# Patient Record
Sex: Male | Born: 1980
Health system: Southern US, Community
[De-identification: ages and names within clinical notes are randomized; demographics above are authoritative.]

## PROBLEM LIST (undated history)

## (undated) DIAGNOSIS — K219 Gastro-esophageal reflux disease without esophagitis: Secondary | ICD-10-CM

## (undated) DIAGNOSIS — F909 Attention-deficit hyperactivity disorder, unspecified type: Secondary | ICD-10-CM

## (undated) DIAGNOSIS — F419 Anxiety disorder, unspecified: Secondary | ICD-10-CM

## (undated) DIAGNOSIS — Z973 Presence of spectacles and contact lenses: Secondary | ICD-10-CM

## (undated) HISTORY — DX: Attention-deficit hyperactivity disorder, unspecified type: F90.9

## (undated) HISTORY — DX: Gastro-esophageal reflux disease without esophagitis: K21.9

## (undated) HISTORY — DX: Anxiety disorder, unspecified: F41.9

## (undated) HISTORY — PX: WISDOM TOOTH EXTRACTION: SHX21

---

## 2011-06-13 HISTORY — PX: SEPTOPLASTY: SUR1290

## 2017-04-12 ENCOUNTER — Encounter: Payer: Self-pay | Admitting: Family Medicine

## 2017-04-12 ENCOUNTER — Ambulatory Visit (INDEPENDENT_AMBULATORY_CARE_PROVIDER_SITE_OTHER): Payer: 59 | Admitting: Family Medicine

## 2017-04-12 VITALS — BP 115/64 | HR 88 | Temp 98.2°F | Resp 16 | Ht 66.0 in | Wt 193.8 lb

## 2017-04-12 DIAGNOSIS — J3089 Other allergic rhinitis: Secondary | ICD-10-CM

## 2017-04-12 DIAGNOSIS — K219 Gastro-esophageal reflux disease without esophagitis: Secondary | ICD-10-CM | POA: Diagnosis not present

## 2017-04-12 DIAGNOSIS — Z7689 Persons encountering health services in other specified circumstances: Secondary | ICD-10-CM

## 2017-04-12 DIAGNOSIS — J302 Other seasonal allergic rhinitis: Secondary | ICD-10-CM

## 2017-04-12 DIAGNOSIS — J309 Allergic rhinitis, unspecified: Secondary | ICD-10-CM | POA: Insufficient documentation

## 2017-04-12 DIAGNOSIS — Z9889 Other specified postprocedural states: Secondary | ICD-10-CM | POA: Insufficient documentation

## 2017-04-12 MED ORDER — FLUTICASONE PROPIONATE 50 MCG/ACT NA SUSP: 2.0000 | Freq: Every day | NASAL | 3 refills | Status: DC

## 2017-04-12 MED ORDER — OMEPRAZOLE 20 MG PO CPDR: 20.0000 mg | DELAYED_RELEASE_CAPSULE | Freq: Every day | ORAL | 5 refills | Status: DC

## 2017-04-12 NOTE — Assessment & Plan Note (Signed)
Stable with some gradual change s/p post op septoplasty for deviated septum [facility]

## 2017-04-12 NOTE — Assessment & Plan Note (Signed)
Suspected chronic contributing problem to his rhinitis, post nasal drainage and possibly related to his LPR  Plan: 1. Loratadine seems ineffective - switch to other 2nd gen anti-histamine, Zyrtec or Allegra (or generics OTC) daily 2. Start nasal steroid Flonase 2 sprays in each nostril daily for 4-6 weeks, may repeat course seasonally or as needed - need to use saline mist or spray or flush if using steroid to avoid bleeding/dryness - May need to switch nasal steroid to nasal anti-histamine in future if intolerance 

## 2017-04-12 NOTE — Assessment & Plan Note (Signed)
Suspected combination of silent reflux with laryngopharyngeal reflux and some mild GERD symptoms. Concern with chronic voice change and chronic post nasal drainage and phlegm throat clearing with heartburn after any type of PO at times - Primary risk includes former smoker, now E-cig user - No GI red flag symptoms (prior history of dysphagia resolved, prior eval by ENT direct laryngoscopy) - Inadequate trial of OTC H2 blocker zantac unsure dose x 1 daily for 1 month some improvement mixed results  Plan: 1. Start rx Omeprazole 20mg  daily 30 min prior to 1st meal for 4 weeks then notify office with update, most likely will need longer trial up to 3 months based on chronic LPR symptoms 2. Diet modifications reduce GERD 3. Follow-up 4 weeks as needed or if improved can return in 3 months, may consider prolonged course vs double dose for 40mg  vs BID - review taper off in future to avoid rebound - future if still problems can consider referral to GI

## 2017-04-12 NOTE — Assessment & Plan Note (Signed)
Suspected chronic contributing problem to his rhinitis, post nasal drainage and possibly related to his LPR  Plan: 1. Loratadine seems ineffective - switch to other 2nd gen anti-histamine, Zyrtec or Allegra (or generics OTC) daily 2. Start nasal steroid Flonase 2 sprays in each nostril daily for 4-6 weeks, may repeat course seasonally or as needed - need to use saline mist or spray or flush if using steroid to avoid bleeding/dryness - May need to switch nasal steroid to nasal anti-histamine in future if intolerance

## 2017-04-12 NOTE — Assessment & Plan Note (Signed)
Suspected combination of silent reflux with laryngopharyngeal reflux and some mild GERD symptoms. Concern with chronic voice change and chronic post nasal drainage and phlegm throat clearing with heartburn after any type of PO at times - Primary risk includes former smoker, now E-cig user - No GI red flag symptoms (prior history of dysphagia resolved, prior eval by ENT direct laryngoscopy) - Inadequate trial of OTC H2 blocker zantac unsure dose x 1 daily for 1 month some improvement mixed results  Plan: 1. Start rx Omeprazole 20mg daily 30 min prior to 1st meal for 4 weeks then notify office with update, most likely will need longer trial up to 3 months based on chronic LPR symptoms 2. Diet modifications reduce GERD 3. Follow-up 4 weeks as needed or if improved can return in 3 months, may consider prolonged course vs double dose for 40mg vs BID - review taper off in future to avoid rebound - future if still problems can consider referral to GI 

## 2017-04-12 NOTE — Patient Instructions (Addendum)
Thank you for coming to the clinic today.  1. Your symptoms sound most consistent with Silent Reflux or (Laryngopharyngeal Reflux), this is similar to Acid Reflux (or GERD) but usually involves the Throat and has a variety of symptoms including a "globus sensation", or air bubble or pressure in throat. Commonly occurs in patients who have had some symptoms of traditional heartburn before, but this can occur even when not eating spicy foods or any other foods can trigger it like you mentioned  - Start Omeprazole 20mg  - take one capsule 30 min before first meal of day, same time every day for at least 4 weeks, given the duration of your symptoms, I think this is reasonable to continue for likely up to 3 months until you return to office  - Avoid spicy, greasy, fried foods, also things like caffeine, dark chocolate, peppermint can worsen - Avoid large meals and late night snacks, also do not go more than 4-5 hours without a snack or meal (not eating will worsen reflux symptoms due to stomach acid)  If the problem improves but keeps coming back, we can discuss higher dose or longer course at next visit.  If symptoms are worsening, persistent symptoms despite treatment or develop esophageal or abdominal pain, unable to swallow solids or liquids, nausea, vomiting, fever/chills, or unintentional weight loss / no appetite, please follow-up sooner or seek more immediate medical attention.  For sinuses  Start nasal steroid Flonase 2 sprays in each nostril daily for 4-6 weeks, may repeat course seasonally or as needed  STOP Loratadine (Claritin) switch to OTC Zyrtec or Allegra once daily  ------------------- Please send us a copy of your [facility]Chart [facility] lab results  We will request records from   [facility] [address] [place], [place] [address] Main Phone: [phone] Fax: [phone]  Please schedule a Follow-up Appointment to: Return in about 3 months (around 07/13/2017) for GERD /  Rhinitis sinuses.  If you have any other questions or concerns, please feel free to call the clinic or send a message through MyChart. You may also schedule an earlier appointment if necessary.  Additionally, you may be receiving a survey about your experience at our clinic within a few days to 1 week by e-mail or mail. We value your feedback.  Saralyn PilarAlexander Earnie Bechard, DO [facility]

## 2017-04-12 NOTE — Progress Notes (Signed)
Subjective:    Patient ID: Robert [name], male    DOB: 08-17-1980, 36 y.o.   MRN: [id number]  Robert [name] is a 36 y.o. male presenting on 04/12/2017 for Establish Care (GERD, Allergies ear pressure and dizziness after work out.)  [facility] in [place], last visit >1 year ago, completed all blood work and evaluation. He is establishing now with new PCP here after changing jobs and with new insurance plan and relocated to [place].  HPI   GERD vs LPR - Reports history onset 3-4 years ago onset of symptoms with dietary/GERD (previously could eat most foods, chocolate, coffee, dairy without problem), describes of difficulty swallowing and choking on food, also with associated heartburn, he initially was referred to [facility], had a direct laryngoscopy, confirmed some irritation of throat was dx with LPR silent reflux, initially treated with Zantac 150mg  once daily not taking twice daily, seemed to feel better for a while, he stopped taking it regularly >2-3 years ago, and now he only takes it PRN, seems to benefit when he does take it PRN and helps "clear his throat", he has never taken PPI medicines. - Describes most foods and liquids seem to provoke his symptoms. He tries to avoid most trigger foods spicy etc, but seems to be caused by even water. Worse when laying down at night, will have esophageal or upper heartburn burning symptoms, less when upright. - Currently taking 1000mg  TUMs with some relief temporarily  Deviated septum, S/p Septoplasty - Reports had chronic history of nasal difficulties, he required surgery with deviated septum and septoplasty by [facility] - He does admit some "shifting" and slight change but has been in communication with ENT and no changes needed now  Seasonal Allergies / Allergic Rhinitis Reports chronic problem, worst seasons summer/fall - He took claritin 10mg  OTC for 3-4 days then would get some sinus pressure, and nasal congestion - Most annoying thing with  difficulty breathing through nose, and will get phlegm in back of throat, post nasal drainage, thicker sticky phlegm - Uses nasal wash regularly  Lifestyle: - Fluctuating weight over past 5 months due to lifestyle changes - Diet: Follows shake program isogenic, dairy free, higher protein, smaller carb/veggie portion, drinks only water - Exercise: Previously working night shift, he would work out at [employer], now more difficult to keep up with gym now while working on day shift. He does work out 1-3 x weekly, regular weight training 10 min intense cardio machine, weight lifting  History of Tobacco Abuse - Reports prior smoker 1ppd for 15 years, Quit smoking cigs 10/2016. Social vaping now  Health Maintenance: - Due for Flu Shot, would return soon, declines today despite counseling on benefits - Will check CareEverywhere/outside PCP records pending for TDap and routine HIV screen  Depression screen PHQ 2/9 04/12/2017  Decreased Interest 0  Down, Depressed, Hopeless 0  PHQ - 2 Score 0    Past Medical History:  Diagnosis Date  . ADHD   . GERD (gastroesophageal reflux disease)    Past Surgical History:  Procedure Laterality Date  . SEPTOPLASTY  06/13/2011   Social History   Social History  . Marital status: Married    Spouse name: N/A  . Number of children: 1  . Years of education: Vocational   Occupational History  . IT     Commuting to [place]   Social History Main Topics  . Smoking status: Former Smoker    Packs/day: 1.00    Years: 15.00  Types: Cigarettes    Quit date: 10/10/2016  . Smokeless tobacco: Former Neurosurgeon     Comment: Quit using vaping  . Alcohol use No  . Drug use: No  . Sexual activity: Not on file   Other Topics Concern  . Not on file   Social History Narrative  . No narrative on file   Family History  Problem Relation Age of Onset  . Cancer Maternal Grandfather 84       stomach  . Esophageal cancer Maternal Grandfather 34  . Colon  cancer Neg Hx   . Prostate cancer Neg Hx   . Diabetes Neg Hx   . Hypertension Neg Hx   . Hypercholesterolemia Neg Hx    No current outpatient prescriptions on file prior to visit.   No current facility-administered medications on file prior to visit.     Review of Systems  Constitutional: Negative for activity change, appetite change, chills, diaphoresis, fatigue, fever and unexpected weight change.  HENT: Positive for congestion, postnasal drip and voice change. Negative for hearing loss and sinus pressure.   Eyes: Negative for discharge, itching and visual disturbance.  Respiratory: Negative for apnea, cough, choking, chest tightness, shortness of breath and wheezing.   Cardiovascular: Negative for chest pain, palpitations and leg swelling.  Gastrointestinal: Negative for abdominal pain, anal bleeding, blood in stool, constipation, diarrhea, nausea and vomiting.       Heartburn  Endocrine: Negative for cold intolerance and polyuria.  Genitourinary: Negative for decreased urine volume, difficulty urinating, dysuria, frequency, hematuria and testicular pain.  Musculoskeletal: Negative for arthralgias and neck pain.  Skin: Negative for rash.  Allergic/Immunologic: Positive for environmental allergies.  Neurological: Negative for dizziness, weakness, light-headedness, numbness and headaches.  Hematological: Negative for adenopathy.  Psychiatric/Behavioral: Negative for behavioral problems, dysphoric mood and sleep disturbance. The patient is not nervous/anxious.    Per HPI unless specifically indicated above     Objective:    BP 115/64   Pulse 88   Temp 98.2 F (36.8 C) (Oral)   Resp 16   Ht 5\' 6"  (1.676 m)   Wt 193 lb 12.8 oz (87.9 kg)   BMI 31.28 kg/m   Wt Readings from Last 3 Encounters:  04/12/17 193 lb 12.8 oz (87.9 kg)    Physical Exam  Constitutional: He is oriented to person, place, and time. He appears well-developed and well-nourished. No distress.    Well-appearing, comfortable, cooperative  HENT:  Head: Normocephalic and atraumatic.  Mouth/Throat: Oropharynx is clear and moist.  Frontal / maxillary sinuses non-tender. Nares mostly patent with some narrowing deeper turbinates without congestion. Oropharynx clear without erythema, exudates, edema or asymmetry.  Eyes: Conjunctivae are normal. Right eye exhibits no discharge. Left eye exhibits no discharge.  Neck: Normal range of motion. Neck supple. No thyromegaly present.  Cardiovascular: Normal rate and intact distal pulses.   Pulmonary/Chest: Effort normal.  Musculoskeletal: He exhibits no edema.  Lymphadenopathy:    He has no cervical adenopathy.  Neurological: He is alert and oriented to person, place, and time.  Skin: Skin is warm and dry. No rash noted. He is not diaphoretic. No erythema.  Psychiatric: He has a normal mood and affect. His behavior is normal.  Well groomed, good eye contact, normal speech and thoughts  Nursing note and vitals reviewed.  No results found for this or any previous visit.    Assessment & Plan:   Problem List Items Addressed This Visit    Allergic rhinitis    Suspected chronic  contributing problem to his rhinitis, post nasal drainage and possibly related to his LPR  Plan: 1. Loratadine seems ineffective - switch to other 2nd gen anti-histamine, Zyrtec or Allegra (or generics OTC) daily 2. Start nasal steroid Flonase 2 sprays in each nostril daily for 4-6 weeks, may repeat course seasonally or as needed - need to use saline mist or spray or flush if using steroid to avoid bleeding/dryness - May need to switch nasal steroid to nasal anti-histamine in future if intolerance      Relevant Medications   fluticasone (FLONASE) 50 MCG/ACT nasal spray   GERD (gastroesophageal reflux disease) - Primary    Suspected combination of silent reflux with laryngopharyngeal reflux and some mild GERD symptoms. Concern with chronic voice change and chronic post  nasal drainage and phlegm throat clearing with heartburn after any type of PO at times - Primary risk includes former smoker, now E-cig user - No GI red flag symptoms (prior history of dysphagia resolved, prior eval by ENT direct laryngoscopy) - Inadequate trial of OTC H2 blocker zantac unsure dose x 1 daily for 1 month some improvement mixed results  Plan: 1. Start rx Omeprazole 20mg  daily 30 min prior to 1st meal for 4 weeks then notify office with update, most likely will need longer trial up to 3 months based on chronic LPR symptoms 2. Diet modifications reduce GERD 3. Follow-up 4 weeks as needed or if improved can return in 3 months, may consider prolonged course vs double dose for 40mg  vs BID - review taper off in future to avoid rebound - future if still problems can consider referral to GI      Relevant Medications   omeprazole (PRILOSEC) 20 MG capsule   Laryngopharyngeal reflux (LPR)    Suspected combination of silent reflux with laryngopharyngeal reflux and some mild GERD symptoms. Concern with chronic voice change and chronic post nasal drainage and phlegm throat clearing with heartburn after any type of PO at times - Primary risk includes former smoker, now E-cig user - No GI red flag symptoms (prior history of dysphagia resolved, prior eval by ENT direct laryngoscopy) - Inadequate trial of OTC H2 blocker zantac unsure dose x 1 daily for 1 month some improvement mixed results  Plan: 1. Start rx Omeprazole 20mg  daily 30 min prior to 1st meal for 4 weeks then notify office with update, most likely will need longer trial up to 3 months based on chronic LPR symptoms 2. Diet modifications reduce GERD 3. Follow-up 4 weeks as needed or if improved can return in 3 months, may consider prolonged course vs double dose for 40mg  vs BID - review taper off in future to avoid rebound - future if still problems can consider referral to GI      Relevant Medications   omeprazole (PRILOSEC) 20 MG  capsule   S/P nasal septoplasty    Stable with some gradual change s/p post op septoplasty for deviated septum [facility]      Seasonal allergies    Suspected chronic contributing problem to his rhinitis, post nasal drainage and possibly related to his LPR  Plan: 1. Loratadine seems ineffective - switch to other 2nd gen anti-histamine, Zyrtec or Allegra (or generics OTC) daily 2. Start nasal steroid Flonase 2 sprays in each nostril daily for 4-6 weeks, may repeat course seasonally or as needed - need to use saline mist or spray or flush if using steroid to avoid bleeding/dryness - May need to switch nasal  steroid to nasal anti-histamine in future if intolerance       Other Visit Diagnoses    Encounter to establish care with new doctor          Meds ordered this encounter  Medications  . omeprazole (PRILOSEC) 20 MG capsule    Sig: Take 1 capsule (20 mg total) by mouth daily before breakfast.    Dispense:  30 capsule    Refill:  5  . fluticasone (FLONASE) 50 MCG/ACT nasal spray    Sig: Place 2 sprays into both nostrils daily. Use for 4-6 weeks then stop and use seasonally or as needed.    Dispense:  16 g    Refill:  3    Follow up plan: Return in about 3 months (around 07/13/2017) for GERD / Rhinitis sinuses.  Saralyn Pilar, DO [facility] 04/12/2017, 4:22 PM

## 2017-04-27 DIAGNOSIS — M531 Cervicobrachial syndrome: Secondary | ICD-10-CM | POA: Diagnosis not present

## 2017-04-27 DIAGNOSIS — M9903 Segmental and somatic dysfunction of lumbar region: Secondary | ICD-10-CM | POA: Diagnosis not present

## 2017-04-27 DIAGNOSIS — M9901 Segmental and somatic dysfunction of cervical region: Secondary | ICD-10-CM | POA: Diagnosis not present

## 2017-05-16 ENCOUNTER — Telehealth: Payer: Self-pay | Admitting: Family Medicine

## 2017-05-16 NOTE — Telephone Encounter (Signed)
Last seen as new patient 1 month ago, called patient to check on status and progress of symptoms on PPI. Unable to reach patient, LVM instead for him to call back if questions. Additionally emphasized again the importance of modifying factors such as raising the head of the bed. We can review further or answer questions if he needs anything, otherwise if he is improved and symptoms controlled and no questions then we can follow-up as scheduled in February 2019.  Saralyn Pilar, DO [facility] 05/16/2017, 9:15 AM

## 2017-05-17 DIAGNOSIS — M9903 Segmental and somatic dysfunction of lumbar region: Secondary | ICD-10-CM | POA: Diagnosis not present

## 2017-05-17 DIAGNOSIS — M9901 Segmental and somatic dysfunction of cervical region: Secondary | ICD-10-CM | POA: Diagnosis not present

## 2017-05-17 DIAGNOSIS — M531 Cervicobrachial syndrome: Secondary | ICD-10-CM | POA: Diagnosis not present

## 2017-05-18 ENCOUNTER — Telehealth: Payer: Self-pay | Admitting: Family Medicine

## 2017-05-18 NOTE — Telephone Encounter (Signed)
Pt. Called  States that treatment did not go as  Plan . Pt call back  # [phone]

## 2017-05-18 NOTE — Telephone Encounter (Signed)
Called patient back. See last telephone note dated 12/5 and last office visit.  1. GERD LPR - He is taking Omeprazole rx 20mg  daily, significant relief in his reflux symptoms overall, but now states he has more bloating after eating, still has some reflux occasionally. He has not tried elevating head of bed will consider this, feels better when stands up often in evening if has symptoms. I advised him to may try tapering off PPI now if symptoms under control, maybe he does not need it long-term, can do every other day for 1-2 weeks to taper off, see if bloating improves, otherwise may continue and try symptom relief with Gas-x, or probiotic, or peppermint oil OTC  2. Post nasal drainage / Rhinitis - Flonase helped after 2-3 weeks of use, but then he got URI sinus infection had a nose bleed and stopped, he will reconsider use, I advised at least 4-6 weeks again then may stop. Continue nasal saline.  Keep follow-up apt 07/13/17  Saralyn Pilar, DO [facility] 05/18/2017, 5:24 PM

## 2017-05-20 ENCOUNTER — Encounter: Payer: Self-pay | Admitting: Family Medicine

## 2017-05-22 ENCOUNTER — Encounter: Payer: Self-pay | Admitting: Gastroenterology

## 2017-05-22 ENCOUNTER — Ambulatory Visit: Payer: Self-pay | Admitting: Gastroenterology

## 2017-05-22 ENCOUNTER — Other Ambulatory Visit: Payer: Self-pay

## 2017-05-22 ENCOUNTER — Encounter: Payer: Self-pay | Admitting: Family Medicine

## 2017-05-22 ENCOUNTER — Ambulatory Visit (INDEPENDENT_AMBULATORY_CARE_PROVIDER_SITE_OTHER): Payer: 59 | Admitting: Gastroenterology

## 2017-05-22 VITALS — BP 115/76 | HR 91 | Temp 97.5°F | Ht 66.0 in | Wt 197.2 lb

## 2017-05-22 DIAGNOSIS — K219 Gastro-esophageal reflux disease without esophagitis: Secondary | ICD-10-CM

## 2017-05-22 DIAGNOSIS — R1310 Dysphagia, unspecified: Secondary | ICD-10-CM | POA: Diagnosis not present

## 2017-05-22 DIAGNOSIS — K2100 Gastro-esophageal reflux disease with esophagitis, without bleeding: Secondary | ICD-10-CM

## 2017-05-23 ENCOUNTER — Ambulatory Visit (INDEPENDENT_AMBULATORY_CARE_PROVIDER_SITE_OTHER): Payer: 59 | Admitting: Family Medicine

## 2017-05-23 ENCOUNTER — Encounter: Payer: Self-pay | Admitting: Family Medicine

## 2017-05-23 VITALS — BP 105/67 | HR 95 | Temp 98.3°F | Resp 16 | Ht 66.0 in | Wt 198.0 lb

## 2017-05-23 DIAGNOSIS — Z87898 Personal history of other specified conditions: Secondary | ICD-10-CM | POA: Diagnosis not present

## 2017-05-23 DIAGNOSIS — F4322 Adjustment disorder with anxiety: Secondary | ICD-10-CM

## 2017-05-23 DIAGNOSIS — J019 Acute sinusitis, unspecified: Secondary | ICD-10-CM

## 2017-05-23 DIAGNOSIS — J3089 Other allergic rhinitis: Secondary | ICD-10-CM | POA: Diagnosis not present

## 2017-05-23 DIAGNOSIS — K219 Gastro-esophageal reflux disease without esophagitis: Secondary | ICD-10-CM | POA: Diagnosis not present

## 2017-05-23 MED ORDER — IPRATROPIUM BROMIDE 0.06 % NA SOLN: 2.0000 | Freq: Four times a day (QID) | NASAL | 0 refills | Status: DC

## 2017-05-23 MED ORDER — AMOXICILLIN-POT CLAVULANATE 875-125 MG PO TABS: 1.0000 | ORAL_TABLET | Freq: Two times a day (BID) | ORAL | 0 refills | Status: DC

## 2017-05-23 NOTE — Patient Instructions (Addendum)
Thank you for coming to the clinic today.  1. Continue Dexilant per Dr Servando Snare GI, and continue with plan  2. Will cover sinuses with Augmentin 1 pill twice daily with food for 10 days - only start within 48 hours if not improved.  Start Atrovent nasal spray decongestant 2 sprays in each nostril up to 4 times daily for 7 days  3. May establish with Psychology or therapist at work   Psych Counseling ONLY  Self Referral:  1. [facility]   Address: [address] [place], [place], [place] [address] Hours: Open today  9AM-7PM Phone: [phone]  2. Anell Barr [employer] Address: [address] [place], [place] [address] Phone: [phone]   BOTH [facility] + COUNSELING  Self Referral [facility] [place] [address], [place], [place] [address] Phone: [phone]  [employer], available walk-in 9am-4pm M-F [address] [place], [place] [address] Hours: 9am - 4pm (M-F, walk in available) [phone] - Offers substance abuse treatment including suboxone)  ----------------------------------------------  [facility]All [facility]) [address], [place], [id number] Phone: [phone] (Option 1) [email or web address]  -------------------------------------------------------------------------------------------------------------------  1. You have symptoms of Vertigo (Benign Paroxysmal Positional Vertigo) - This is commonly caused by inner ear fluid imbalance, sometimes can be worsened by allergies and sinus symptoms, otherwise it can occur randomly sometimes and we may never discover the exact cause. - To treat this, try the Epley Manuever (see diagrams/instructions below) at home up to 3 times a day for 1-2 weeks or until symptoms resolve - You may take Meclizine as needed up to 3 times a day for dizziness, this will not cure symptoms but may help.  Caution may make you drowsy.  If you develop significant worsening episode with vertigo that does not improve and you get severe headache, loss of vision, arm or leg weakness, slurred speech, or other concerning symptoms please seek immediate medical attention at Emergency Department.  See the next page for images describing the Epley Manuever.     ----------------------------------------------------------------------------------------------------------------------       Please schedule a Follow-up Appointment to: Return in about 2 weeks (around 06/06/2017), or if symptoms worsen or fail to improve, for sinus, acid reflux, anxiety, dizziness.  If you have any other questions or concerns, please feel free to call the clinic or send a message through MyChart. You may also schedule an earlier appointment if necessary.  Additionally, you may be receiving a survey about your experience at our clinic within a few days to 1 week by e-mail or mail. We value your feedback.  Saralyn Pilar, DO [facility], [place]

## 2017-05-23 NOTE — Progress Notes (Signed)
Gastroenterology Consultation  Referring Provider:     Saralyn PilarKaramalegos, Alexander * Primary Care Physician:  Smitty CordsKaramalegos, Alexander J, DO Primary Gastroenterologist:  Dr. Servando SnareWohl     Reason for Consultation:     GERD        HPI:   Robert [name] is a 36 y.o. y/o male referred for consultation & management of GERD by Dr. Althea CharonKaramalegos, Netta NeatAlexander J, DO.  This patient comes today with a history of GERD and abdominal pain under his left rib on the lower costal margin. The patient reports that he was started on omeprazole and he didn't notice much help but he states he was only 20 mg. The patient then reports that he ran out of it and hadn't refilled that shortly after having a refilled she had an episode of severe heartburn with feeling of his throat closing up. The patient takes Tums and this helps his symptoms. He states he gets these attacks where he feels like he can't swallow anything in his throat is closing up. He reports that it lasts for a couple of minutes but is very concerning. There is no report of any unexplained weight loss black stools or bloody stools. He also denies any nausea or vomiting. There is no report of any food having to come up after he swallows it or the food getting stuck on the way down.    Past Medical History:  Diagnosis Date  . ADHD   . GERD (gastroesophageal reflux disease)     Past Surgical History:  Procedure Laterality Date  . SEPTOPLASTY  06/13/2011    Prior to Admission medications   Medication Sig Start Date End Date Taking? Authorizing Provider  omeprazole (PRILOSEC) 20 MG capsule Take 1 capsule (20 mg total) by mouth daily before breakfast. 04/12/17  Yes Karamalegos, Alexander J, DO  fluticasone (FLONASE) 50 MCG/ACT nasal spray Place 2 sprays into both nostrils daily. Use for 4-6 weeks then stop and use seasonally or as needed. Patient not taking: Reported on 05/22/2017 04/12/17   Smitty CordsKaramalegos, Alexander J, DO    Family History  Problem Relation Age of  Onset  . Cancer Maternal Grandfather 84       stomach  . Esophageal cancer Maternal Grandfather [id number]  . Colon cancer Neg Hx   . Prostate cancer Neg Hx   . Diabetes Neg Hx   . Hypertension Neg Hx   . Hypercholesterolemia Neg Hx      Social History   Tobacco Use  . Smoking status: Former Smoker    Packs/day: 1.00    Years: 15.00    Pack years: 15.00    Types: Cigarettes    Last attempt to quit: 10/10/2016    Years since quitting: 0.6  . Smokeless tobacco: Never Used  . Tobacco comment: Quit using vaping  Substance Use Topics  . Alcohol use: No  . Drug use: No    Allergies as of 05/22/2017  . (No Known Allergies)    Review of Systems:    All systems reviewed and negative except where noted in HPI.   Physical Exam:  BP 115/76   Pulse 91   Temp (!) 97.5 F (36.4 C) (Oral)   Ht 5\' 6"  (1.676 m)   Wt 197 lb 3.2 oz (89.4 kg)   BMI 31.83 kg/m  No LMP for male patient. Psych:  Alert and cooperative. Normal mood and affect. General:   Alert,  Well-developed, well-nourished, pleasant and cooperative in NAD Head:  Normocephalic and atraumatic. Eyes:  Sclera clear, no icterus.   Conjunctiva pink. Ears:  Normal auditory acuity. Nose:  No deformity, discharge, or lesions. Mouth:  No deformity or lesions,oropharynx pink & moist. Neck:  Supple; no masses or thyromegaly. Lungs:  Respirations even and unlabored.  Clear throughout to auscultation.   No wheezes, crackles, or rhonchi. No acute distress. Heart:  Regular rate and rhythm; no murmurs, clicks, rubs, or gallops. Abdomen:  Normal bowel sounds.  No bruits.  Soft, mild tenderness in the left upper quadrant over the stomach area. Non-distended without masses, hepatosplenomegaly or hernias noted.  No guarding or rebound tenderness.  Negative Carnett sign.   Rectal:  Deferred.  Msk:  Symmetrical without gross deformities.  Good, equal movement & strength bilaterally. Pulses:  Normal pulses noted. Extremities:  No clubbing or  edema.  No cyanosis. Neurologic:  Alert and oriented x3;  grossly normal neurologically. Skin:  Intact without significant lesions or rashes.  No jaundice. Lymph Nodes:  No significant cervical adenopathy. Psych:  Alert and cooperative. Normal mood and affect.  Imaging Studies: No results found.  Assessment and Plan:   Robert [name] is a 36 y.o. y/o male who comes in with chronic reflux symptoms and he reports that his throat feels like is closing up when he has attacks of the reflux. He states that the symptoms are worse in the afternoon and evening but not at nighttime. He does report that he has some reflux at night. The patient has been told to stop the Prilosec for which he states he did stop it recently because she got sick after the first pill of the new refill. The patient will be given samples of Dexilant. He will let us know if this helps him and we'll then begin a prescription. The patient will also be set up for an upper endoscopy to look at the esophagus and stomach to see if there is any cause of his abdominal pain.  Midge Minium, MD. [facility]   Note: This dictation was prepared with Dragon dictation along with smaller phrase technology. Any transcriptional errors that result from this process are unintentional.

## 2017-05-23 NOTE — Progress Notes (Signed)
Subjective:    Patient ID: Robert [name], male    DOB: 03/05/1981, 36 y.o.   MRN: [id number]  Robert [name] is a 36 y.o. male presenting on 05/23/2017 for Sinus Problem (onset week,stuffy nose, ear pain)  Patient presents for a same day appointment.  HPI   FOLLOW-UP GERD - Last visit with me 04/12/17 for new patient visit for same problem consistent with LPR, treated with new start PPI 20mg  daily, see prior notes for background information. - Interval update with initial improvement for 1 month then worsening symptoms upon refill (states the brand changed), see telephone note 12/5 and 12/7, and mychart email 12/9, 12/11 for correspondence and recommendations. He seemed to provide some conflicting information seemed improved but then worse, I offered to increase dose to new rx 40mg  daily for trial, and future we would consider referral to GI vs ENT, as he has a complex history of prior [facility] ENT and sinus surgery - Recent update he self scheduled with [facility] Dr Servando Snare and was seen in consult on 05/22/17, switched PPI to Dexilant samples higher dose, and scheduled EGD 12/21 - Today patient reports dramatic improvement on higher dose Dexilant in already 24 hours, continues on this for now, and plans to get rx to continue treatment plan. - He will try to improve diet as well - Plans for future EGD - He thinks anxiety may be contributing to reflux symptoms, and also thinks sinus and allergies also playing role - Denies abdominal pain, nausea vomiting, unintentional weight loss, early satiety, fever chills  Sinusitis / Seasonal Allergies / Rhinitis - Last visit with me 11/1, for initial visit for same problem with allergies rhinitis history of ENT sinus surgery, treated with Flonase as thought sinus drainage and congestion contributing to reflux throat symptoms, see prior notes for background information. - Interval update with limited improvement on Flonase, actually caused epistaxis and he got sinus  infection recently so stopped - Today patient reports concern with worsening sinus symptoms over past 1 week, now off Flonase 2-3 weeks. Has worsening thicker sinus congestion, sinus pressure and ear pain pressure - No other OTC meds tried - Admits some dizziness, seems to be chronic problem, has been dx with Vertigo in past, relieved with Epley maneuver - Denies fever, lightheadedness, fall  Anxiety vs Adjustment Disorder - patient reports that this was brought to his attention by his wife after he had an episodes in past with concern for anxiety spells, and today had severe episode,did not feel good at work, told to go home, had difficulty with rapid breathing, and tingling in both arms, sudden onset, hands felt numb, left eye twitched for < 20 min then resolved completely, usually when has anxiety he walks around and gets away and does breathing techniques - He has not been formally dx with anxiety or depression - Has not seen Psychology or Psychiatry or counseling - Never on SSRI or medications for this - He is not interested in psychotropic medicines - He thinks winter season makes his symptoms worse, states "seasonal" problem - Denies chest pain or pressure, sweating episode, exertional symptoms, suicidal or homicidal ideation  Depression screen [facility] 2/9 04/12/2017  Decreased Interest 0  Down, Depressed, Hopeless 0  PHQ - 2 Score 0   GAD 7 : Generalized Anxiety Score 05/23/2017  Nervous, Anxious, on Edge 1  Control/stop worrying 0  Worry too much - different things 2  Trouble relaxing 3  Restless 3  Easily annoyed or irritable 0  Afraid -  awful might happen 1  Total GAD 7 Score 10  Anxiety Difficulty Somewhat difficult    Social History   Tobacco Use  . Smoking status: Former Smoker    Packs/day: 1.00    Years: 15.00    Pack years: 15.00    Types: Cigarettes    Last attempt to quit: 10/10/2016    Years since quitting: 0.6  . Smokeless tobacco: Never Used  . Tobacco  comment: Quit using vaping  Substance Use Topics  . Alcohol use: No  . Drug use: No    Review of Systems Per HPI unless specifically indicated above     Objective:    BP 105/67   Pulse 95   Temp 98.3 F (36.8 C) (Oral)   Resp 16   Ht 5\' 6"  (1.676 m)   Wt 198 lb (89.8 kg)   SpO2 98%   BMI 31.96 kg/m   Wt Readings from Last 3 Encounters:  05/23/17 198 lb (89.8 kg)  05/22/17 197 lb 3.2 oz (89.4 kg)  04/12/17 193 lb 12.8 oz (87.9 kg)    Physical Exam  Constitutional: He is oriented to person, place, and time. He appears well-developed and well-nourished. No distress.  Well-appearing, comfortable, cooperative  HENT:  Head: Normocephalic and atraumatic.  Mouth/Throat: Oropharynx is clear and moist.  Frontal / maxillary sinuses non-tender. Nares mostly patent with some narrowing deeper turbinates with some mild congestion today without purulence.   Bilateral TM mild clear effusion R>L without purulence, erythema or complication.  Oropharynx mild generalized posterior erythema without drainage and exudates, edema or asymmetry.  Eyes: Conjunctivae are normal. Right eye exhibits no discharge. Left eye exhibits no discharge.  Neck: Normal range of motion. Neck supple.  Cardiovascular: Normal rate, regular rhythm, normal heart sounds and intact distal pulses.  Pulmonary/Chest: Effort normal and breath sounds normal. No respiratory distress. He has no wheezes. He has no rales.  Musculoskeletal: He exhibits no edema.  Lymphadenopathy:    He has no cervical adenopathy.  Neurological: He is alert and oriented to person, place, and time.  Distal sensation to light touch intact upper extremity fingers  Skin: Skin is warm and dry. No rash noted. He is not diaphoretic. No erythema.  Psychiatric: He has a normal mood and affect. His behavior is normal.  Well groomed, good eye contact, normal speech and thoughts  Nursing note and vitals reviewed.  No results found for this or any  previous visit.    Assessment & Plan:   Problem List Items Addressed This Visit    Allergic rhinitis    Likely contributing to sinusitis symptoms Trial on Atrovent Hold flonase for now due to epistaxis      GERD (gastroesophageal reflux disease) - Primary    Improved on PPI, initially then relapse, now again improved on higher potency Dexilant per GI - See prior note, consistent with LPR. Smoking history risk - Prior history without significant GI red flags, however seems to endorse some difficulty with swallowing at times as well, has constellation of symptoms - Now followed by [facility] Dr Servando Snare - has EGD scheduled 12/21 - Likely worsened with anxiety, sinusitis/rhinitis  Plan: 1. Continue higher dose PPI Dexilant through GI for now - may taper down in future if need once controlled 2. Proceed with EGD given constellation of symptoms 3. Treat sinus, reviewed anxiety goal to control in future 4. Follow-up as planned within 2 months      History of vertigo    Some intermittent vertigo  BPPV symptoms, prior dx Suspect related to [facility] issues [facility] Advised Epley maneuver at home, future consider vestibular rehab if need due to chronicity of problem, may need ENT again in future      Laryngopharyngeal reflux (LPR)    Improved on PPI, initially then relapse, now again improved on higher potency Dexilant per GI - See prior note, consistent with LPR. Smoking history risk - Prior history without significant GI red flags, however seems to endorse some difficulty with swallowing at times as well, has constellation of symptoms - Now followed by [facility] Dr Servando SnareWohl - has EGD scheduled 12/21 - Likely worsened with anxiety, sinusitis/rhinitis  Plan: 1. Continue higher dose PPI Dexilant through GI for now - may taper down in future if need once controlled 2. Proceed with EGD given constellation of symptoms 3. Treat sinus, reviewed anxiety goal to control in future 4. Follow-up as planned  within 2 months       Other Visit Diagnoses    Acute rhinosinusitis     Concern possible progression to bacterial sinusitis given duration >1 week, seems worsening - Trial on antibiotic Augmentin Start Atrovent nasal spray decongestant 2 sprays in each nostril up to 4 times daily for 7 days    Relevant Medications   ipratropium (ATROVENT) 0.06 % nasal spray   amoxicillin-clavulanate (AUGMENTIN) 875-125 MG tablet   Adjustment disorder with anxiety     Suspected new dx anxiety vs adjustment seems related to health issues and life stressors, may not have actual GAD dx, affecting him now with panic attack symptoms now by report, insomnia PHQ negative but GAD 10 and variable difficulty - he has difficulty answering questions - No prior medications not interested - No prior dx / Psych / counseling  Plan: 1. Discussion on new diagnosis adjustment disorder vs anxiety, management, complications,  - Declines starting medication - I agree for now, advised we should start with controlling GERD/LPR to ease anxiety and stress, and treat sinuses, reviewed work on breathing exercises and de-stress technique - Agree with his own plan to seek help counseling and therapy through work, handout given self referral information - Follow-up as planned 1-2 months for follow-up PHQ/GAD, consider meds         Meds ordered this encounter  Medications  . ipratropium (ATROVENT) 0.06 % nasal spray    Sig: Place 2 sprays into both nostrils 4 (four) times daily. For up to 5-7 days then stop.    Dispense:  15 mL    Refill:  0  . amoxicillin-clavulanate (AUGMENTIN) 875-125 MG tablet    Sig: Take 1 tablet by mouth 2 (two) times daily.    Dispense:  20 tablet    Refill:  0    Follow up plan: Return in about 2 weeks (around 06/06/2017), or if symptoms worsen or fail to improve, for sinus, acid reflux, anxiety, dizziness.   Already scheduled for 07/2017  Saralyn PilarAlexander Raha Tennison, DO [facility] 05/24/2017, 12:17 AM

## 2017-05-24 DIAGNOSIS — Z87898 Personal history of other specified conditions: Secondary | ICD-10-CM | POA: Insufficient documentation

## 2017-05-24 NOTE — Assessment & Plan Note (Signed)
Improved on PPI, initially then relapse, now again improved on higher potency Dexilant per GI - See prior note, consistent with LPR. Smoking history risk - Prior history without significant GI red flags, however seems to endorse some difficulty with swallowing at times as well, has constellation of symptoms - Now followed by [facility] Dr Servando Snare - has EGD scheduled 12/21 - Likely worsened with anxiety, sinusitis/rhinitis  Plan: 1. Continue higher dose PPI Dexilant through GI for now - may taper down in future if need once controlled 2. Proceed with EGD given constellation of symptoms 3. Treat sinus, reviewed anxiety goal to control in future 4. Follow-up as planned within 2 months

## 2017-05-24 NOTE — Assessment & Plan Note (Signed)
Some intermittent vertigo BPPV symptoms, prior dx Suspect related to sinusitis issues [facility] Advised Epley maneuver at home, future consider vestibular rehab if need due to chronicity of problem, may need ENT again in future

## 2017-05-24 NOTE — Assessment & Plan Note (Signed)
Likely contributing to sinusitis symptoms Trial on Atrovent Hold flonase for now due to epistaxis

## 2017-05-24 NOTE — Assessment & Plan Note (Signed)
Improved on PPI, initially then relapse, now again improved on higher potency Dexilant per GI - See prior note, consistent with LPR. Smoking history risk - Prior history without significant GI red flags, however seems to endorse some difficulty with swallowing at times as well, has constellation of symptoms - Now followed by [facility] Dr Wohl - has EGD scheduled 12/21 - Likely worsened with anxiety, sinusitis/rhinitis  Plan: 1. Continue higher dose PPI Dexilant through GI for now - may taper down in future if need once controlled 2. Proceed with EGD given constellation of symptoms 3. Treat sinus, reviewed anxiety goal to control in future 4. Follow-up as planned within 2 months 

## 2017-05-28 ENCOUNTER — Encounter: Payer: Self-pay | Admitting: Family Medicine

## 2017-05-28 ENCOUNTER — Encounter: Payer: Self-pay | Admitting: *Deleted

## 2017-05-28 ENCOUNTER — Other Ambulatory Visit: Payer: Self-pay

## 2017-05-31 ENCOUNTER — Ambulatory Visit (INDEPENDENT_AMBULATORY_CARE_PROVIDER_SITE_OTHER): Payer: 59 | Admitting: Psychology

## 2017-05-31 ENCOUNTER — Telehealth: Payer: Self-pay

## 2017-05-31 DIAGNOSIS — M531 Cervicobrachial syndrome: Secondary | ICD-10-CM | POA: Diagnosis not present

## 2017-05-31 DIAGNOSIS — F4322 Adjustment disorder with anxiety: Secondary | ICD-10-CM | POA: Diagnosis not present

## 2017-05-31 DIAGNOSIS — M9903 Segmental and somatic dysfunction of lumbar region: Secondary | ICD-10-CM | POA: Diagnosis not present

## 2017-05-31 DIAGNOSIS — M9901 Segmental and somatic dysfunction of cervical region: Secondary | ICD-10-CM | POA: Diagnosis not present

## 2017-05-31 NOTE — Telephone Encounter (Signed)
Patients procedure has been rescheduled to January 10th due to insurance changing.  Thanks [employer]

## 2017-06-02 ENCOUNTER — Encounter: Payer: Self-pay | Admitting: Gastroenterology

## 2017-06-04 ENCOUNTER — Telehealth: Payer: Self-pay | Admitting: Gastroenterology

## 2017-06-04 NOTE — Telephone Encounter (Signed)
Patient left a voice message that he needs a rx for Dexilant. He was given samples

## 2017-06-06 ENCOUNTER — Other Ambulatory Visit: Payer: Self-pay

## 2017-06-06 ENCOUNTER — Encounter: Payer: Self-pay | Admitting: *Deleted

## 2017-06-06 ENCOUNTER — Telehealth: Payer: Self-pay | Admitting: Gastroenterology

## 2017-06-06 ENCOUNTER — Other Ambulatory Visit: Payer: Self-pay | Admitting: Gastroenterology

## 2017-06-06 MED ORDER — DEXLANSOPRAZOLE 60 MG PO CPDR: 60.0000 mg | DELAYED_RELEASE_CAPSULE | Freq: Every day | ORAL | 6 refills | Status: DC

## 2017-06-06 NOTE — Telephone Encounter (Signed)
*  STAT* If patient is at the pharmacy, call can be transferred to refill team.   1. Which medications need to be refilled? (please list name of each medication and dose if known) Dexilant 60 mg   2. Which pharmacy/location (including street and city if local pharmacy) is medication to be sent to?[facility] [place]  3. Do they need a 30 day or 90 day supply? 30 day

## 2017-06-06 NOTE — Telephone Encounter (Signed)
Dexilant 60mg  has been sent to pt's pharmacy per his request.

## 2017-06-06 NOTE — Telephone Encounter (Signed)
Pt rescheduled egd to 06/14/17.

## 2017-06-06 NOTE — Telephone Encounter (Signed)
Patient would like to r/s his colonoscopy to a sooner appt.

## 2017-06-08 ENCOUNTER — Ambulatory Visit (INDEPENDENT_AMBULATORY_CARE_PROVIDER_SITE_OTHER): Payer: 59 | Admitting: Psychology

## 2017-06-08 DIAGNOSIS — F4322 Adjustment disorder with anxiety: Secondary | ICD-10-CM

## 2017-06-13 ENCOUNTER — Other Ambulatory Visit: Payer: Self-pay

## 2017-06-13 DIAGNOSIS — K2100 Gastro-esophageal reflux disease with esophagitis, without bleeding: Secondary | ICD-10-CM

## 2017-06-13 NOTE — Discharge Instructions (Signed)
General Anesthesia, Adult, Care After °These instructions provide you with information about caring for yourself after your procedure. Your health care provider may also give you more specific instructions. Your treatment has been planned according to current medical practices, but problems sometimes occur. Call your health care provider if you have any problems or questions after your procedure. °What can I expect after the procedure? °After the procedure, it is common to have: °· Vomiting. °· A sore throat. °· Mental slowness. ° °It is common to feel: °· Nauseous. °· Cold or shivery. °· Sleepy. °· Tired. °· Sore or achy, even in parts of your body where you did not have surgery. ° °Follow these instructions at home: °For at least 24 hours after the procedure: °· Do not: °? Participate in activities where you could fall or become injured. °? Drive. °? Use heavy machinery. °? Drink alcohol. °? Take sleeping pills or medicines that cause drowsiness. °? Make important decisions or sign legal documents. °? Take care of children on your own. °· Rest. °Eating and drinking °· If you vomit, drink water, juice, or soup when you can drink without vomiting. °· Drink enough fluid to keep your urine clear or pale yellow. °· Make sure you have little or no nausea before eating solid foods. °· Follow the diet recommended by your health care provider. °General instructions °· Have a responsible adult stay with you until you are awake and alert. °· Return to your normal activities as told by your health care provider. Ask your health care provider what activities are safe for you. °· Take over-the-counter and prescription medicines only as told by your health care provider. °· If you smoke, do not smoke without supervision. °· Keep all follow-up visits as told by your health care provider. This is important. °Contact a health care provider if: °· You continue to have nausea or vomiting at home, and medicines are not helpful. °· You  cannot drink fluids or start eating again. °· You cannot urinate after 8-12 hours. °· You develop a skin rash. °· You have fever. °· You have increasing redness at the site of your procedure. °Get help right away if: °· You have difficulty breathing. °· You have chest pain. °· You have unexpected bleeding. °· You feel that you are having a life-threatening or urgent problem. °This information is not intended to replace advice given to you by your health care provider. Make sure you discuss any questions you have with your health care provider. °Document Released: 09/04/2000 Document Revised: 11/01/2015 Document Reviewed: 05/13/2015 °Elsevier Interactive Patient Education © 2018 [employer] ° °

## 2017-06-14 ENCOUNTER — Ambulatory Visit
Admission: RE | Admit: 2017-06-14 | Discharge: 2017-06-14 | Disposition: A | Discharge status: Home / Self Care | Payer: 59 | Source: Ambulatory Visit | Attending: Gastroenterology | Admitting: Gastroenterology

## 2017-06-14 ENCOUNTER — Encounter
Admission: RE | Disposition: A | Discharge status: Home / Self Care | Payer: Self-pay | Source: Ambulatory Visit | Attending: Gastroenterology

## 2017-06-14 ENCOUNTER — Ambulatory Visit: Payer: 59 | Admitting: Anesthesiology

## 2017-06-14 DIAGNOSIS — K219 Gastro-esophageal reflux disease without esophagitis: Secondary | ICD-10-CM | POA: Insufficient documentation

## 2017-06-14 DIAGNOSIS — Z79899 Other long term (current) drug therapy: Secondary | ICD-10-CM | POA: Insufficient documentation

## 2017-06-14 DIAGNOSIS — K228 Other specified diseases of esophagus: Secondary | ICD-10-CM

## 2017-06-14 DIAGNOSIS — K2289 Other specified disease of esophagus: Secondary | ICD-10-CM

## 2017-06-14 DIAGNOSIS — Z87891 Personal history of nicotine dependence: Secondary | ICD-10-CM | POA: Diagnosis not present

## 2017-06-14 DIAGNOSIS — R12 Heartburn: Secondary | ICD-10-CM

## 2017-06-14 HISTORY — DX: Presence of spectacles and contact lenses: Z97.3

## 2017-06-14 HISTORY — PX: ESOPHAGOGASTRODUODENOSCOPY (EGD) WITH PROPOFOL: SHX5813

## 2017-06-14 SURGERY — ESOPHAGOGASTRODUODENOSCOPY (EGD) WITH PROPOFOL
Anesthesia: General | Site: Throat | Wound class: Clean Contaminated

## 2017-06-14 MED ORDER — LIDOCAINE HCL (CARDIAC) 20 MG/ML IV SOLN
INTRAVENOUS | Status: DC | PRN
  Administered 2017-06-14: 30 mg via INTRAVENOUS

## 2017-06-14 MED ORDER — OXYCODONE HCL 5 MG PO TABS: 5.0000 mg | ORAL_TABLET | Freq: Once | ORAL | Status: DC | PRN

## 2017-06-14 MED ORDER — OXYCODONE HCL 5 MG/5ML PO SOLN: 5.0000 mg | Freq: Once | ORAL | Status: DC | PRN

## 2017-06-14 MED ORDER — LACTATED RINGERS IV SOLN
INTRAVENOUS | Status: DC
  Administered 2017-06-14: 10:00:00 via INTRAVENOUS

## 2017-06-14 MED ORDER — GLYCOPYRROLATE 0.2 MG/ML IJ SOLN
INTRAMUSCULAR | Status: DC | PRN
  Administered 2017-06-14: 0.1 mg via INTRAVENOUS

## 2017-06-14 MED ORDER — PROPOFOL 10 MG/ML IV BOLUS
INTRAVENOUS | Status: DC | PRN
  Administered 2017-06-14: 30 mg via INTRAVENOUS
  Administered 2017-06-14: 150 mg via INTRAVENOUS
  Administered 2017-06-14: 50 mg via INTRAVENOUS

## 2017-06-14 MED ORDER — STERILE WATER FOR IRRIGATION IR SOLN
Status: DC | PRN
  Administered 2017-06-14: 11:00:00

## 2017-06-14 SURGICAL SUPPLY — 32 items
BALLN DILATOR 10-12 8 (BALLOONS)
BALLN DILATOR 12-15 8 (BALLOONS)
BALLN DILATOR 15-18 8 (BALLOONS)
BALLN DILATOR CRE 0-12 8 (BALLOONS)
BALLN DILATOR ESOPH 8 10 CRE (MISCELLANEOUS) IMPLANT
BALLOON DILATOR 12-15 8 (BALLOONS) IMPLANT
BALLOON DILATOR 15-18 8 (BALLOONS) IMPLANT
BALLOON DILATOR CRE 0-12 8 (BALLOONS) IMPLANT
BLOCK BITE 60FR ADLT L/F GRN (MISCELLANEOUS) ×3 IMPLANT
CANISTER SUCT 1200ML W/VALVE (MISCELLANEOUS) ×3 IMPLANT
CLIP HMST 235XBRD CATH ROT (MISCELLANEOUS) IMPLANT
CLIP RESOLUTION 360 11X235 (MISCELLANEOUS)
FCP ESCP3.2XJMB 240X2.8X (MISCELLANEOUS)
FORCEPS BIOP RAD 4 LRG CAP 4 (CUTTING FORCEPS) IMPLANT
FORCEPS BIOP RJ4 240 W/NDL (MISCELLANEOUS)
FORCEPS ESCP3.2XJMB 240X2.8X (MISCELLANEOUS) IMPLANT
GOWN CVR UNV OPN BCK APRN NK (MISCELLANEOUS) ×2 IMPLANT
GOWN ISOL THUMB LOOP REG UNIV (MISCELLANEOUS) ×4
INJECTOR VARIJECT VIN23 (MISCELLANEOUS) IMPLANT
KIT DEFENDO VALVE AND CONN (KITS) IMPLANT
KIT ENDO PROCEDURE OLY (KITS) ×3 IMPLANT
MARKER SPOT ENDO TATTOO 5ML (MISCELLANEOUS) IMPLANT
PAD GROUND ADULT SPLIT (MISCELLANEOUS) IMPLANT
RETRIEVER NET PLAT FOOD (MISCELLANEOUS) IMPLANT
SNARE SHORT THROW 13M SML OVAL (MISCELLANEOUS) IMPLANT
SNARE SHORT THROW 30M LRG OVAL (MISCELLANEOUS) IMPLANT
SPOT EX ENDOSCOPIC TATTOO (MISCELLANEOUS)
SYR INFLATION 60ML (SYRINGE) IMPLANT
TRAP ETRAP POLY (MISCELLANEOUS) IMPLANT
VARIJECT INJECTOR VIN23 (MISCELLANEOUS)
WATER STERILE IRR 250ML POUR (IV SOLUTION) ×3 IMPLANT
WIRE CRE 18-20MM 8CM F G (MISCELLANEOUS) IMPLANT

## 2017-06-14 NOTE — Transfer of Care (Signed)
Immediate Anesthesia Transfer of Care Note  Patient: Robert [name]  Procedure(s) Performed: ESOPHAGOGASTRODUODENOSCOPY (EGD) WITH PROPOFOL (N/A Throat)  Patient Location: PACU  Anesthesia Type: General  Level of Consciousness: awake, alert  and patient cooperative  Airway and Oxygen Therapy: Patient Spontanous Breathing and Patient connected to supplemental oxygen  Post-op Assessment: Post-op Vital signs reviewed, Patient's Cardiovascular Status Stable, Respiratory Function Stable, Patent Airway and No signs of Nausea or vomiting  Post-op Vital Signs: Reviewed and stable  Complications: No apparent anesthesia complications

## 2017-06-14 NOTE — H&P (Signed)
Midge Minium, MD [facility] [address] [place], [place] [address] Phone:[phone] Fax : [phone]  Primary Care Physician:  Smitty Cords, DO Primary Gastroenterologist:  Dr. Servando Snare  Pre-Procedure History & Physical: HPI:  Robert [name] is a 37 y.o. male is here for an endoscopy.   Past Medical History:  Diagnosis Date  . ADHD   . GERD (gastroesophageal reflux disease)   . Wears contact lenses     Past Surgical History:  Procedure Laterality Date  . SEPTOPLASTY  06/13/2011  . WISDOM TOOTH EXTRACTION      Prior to Admission medications   Medication Sig Start Date End Date Taking? Authorizing Provider  dexlansoprazole (DEXILANT) 60 MG capsule Take 1 capsule (60 mg total) by mouth daily. 06/06/17  Yes Midge Minium, MD  ipratropium (ATROVENT) 0.06 % nasal spray Place 2 sprays into both nostrils 4 (four) times daily. For up to 5-7 days then stop. 05/23/17  Yes Karamalegos, Netta Neat, DO  amoxicillin-clavulanate (AUGMENTIN) 875-125 MG tablet Take 1 tablet by mouth 2 (two) times daily. Patient not taking: Reported on 06/14/2017 05/23/17   Smitty Cords, DO  fluticasone [facility]) 50 MCG/ACT nasal spray Place 2 sprays into both nostrils daily. Use for 4-6 weeks then stop and use seasonally or as needed. Patient not taking: Reported on 05/28/2017 04/12/17   Smitty Cords, DO  omeprazole (PRILOSEC) 20 MG capsule Take 1 capsule (20 mg total) by mouth daily before breakfast. Patient not taking: Reported on 05/28/2017 04/12/17   Smitty Cords, DO    Allergies as of 05/22/2017  . (No Known Allergies)    Family History  Problem Relation Age of Onset  . Cancer Maternal Grandfather 84       stomach  . Esophageal cancer Maternal Grandfather 16  . Colon cancer Neg Hx   . Prostate cancer Neg Hx   . Diabetes Neg Hx   . Hypertension Neg Hx   . Hypercholesterolemia Neg Hx     Social History   Socioeconomic History  . Marital  status: Married    Spouse name: Not on file  . Number of children: 1  . Years of education: Vocational  . Highest education level: Not on file  Social Needs  . Financial resource strain: Not on file  . Food insecurity - worry: Not on file  . Food insecurity - inability: Not on file  . Transportation needs - medical: Not on file  . Transportation needs - non-medical: Not on file  Occupational History  . Occupation: IT    Comment: Commuting to [employer]  . Smoking status: Former Smoker    Packs/day: 1.00    Years: 15.00    Pack years: 15.00    Types: Cigarettes    Last attempt to quit: 10/10/2016    Years since quitting: 0.6  . Smokeless tobacco: Never Used  . Tobacco comment: Quit using vaping  Substance and Sexual Activity  . Alcohol use: No  . Drug use: No  . Sexual activity: Not on file  Other Topics Concern  . Not on file  Social History Narrative  . Not on file    Review of Systems: See HPI, otherwise negative ROS  Physical Exam: BP 117/75   Pulse 98   Temp 98.1 F (36.7 C) (Tympanic)   Resp 17   Ht 5\' 6"  (1.676 m)   Wt 193 lb (87.5 kg)   SpO2 100%   BMI 31.15 kg/m  General:  Alert,  pleasant and cooperative in NAD Head:  Normocephalic and atraumatic. Neck:  Supple; no masses or thyromegaly. Lungs:  Clear throughout to auscultation.    Heart:  Regular rate and rhythm. Abdomen:  Soft, nontender and nondistended. Normal bowel sounds, without guarding, and without rebound.   Neurologic:  Alert and  oriented x4;  grossly normal neurologically.  Impression/Plan: Robert [name] is here for an endoscopy to be performed for GERD  Risks, benefits, limitations, and alternatives regarding  endoscopy have been reviewed with the patient.  Questions have been answered.  All parties agreeable.   Midge Minium, MD  06/14/2017, 10:15 AM

## 2017-06-14 NOTE — Anesthesia Preprocedure Evaluation (Signed)
Anesthesia Evaluation  Patient identified by MRN, date of birth, ID band  Reviewed: NPO status   History of Anesthesia Complications Negative for: history of anesthetic complications  Airway Mallampati: II  TM Distance: >3 FB Neck ROM: full    Dental no notable dental hx.    Pulmonary neg pulmonary ROS, former smoker,    Pulmonary exam normal        Cardiovascular Exercise Tolerance: Good negative cardio ROS Normal cardiovascular exam     Neuro/Psych adhd negative psych ROS   GI/Hepatic Neg liver ROS, GERD  Controlled,  Endo/Other  negative endocrine ROS  Renal/GU negative Renal ROS  negative genitourinary   Musculoskeletal   Abdominal   Peds  Hematology negative hematology ROS (+)   Anesthesia Other Findings tiva  Reproductive/Obstetrics                             Anesthesia Physical Anesthesia Plan  ASA: II  Anesthesia Plan: General   Post-op Pain Management:    Induction:   PONV Risk Score and Plan:   Airway Management Planned:   Additional Equipment:   Intra-op Plan:   Post-operative Plan:   Informed Consent: I have reviewed the patients History and Physical, chart, labs and discussed the procedure including the risks, benefits and alternatives for the proposed anesthesia with the patient or authorized representative who has indicated his/her understanding and acceptance.     Plan Discussed with: CRNA  Anesthesia Plan Comments:         Anesthesia Quick Evaluation

## 2017-06-14 NOTE — Anesthesia Postprocedure Evaluation (Signed)
Anesthesia Post Note  Patient: Robert [name]  Procedure(s) Performed: ESOPHAGOGASTRODUODENOSCOPY (EGD) WITH PROPOFOL (N/A Throat)  Patient location during evaluation: PACU Anesthesia Type: General Level of consciousness: awake and alert Pain management: pain level controlled Vital Signs Assessment: post-procedure vital signs reviewed and stable Respiratory status: spontaneous breathing, nonlabored ventilation, respiratory function stable and patient connected to nasal cannula oxygen Cardiovascular status: blood pressure returned to baseline and stable Postop Assessment: no apparent nausea or vomiting Anesthetic complications: no    Amandine Covino

## 2017-06-14 NOTE — Anesthesia Procedure Notes (Signed)
Date/Time: 06/14/2017 11:17 AM Performed by: Maree Krabbe, CRNA Pre-anesthesia Checklist: Patient identified, Emergency Drugs available, Suction available, Timeout performed and Patient being monitored Patient Re-evaluated:Patient Re-evaluated prior to induction Oxygen Delivery Method: Nasal cannula Placement Confirmation: positive ETCO2

## 2017-06-14 NOTE — Op Note (Addendum)
[facility] Patient Name: Robert [name] Procedure Date: 06/14/2017 11:12 AM MRN: [id number] Account #: [id number] Date of Birth: 1981-01-10 Admit Type: Outpatient Age: 37 Room: [facility] OR ROOM 01 Gender: Male Note Status: Supervisor Override Procedure:            Upper GI endoscopy Indications:          Heartburn Providers:            Midge Minium MD, MD Referring MD:         Smitty Cords (Referring MD) Medicines:            Propofol per Anesthesia Complications:        No immediate complications. Procedure:            Pre-Anesthesia Assessment:                       - Prior to the procedure, a History and Physical was                        performed, and patient medications and allergies were                        reviewed. The patient's tolerance of previous                        anesthesia was also reviewed. The risks and benefits of                        the procedure and the sedation options and risks were                        discussed with the patient. All questions were                        answered, and informed consent was obtained. Prior                        Anticoagulants: The patient has taken no previous                        anticoagulant or antiplatelet agents. ASA Grade                        Assessment: II - A patient with mild systemic disease.                        After reviewing the risks and benefits, the patient was                        deemed in satisfactory condition to undergo the                        procedure.                       After obtaining informed consent, the endoscope was                        passed under direct vision. Throughout the procedure,  the patient's blood pressure, pulse, and oxygen                        saturations were monitored continuously. The Olympus                        [id number]-HQ190 Endoscope (S#. [id number]) was introduced    through the mouth, and advanced to the second part of                        duodenum. The upper GI endoscopy was accomplished                        without difficulty. The patient tolerated the procedure                        well. Findings:      The Z-line was irregular and was found at the gastroesophageal junction.      The stomach was normal.      The examined duodenum was normal. Impression:           - Z-line irregular, at the gastroesophageal junction.                       - Normal stomach.                       - Normal examined duodenum.                       - No specimens collected. Recommendation:       - Discharge patient to home.                       - Resume previous diet.                       - Continue present medications.                       - Continue present medications. Procedure Code(s):    --- Professional ---                       [phone], Esophagogastroduodenoscopy, flexible, transoral;                        diagnostic, including collection of specimen(s) by                        brushing or washing, when performed (separate procedure) Diagnosis Code(s):    --- Professional ---                       R12, Heartburn                       K22.8, Other specified diseases of esophagus CPT copyright 2018 American Medical Association. All rights reserved. The codes documented in this report are preliminary and upon coder review may  be revised to meet current compliance requirements. Midge Minium MD, MD 06/14/2017 11:26:50 AM This report has been signed electronically. Number of Addenda: 0 Note Initiated On: 06/14/2017 11:12 AM      [facility]

## 2017-06-15 ENCOUNTER — Ambulatory Visit (INDEPENDENT_AMBULATORY_CARE_PROVIDER_SITE_OTHER): Payer: 59 | Admitting: Family Medicine

## 2017-06-15 ENCOUNTER — Ambulatory Visit (INDEPENDENT_AMBULATORY_CARE_PROVIDER_SITE_OTHER): Payer: 59 | Admitting: Psychology

## 2017-06-15 ENCOUNTER — Encounter: Payer: Self-pay | Admitting: Family Medicine

## 2017-06-15 VITALS — BP 122/71 | HR 91 | Temp 97.6°F | Resp 16 | Ht 66.0 in | Wt 204.0 lb

## 2017-06-15 DIAGNOSIS — K219 Gastro-esophageal reflux disease without esophagitis: Secondary | ICD-10-CM | POA: Diagnosis not present

## 2017-06-15 DIAGNOSIS — F4322 Adjustment disorder with anxiety: Secondary | ICD-10-CM

## 2017-06-15 DIAGNOSIS — R490 Dysphonia: Secondary | ICD-10-CM | POA: Diagnosis not present

## 2017-06-15 NOTE — Patient Instructions (Addendum)
Thank you for coming to the office today.  1.  Continue Dexilant for now  Please call Dr Servando Snare regarding follow-up from your EGD - ask about irregular Z line and duration of Dexilant  2.  ENT  [facility] ENT [facility] [address]  [place], [place] [address] Ph: [phone]  [facility] ENT [facility] [address]  [place], [place] [address] Ph: [phone]   Please schedule a Follow-up Appointment to: Return if symptoms worsen or fail to improve, for ENT follow-up.    If you have any other questions or concerns, please feel free to call the office or send a message through MyChart. You may also schedule an earlier appointment if necessary.  Additionally, you may be receiving a survey about your experience at our office within a few days to 1 week by e-mail or mail. We value your feedback.  Saralyn Pilar, DO [facility] [place], [place]

## 2017-06-15 NOTE — Progress Notes (Signed)
Subjective:    Patient ID: Robert [name], male    DOB: 1981-03-20, 37 y.o.   MRN: [id number]  Robert [name] is a 37 y.o. male presenting on 06/15/2017 for Gastroesophageal Reflux (may need referral to ENT)   HPI   FOLLOW-UP GERD vs LPR / Hoarse Voice /  - Last visit with me 05/23/17, for subsequent visit for same problem, treated with Augmentin and Atrovent nasal for sinusitis, also continued Dexilant per GI, see prior notes for background information. - Interval update with resolved sinusitis - Today patient reports persistent to worsening at times GERD LPR symptoms. He is followed by Dr Servando Snare [facility], he had office visit on 12/11 and EGD on 06/14/17 yesterday, he was told some results but waiting to hear follow-up plan, reviewed result in Epic myself and showed Irregular Z-line, otherwise no abnormal tissue. No biopsy taken - He is still taking Dexilant 60mg  daily PPI with good results on GERD, but seems to have worse bloating, and gassy feeling, has a fullness sensation in throat and did have one distinct episode of rough very sore throat pain and felt like "bruised vocal cords", painful to speak, lost voice, now starting to get back, also sneezing put pressure on throat - He feels at times some difficulty swallowing due to throat sensation - Requesting 2nd opinion now after GI seems unremarkable on scope - Still trying to improve diet, and thinks anxiety and sinus playing some role - Denies abdominal pain, nausea vomiting, unintentional weight loss, early satiety, fever chills  FOLLOW-up Sinusitis / Seasonal Allergies / Rhinitis - Improved to resolved after antibiotic   Depression screen [facility] 2/9 06/15/2017 04/12/2017  Decreased Interest 0 0  Down, Depressed, Hopeless 0 0  PHQ - 2 Score 0 0    Social History   Tobacco Use  . Smoking status: Former Smoker    Packs/day: 1.00    Years: 15.00    Pack years: 15.00    Types: Cigarettes    Last attempt to quit: 10/10/2016    Years since  quitting: 0.6  . Smokeless tobacco: Never Used  . Tobacco comment: Quit using vaping  Substance Use Topics  . Alcohol use: No  . Drug use: No    Review of Systems Per HPI unless specifically indicated above     Objective:    BP 122/71   Pulse 91   Temp 97.6 F (36.4 C) (Oral)   Resp 16   Ht 5\' 6"  (1.676 m)   Wt 204 lb (92.5 kg)   BMI 32.93 kg/m   Wt Readings from Last 3 Encounters:  06/15/17 204 lb (92.5 kg)  06/14/17 193 lb (87.5 kg)  05/23/17 198 lb (89.8 kg)    Physical Exam  Constitutional: He is oriented to person, place, and time. He appears well-developed and well-nourished. No distress.  Well-appearing, comfortable, cooperative  HENT:  Head: Normocephalic and atraumatic.  Mouth/Throat: Oropharynx is clear and moist.  Eyes: Conjunctivae are normal. Right eye exhibits no discharge. Left eye exhibits no discharge.  Neck: Normal range of motion. Neck supple. No thyromegaly present.  Cardiovascular: Normal rate, regular rhythm, normal heart sounds and intact distal pulses.  No murmur heard. Pulmonary/Chest: Effort normal and breath sounds normal. No respiratory distress. He has no wheezes. He has no rales.  Musculoskeletal: Normal range of motion. He exhibits no edema.  Lymphadenopathy:    He has no cervical adenopathy.  Neurological: He is alert and oriented to person, place, and time.  Skin: Skin is  warm and dry. No rash noted. He is not diaphoretic. No erythema.  Psychiatric: He has a normal mood and affect. His behavior is normal.  Well groomed, good eye contact, normal speech and thoughts  Nursing note and vitals reviewed.  No results found for this or any previous visit.    Assessment & Plan:   Problem List Items Addressed This Visit    GERD (gastroesophageal reflux disease)   Relevant Orders   Ambulatory referral to ENT   Laryngopharyngeal reflux (LPR) - Primary    Improved on high dose Dexilant PPI however has other symptoms refractory worsening  episode laryngitis hoarse voice and pain is concerning, with mostly unremarkable EGD - See prior note, consistent with LPR. Smoking history risk - Prior history without significant GI red flags, however seems to endorse some difficulty with swallowing at times as well, has constellation of symptoms - Followed by [facility] Dr Servando Snare -s/p EGD 06/14/17, no further plan other than PPI - Likely worsened with anxiety, sinusitis/rhinitis  Plan: 1. Discussion on his constellation of symptoms - concern with recent episode, he has followed ENT in past at [facility] for sinus problems, now he wishes to return to ENT for 2nd opinion, would like to re-establish locally - Urgent referral sent to [facility] due to dramatic worsening symptoms despite PPI and negative EGD - Contact Dr Annabell Sabal office to follow-up future PPI plan - Continue higher dose PPI Dexilant through GI for now - may taper down in future if need once controlled - Follow-up as needed      Relevant Orders   Ambulatory referral to ENT    Other Visit Diagnoses    Hoarseness of voice       Relevant Orders   Ambulatory referral to ENT      Orders Placed This Encounter  Procedures  . Ambulatory referral to ENT    Referral Priority:   Urgent    Referral Type:   Consultation    Referral Reason:   Specialty Services Required    Requested Specialty:   Otolaryngology    Number of Visits Requested:   1     No orders of the defined types were placed in this encounter.  Follow up plan: Return if symptoms worsen or fail to improve, for ENT follow-up.  Saralyn Pilar, DO [facility] 06/17/2017, 10:58 AM

## 2017-06-17 NOTE — Assessment & Plan Note (Signed)
Improved on high dose Dexilant PPI however has other symptoms refractory worsening episode laryngitis hoarse voice and pain is concerning, with mostly unremarkable EGD - See prior note, consistent with LPR. Smoking history risk - Prior history without significant GI red flags, however seems to endorse some difficulty with swallowing at times as well, has constellation of symptoms - Followed by [facility] Dr Servando Snare -s/p EGD 06/14/17, no further plan other than PPI - Likely worsened with anxiety, sinusitis/rhinitis  Plan: 1. Discussion on his constellation of symptoms - concern with recent episode, he has followed ENT in past at [facility] for sinus problems, now he wishes to return to ENT for 2nd opinion, would like to re-establish locally - Urgent referral sent to [facility] due to dramatic worsening symptoms despite PPI and negative EGD - Contact Dr Annabell Sabal office to follow-up future PPI plan - Continue higher dose PPI Dexilant through GI for now - may taper down in future if need once controlled - Follow-up as needed

## 2017-06-21 ENCOUNTER — Ambulatory Visit (INDEPENDENT_AMBULATORY_CARE_PROVIDER_SITE_OTHER): Payer: 59 | Admitting: Psychology

## 2017-06-21 DIAGNOSIS — F4322 Adjustment disorder with anxiety: Secondary | ICD-10-CM

## 2017-06-22 DIAGNOSIS — M9903 Segmental and somatic dysfunction of lumbar region: Secondary | ICD-10-CM | POA: Diagnosis not present

## 2017-06-22 DIAGNOSIS — M531 Cervicobrachial syndrome: Secondary | ICD-10-CM | POA: Diagnosis not present

## 2017-06-22 DIAGNOSIS — M9901 Segmental and somatic dysfunction of cervical region: Secondary | ICD-10-CM | POA: Diagnosis not present

## 2017-06-26 ENCOUNTER — Encounter: Payer: Self-pay | Admitting: Gastroenterology

## 2017-06-28 ENCOUNTER — Ambulatory Visit (INDEPENDENT_AMBULATORY_CARE_PROVIDER_SITE_OTHER): Payer: 59 | Admitting: Psychology

## 2017-06-28 DIAGNOSIS — M531 Cervicobrachial syndrome: Secondary | ICD-10-CM | POA: Diagnosis not present

## 2017-06-28 DIAGNOSIS — M9901 Segmental and somatic dysfunction of cervical region: Secondary | ICD-10-CM | POA: Diagnosis not present

## 2017-06-28 DIAGNOSIS — M9903 Segmental and somatic dysfunction of lumbar region: Secondary | ICD-10-CM | POA: Diagnosis not present

## 2017-06-28 DIAGNOSIS — F4322 Adjustment disorder with anxiety: Secondary | ICD-10-CM | POA: Diagnosis not present

## 2017-07-01 DIAGNOSIS — Z719 Counseling, unspecified: Secondary | ICD-10-CM | POA: Diagnosis not present

## 2017-07-05 ENCOUNTER — Ambulatory Visit
Admission: RE | Admit: 2017-07-05 | Discharge: 2017-07-05 | Disposition: A | Discharge status: Home / Self Care | Payer: 59 | Source: Ambulatory Visit | Attending: Unknown Physician Specialty | Admitting: Unknown Physician Specialty

## 2017-07-05 ENCOUNTER — Telehealth: Payer: Self-pay | Admitting: Gastroenterology

## 2017-07-05 ENCOUNTER — Other Ambulatory Visit: Payer: Self-pay | Admitting: Unknown Physician Specialty

## 2017-07-05 ENCOUNTER — Ambulatory Visit (INDEPENDENT_AMBULATORY_CARE_PROVIDER_SITE_OTHER): Payer: 59 | Admitting: Psychology

## 2017-07-05 DIAGNOSIS — J329 Chronic sinusitis, unspecified: Secondary | ICD-10-CM

## 2017-07-05 DIAGNOSIS — F4322 Adjustment disorder with anxiety: Secondary | ICD-10-CM | POA: Diagnosis not present

## 2017-07-05 DIAGNOSIS — R0981 Nasal congestion: Secondary | ICD-10-CM | POA: Diagnosis not present

## 2017-07-05 DIAGNOSIS — J309 Allergic rhinitis, unspecified: Secondary | ICD-10-CM | POA: Diagnosis not present

## 2017-07-05 DIAGNOSIS — R1310 Dysphagia, unspecified: Secondary | ICD-10-CM | POA: Diagnosis not present

## 2017-07-05 NOTE — Telephone Encounter (Signed)
*  STAT* If patient is at the pharmacy, call can be transferred to refill team.   1. Which medications need to be refilled? (please list name of each medication and dose if known) Dexilant   2. Which pharmacy/location (including street and city if local pharmacy) is medication to be sent to? [facility]  3. Do they need a 30 day or 90 day supply? 30 day

## 2017-07-06 DIAGNOSIS — M531 Cervicobrachial syndrome: Secondary | ICD-10-CM | POA: Diagnosis not present

## 2017-07-06 DIAGNOSIS — M9901 Segmental and somatic dysfunction of cervical region: Secondary | ICD-10-CM | POA: Diagnosis not present

## 2017-07-06 DIAGNOSIS — M9903 Segmental and somatic dysfunction of lumbar region: Secondary | ICD-10-CM | POA: Diagnosis not present

## 2017-07-09 ENCOUNTER — Telehealth: Payer: Self-pay | Admitting: Gastroenterology

## 2017-07-09 NOTE — Telephone Encounter (Signed)
PATIENT CALLED & L/M ON ANSWERING MACHINE STATING HE NEEDS A REFILL ON DEXILANT 60MG S. HE WILL TRY TO CALL BACK.

## 2017-07-09 NOTE — Telephone Encounter (Signed)
Pt advised he was given 5 refills in December. Pt advised he just realized when he looked at his bottle.

## 2017-07-10 ENCOUNTER — Other Ambulatory Visit: Payer: Self-pay | Admitting: Unknown Physician Specialty

## 2017-07-10 DIAGNOSIS — R1312 Dysphagia, oropharyngeal phase: Secondary | ICD-10-CM

## 2017-07-12 ENCOUNTER — Ambulatory Visit (INDEPENDENT_AMBULATORY_CARE_PROVIDER_SITE_OTHER): Payer: 59 | Admitting: Psychology

## 2017-07-12 DIAGNOSIS — F4322 Adjustment disorder with anxiety: Secondary | ICD-10-CM

## 2017-07-13 ENCOUNTER — Ambulatory Visit: Payer: 59 | Admitting: Family Medicine

## 2017-07-13 DIAGNOSIS — Z719 Counseling, unspecified: Secondary | ICD-10-CM | POA: Diagnosis not present

## 2017-07-17 DIAGNOSIS — J301 Allergic rhinitis due to pollen: Secondary | ICD-10-CM | POA: Diagnosis not present

## 2017-07-19 ENCOUNTER — Ambulatory Visit: Payer: 59 | Admitting: Psychology

## 2017-07-20 DIAGNOSIS — Z719 Counseling, unspecified: Secondary | ICD-10-CM | POA: Diagnosis not present

## 2017-07-26 ENCOUNTER — Ambulatory Visit (INDEPENDENT_AMBULATORY_CARE_PROVIDER_SITE_OTHER): Payer: 59 | Admitting: Psychology

## 2017-07-26 DIAGNOSIS — F4322 Adjustment disorder with anxiety: Secondary | ICD-10-CM | POA: Diagnosis not present

## 2017-08-02 ENCOUNTER — Ambulatory Visit
Admission: RE | Admit: 2017-08-02 | Discharge: 2017-08-02 | Disposition: A | Discharge status: Home / Self Care | Payer: 59 | Source: Ambulatory Visit | Attending: Unknown Physician Specialty | Admitting: Unknown Physician Specialty

## 2017-08-02 DIAGNOSIS — J309 Allergic rhinitis, unspecified: Secondary | ICD-10-CM | POA: Diagnosis not present

## 2017-08-02 DIAGNOSIS — R1310 Dysphagia, unspecified: Secondary | ICD-10-CM | POA: Diagnosis not present

## 2017-08-02 DIAGNOSIS — R1312 Dysphagia, oropharyngeal phase: Secondary | ICD-10-CM | POA: Insufficient documentation

## 2017-08-02 DIAGNOSIS — R1314 Dysphagia, pharyngoesophageal phase: Secondary | ICD-10-CM

## 2017-08-02 NOTE — Therapy (Addendum)
[facility] DIAGNOSTIC RADIOLOGY [address] [place], [place], [address] Phone: [phone]   Fax:     Modified Barium Swallow  Patient Details  Name: Robert [name] MRN: [id number] Date of Birth: 07-Nov-1980 No Data Recorded  Encounter Date: 08/02/2017  End of Session - 08/02/17 1440    Visit Number  1    Number of Visits  1    Date for SLP Re-Evaluation  08/02/17    SLP Start Time  1250    SLP Stop Time   1350    SLP Time Calculation (min)  60 min    Activity Tolerance  Patient tolerated treatment well       Past Medical History:  Diagnosis Date  . ADHD   . GERD (gastroesophageal reflux disease)   . Wears contact lenses     Past Surgical History:  Procedure Laterality Date  . ESOPHAGOGASTRODUODENOSCOPY (EGD) WITH PROPOFOL N/A 06/14/2017   Procedure: ESOPHAGOGASTRODUODENOSCOPY (EGD) WITH PROPOFOL;  Surgeon: Midge Minium, MD;  Location: [facility];  Service: Endoscopy;  Laterality: N/A;  . SEPTOPLASTY  06/13/2011  . WISDOM TOOTH EXTRACTION      There were no vitals filed for this visit.       Subjective: Patient behavior: (alertness, ability to follow instructions, etc.): pt alert, verbally conversive and followed through instructions. Pt had several questions/concerns re: his swallowing, then ENT/sinus issues, GERD, medications, and prognosis in these areas - suggested pt f/u w/ individual disciplines/MDs (GI and ENT) for ongoing consultation re: questions.  Pt denied any neurological history; noted identification by Neurology/MD of Tourette's Syndrome/behaviors in 2014/2015. Pt has a h/o Rhinoplasty/Septoplasty surgery in 2015 and continues to f/u w/ ENT. PMH per chart notes also indicated Anxiety, LPR w/ PPI recommendation, Sleep Apnea w/ recommendation for CPAP, allergies including dust, per pt. Pt denies any Pulmonary decline or infection; reported he was recently treated for the "flu". Pt has native dentition.  Chief  complaint: dysphagia. Of note, pt described "an explosion of acid reflux into my throat" in Dec. 2018. PPI initiation has been recently since then. Pt stated he felt inconsistent results w/ medications until now w/ his current PPI - wondered if he could stop taking it "in the future". SLP encouraged him to take it daily and NOT stop until talking w/ GI.    Objective:  Radiological Procedure: A videoflouroscopic evaluation of oral-preparatory, reflex initiation, and pharyngeal phases of the swallow was performed; as well as a screening of the upper esophageal phase.  I. POSTURE: upright II. VIEW: lateral III. COMPENSATORY STRATEGIES: pt uses multiple swallows ("piecemeals") IV. BOLUSES ADMINISTERED:  Thin Liquid: 5 trials  Nectar-thick Liquid: 2 trials  Honey-thick Liquid: NT  Puree: 3 trials  Mechanical Soft: 2 trials V. RESULTS OF EVALUATION: A. ORAL PREPARATORY PHASE: (The lips, tongue, and velum are observed for strength and coordination)       **Overall Severity Rating: WFL. Pt exhibited adequate bolus management and control for A-P transfer; no oral residue remained. Pt "piecemeals" all boluses (appears to be habitual).   B. SWALLOW INITIATION/REFLEX: (The reflex is normal if "triggered" by the time the bolus reached the base of the tongue)  **Overall Severity Rating: MODERATE. Pharyngeal swallow initiation was delayed w/ liquid trials; slightly w/ trials of increased texture. Pt was able to maintain airway protection and closure w/ NO laryngeal penetration or aspiration during the swallow.   C. PHARYNGEAL PHASE: (Pharyngeal function is normal if the bolus shows rapid, smooth, and continuous transit  through the pharynx and there is no pharyngeal residue after the swallow)  **Overall Severity Rating: MILD+. Min+ bolus material remained post initial swallow in both the valleculae and pyriform sinuses w/ all trials; this material reduced significantly and/or cleared w/ the f/u piecemeal  swallowing. Noted min decreased laryngeal excursion, epiglottic inversion. No soft palate deficits noted during the swallowing.  D. LARYNGEAL PENETRATION: (Material entering into the laryngeal inlet/vestibule but not aspirated): NONE  E. ASPIRATION: NONE F. ESOPHAGEAL PHASE: (Screening of the upper esophagus): Noted bolus retention in the lower Cervical Esophagus just below the level of the shoulders(view limited). Any bolus stasis could impact bolus motility throughout the Esophagus both superiorly and distally.    ASSESSMENT: Pt appears to present w/ adequate oropharyngeal phase swallowing function w/ reduced risk for aspiration from an oropharyngeal phase standpoint. Pt does have significant h/o, and reported s/s of, GERD w/ Regurgitation which could increase risk for aspiration of Reflux material. During this exam w/ trials given, the oral phase appeared grossly wfl. Pt exhibited adequate bolus management and control for A-P transfer; no oral residue remained. Pt "piecemeals" all boluses (appears to be habitual). During the pharyngeal phase, pt's pharyngeal swallow initiation was delayed w/ liquid trials; slightly delayed w/ trials of increased texture (foods). Pt was able to maintain airway closure and protection w/ NO laryngeal penetration or aspiration during the swallowing. Suspect pt's longstanding h/o acid reflux to the level of the pharynx per his report, could impact the sensation of tissue in the pharynx thus impact the timing (sensation) of swallowing. Also noted min+ bolus material remaining post initial swallow in both the valleculae and pyriform sinuses w/ all trials; this material reduced significantly and/or cleared w/ the f/u piecemeal swallowing. Noted min decreased laryngeal excursion, epiglottic inversion. No soft palate deficits noted during the swallowing. Again, longstanding h/o acid reflux, and any Esophageal dysmotility, could impact the physiology of the tissues/muscles of  swallowing during the pharyngeal phase. Of note, during the Esophageal phase of swallowing, bolus retention was noted in the lower Cervical Esophagus just below the level of the tip of the shoulders(view limited). Any bolus stasis could impact bolus motility throughout the Esophagus both superiorly and distally.    PLAN/RECOMMENDATIONS:  A. Diet: continue a Regular diet(meats cut small, foods moistened well); Thin liquids.  Avoid any foods that are problematic. F/u w/ Dietitian for support as needed.   B. Swallowing Precautions: general aspiration precautions; general Reflux precautions but instructed pt to defer to GI recommendations as well.  C. Recommended consultation to: f/u w/ GI and ENT for ongoing management of baseline issues.  D. Therapy recommendations: None.   E. Results and recommendations were discussed w/ patient; video viewed and questions answered re: swallowing.             Dysphagia, pharyngoesophageal phase  Oropharyngeal dysphagia - Plan: DG Swallowing Func-Speech Pathology, DG Swallowing Func-Speech Pathology  G-Codes - 08-29-2017 1441    Functional Assessment Tool Used  clinical judgement    Functional Limitations  Swallowing    Swallow Current Status (Z6109)  At least 1 percent but less than 20 percent impaired, limited or restricted    Swallow Goal Status (U0454)  At least 1 percent but less than 20 percent impaired, limited or restricted    Swallow Discharge Status [phone])  At least 1 percent but less than 20 percent impaired, limited or restricted           Problem List Patient Active Problem List   Diagnosis Date  Noted  . Heartburn   . Other specified diseases of esophagus   . History of vertigo 05/24/2017  . GERD (gastroesophageal reflux disease) 04/12/2017  . Laryngopharyngeal reflux (LPR) 04/12/2017  . S/P nasal septoplasty 04/12/2017  . Seasonal allergies 04/12/2017  . Allergic rhinitis 04/12/2017      Jerilynn Som, MS,  CCC-SLP [place] 08/02/2017, 2:42 PM  [facility] DIAGNOSTIC RADIOLOGY [address] [place], [place], [address] Phone: [phone]   Fax:     Name: Robert [name] MRN: [id number] Date of Birth: Jun 20, 1980

## 2017-08-03 ENCOUNTER — Ambulatory Visit (INDEPENDENT_AMBULATORY_CARE_PROVIDER_SITE_OTHER): Payer: 59 | Admitting: Psychology

## 2017-08-03 DIAGNOSIS — F4322 Adjustment disorder with anxiety: Secondary | ICD-10-CM | POA: Diagnosis not present

## 2017-08-03 DIAGNOSIS — Z719 Counseling, unspecified: Secondary | ICD-10-CM | POA: Diagnosis not present

## 2017-08-09 ENCOUNTER — Ambulatory Visit (INDEPENDENT_AMBULATORY_CARE_PROVIDER_SITE_OTHER): Payer: 59 | Admitting: Family Medicine

## 2017-08-09 ENCOUNTER — Encounter: Payer: Self-pay | Admitting: Family Medicine

## 2017-08-09 ENCOUNTER — Other Ambulatory Visit: Payer: Self-pay

## 2017-08-09 ENCOUNTER — Ambulatory Visit (INDEPENDENT_AMBULATORY_CARE_PROVIDER_SITE_OTHER): Payer: 59 | Admitting: Psychology

## 2017-08-09 VITALS — BP 138/92 | HR 106 | Temp 97.8°F | Ht 66.0 in | Wt 202.0 lb

## 2017-08-09 DIAGNOSIS — R1310 Dysphagia, unspecified: Secondary | ICD-10-CM

## 2017-08-09 DIAGNOSIS — J3089 Other allergic rhinitis: Secondary | ICD-10-CM | POA: Diagnosis not present

## 2017-08-09 DIAGNOSIS — J302 Other seasonal allergic rhinitis: Secondary | ICD-10-CM

## 2017-08-09 DIAGNOSIS — K219 Gastro-esophageal reflux disease without esophagitis: Secondary | ICD-10-CM | POA: Diagnosis not present

## 2017-08-09 DIAGNOSIS — F4322 Adjustment disorder with anxiety: Secondary | ICD-10-CM

## 2017-08-09 NOTE — Assessment & Plan Note (Signed)
Likely still present or chronic sinusitis Followed by [facility] Presumed problem with sinus draining defect, however x-ray unremarkable Advised may need advanced imaging CT vs MRI - discuss with ENT Follow-up

## 2017-08-09 NOTE — Assessment & Plan Note (Signed)
Still persistent, improved burning but still reflux on Dexilant - See prior note, consistent with LPR. Smoking history risk - AGI Dr Servando Snare, EGD 06/14/17 - Likely worsened with anxiety, sinusitis/rhinitis  Plan: 1. Continue to see [facility] - Future may need 2nd opinion GI per patient request - defer for now - Continue Dexilant - Resume Flonase - Follow-up as needed

## 2017-08-09 NOTE — Progress Notes (Signed)
Subjective:    Patient ID: Robert [name], male    DOB: 1980-09-28, 37 y.o.   MRN: [id number]  Robert [name] is a 37 y.o. male presenting on 08/09/2017 for Follow-up (swallow test) and Sinusitis   HPI   FOLLOW-UPGERD vs LPR / Swallowing Dysfunction - Last visit with me 06/25/17, for subsequent visit for same problem, see prior notes for background information. - Interval update with s/p EGD and seen by Dr Servando Snare, remains on Dexilant, no further treatment - Still has reflux but now no pain or burning, but thinks problem still present despite dexilant - He feels at times some difficulty swallowing due to throat sensation - He may pursue 2nd opinion in future if still limited improvement - Still trying to improve diet, and thinks sinus playing some role - He has had upper GI barium swallow, some "pouching" and abnormality with bolus of food intake s/p SLP evaluation, see note for details - he states he benefited from this and would like to have re-eval in future - Denies abdominal pain, nausea vomiting, unintentional weight loss, early satiety, fever chills  FOLLOW-up Sinusitis / Seasonal Allergies / Rhinitis Followed by Dr Jenne Campus, [facility], he had sinus x-ray limited problems, he still thinks prior [facility] septal surgery is related to persistent problem - He feels sinus congestion still thicker, will have slight obstruction to clear - Allergic to dust and ragweed on allergy testing - Consider send to [facility]  Depression screen [facility] 2/9 08/09/2017 06/15/2017 04/12/2017  Decreased Interest 0 0 0  Down, Depressed, Hopeless 0 0 0  PHQ - 2 Score 0 0 0  Altered sleeping 0 - -  Tired, decreased energy 0 - -  Change in appetite 0 - -  Feeling bad or failure about yourself  0 - -  Trouble concentrating 0 - -  Moving slowly or fidgety/restless 0 - -  Suicidal thoughts 0 - -  PHQ-9 Score 0 - -  Difficult doing work/chores Not difficult at all - -    Social History   Tobacco Use  . Smoking  status: Former Smoker    Packs/day: 1.00    Years: 15.00    Pack years: 15.00    Types: Cigarettes    Last attempt to quit: 10/10/2016    Years since quitting: 0.8  . Smokeless tobacco: Never Used  . Tobacco comment: Quit using vaping  Substance Use Topics  . Alcohol use: No  . Drug use: No    Review of Systems Per HPI unless specifically indicated above     Objective:    BP (!) 138/92 (BP Location: Left Arm, Patient Position: Sitting, Cuff Size: Normal)   Pulse (!) 106   Temp 97.8 F (36.6 C)   Ht 5\' 6"  (1.676 m)   Wt 202 lb (91.6 kg)   SpO2 97%   BMI 32.60 kg/m   Wt Readings from Last 3 Encounters:  08/09/17 202 lb (91.6 kg)  06/15/17 204 lb (92.5 kg)  06/14/17 193 lb (87.5 kg)    Physical Exam  Constitutional: He is oriented to person, place, and time. He appears well-developed and well-nourished. No distress.  Well-appearing, comfortable, cooperative  HENT:  Head: Normocephalic and atraumatic.  Mouth/Throat: Oropharynx is clear and moist.  Frontal / maxillary sinuses non-tender. Nares mostly patent with some narrowing deeper turbinates with deviated septum  Bilateral TM clear w/o effusion or erythema  Oropharynx clear w/o exudates, edema or asymmetry.  Frequent throat / sinus clearing noises  Eyes:  Conjunctivae are normal. Right eye exhibits no discharge. Left eye exhibits no discharge.  Neck: Normal range of motion. Neck supple.  Cardiovascular: Normal rate, regular rhythm, normal heart sounds and intact distal pulses.  Pulmonary/Chest: Effort normal and breath sounds normal. No respiratory distress. He has no wheezes. He has no rales.  Musculoskeletal: He exhibits no edema.  Lymphadenopathy:    He has no cervical adenopathy.  Neurological: He is alert and oriented to person, place, and time.  Skin: Skin is warm and dry. No rash noted. He is not diaphoretic. No erythema.  Psychiatric: He has a normal mood and affect. His behavior is normal.  Well groomed,  good eye contact, normal speech and thoughts  Nursing note and vitals reviewed.  No results found for this or any previous visit.    Assessment & Plan:   Problem List Items Addressed This Visit    Allergic rhinitis    Likely still present or chronic sinusitis Followed by [facility] Presumed problem with sinus draining defect, however x-ray unremarkable Advised may need advanced imaging CT vs MRI - discuss with ENT Follow-up      Laryngopharyngeal reflux (LPR) - Primary    Still persistent, improved burning but still reflux on Dexilant - See prior note, consistent with LPR. Smoking history risk - AGI Dr Servando Snare, EGD 06/14/17 - Likely worsened with anxiety, sinusitis/rhinitis  Plan: 1. Continue to see [facility] - Future may need 2nd opinion GI per patient request - defer for now - Continue Dexilant - Resume Flonase - Follow-up as needed      Relevant Orders   Ambulatory referral to Speech Therapy   Seasonal allergies    Other Visit Diagnoses    Swallowing dysfunction     Referral placed back to outpatient SLP for further education and assistance with swallowing    Relevant Orders   Ambulatory referral to Speech Therapy      No orders of the defined types were placed in this encounter.     Follow up plan: Return in about 3 months (around 11/06/2017), or if symptoms worsen or fail to improve, for GERD, Swallowing.  Saralyn Pilar, DO [facility] 08/09/2017, 6:22 PM

## 2017-08-09 NOTE — Patient Instructions (Addendum)
Thank you for coming to the office today.  1.  Follow back up with Dr Jenne Campus - to discuss advanced imaging CT vs MRI for sinuses in future  Continue with current plan  No antibiotics today  Sent referral to [facility] Speech Language Pathologist (SLP) - stay tuned for this - sent to Jerilynn Som SLP  If need 2nd opinion for GI let me know - consider [facility] or [facility]  Please schedule a Follow-up Appointment to: Return in about 3 months (around 11/06/2017), or if symptoms worsen or fail to improve, for GERD, Swallowing.    If you have any other questions or concerns, please feel free to call the office or send a message through MyChart. You may also schedule an earlier appointment if necessary.  Additionally, you may be receiving a survey about your experience at our office within a few days to 1 week by e-mail or mail. We value your feedback.  Saralyn Pilar, DO [facility], [place]

## 2017-08-10 ENCOUNTER — Encounter: Payer: Self-pay | Admitting: Family Medicine

## 2017-08-10 DIAGNOSIS — Z719 Counseling, unspecified: Secondary | ICD-10-CM | POA: Diagnosis not present

## 2017-08-10 DIAGNOSIS — F419 Anxiety disorder, unspecified: Secondary | ICD-10-CM

## 2017-08-13 MED ORDER — ESCITALOPRAM OXALATE 10 MG PO TABS
10.0000 mg | ORAL_TABLET | Freq: Every day | ORAL | 1 refills | Status: DC
Start: 2017-08-13 — End: 2017-10-12

## 2017-08-13 NOTE — Addendum Note (Signed)
Addended by: Smitty Cords on: 08/13/2017 08:17 AM   Modules accepted: Orders

## 2017-08-16 ENCOUNTER — Ambulatory Visit (INDEPENDENT_AMBULATORY_CARE_PROVIDER_SITE_OTHER): Payer: 59 | Admitting: Psychology

## 2017-08-16 DIAGNOSIS — F4322 Adjustment disorder with anxiety: Secondary | ICD-10-CM | POA: Diagnosis not present

## 2017-08-17 DIAGNOSIS — Z719 Counseling, unspecified: Secondary | ICD-10-CM | POA: Diagnosis not present

## 2017-08-24 ENCOUNTER — Ambulatory Visit (INDEPENDENT_AMBULATORY_CARE_PROVIDER_SITE_OTHER): Payer: 59 | Admitting: Psychology

## 2017-08-24 DIAGNOSIS — Z719 Counseling, unspecified: Secondary | ICD-10-CM | POA: Diagnosis not present

## 2017-08-24 DIAGNOSIS — F4322 Adjustment disorder with anxiety: Secondary | ICD-10-CM

## 2017-08-30 ENCOUNTER — Ambulatory Visit (INDEPENDENT_AMBULATORY_CARE_PROVIDER_SITE_OTHER): Payer: 59 | Admitting: Psychology

## 2017-08-30 DIAGNOSIS — F4322 Adjustment disorder with anxiety: Secondary | ICD-10-CM

## 2017-08-31 DIAGNOSIS — Z719 Counseling, unspecified: Secondary | ICD-10-CM | POA: Diagnosis not present

## 2017-09-06 ENCOUNTER — Ambulatory Visit (INDEPENDENT_AMBULATORY_CARE_PROVIDER_SITE_OTHER): Payer: 59 | Admitting: Psychology

## 2017-09-06 DIAGNOSIS — F4322 Adjustment disorder with anxiety: Secondary | ICD-10-CM

## 2017-09-07 DIAGNOSIS — Z719 Counseling, unspecified: Secondary | ICD-10-CM | POA: Diagnosis not present

## 2017-09-13 ENCOUNTER — Ambulatory Visit (INDEPENDENT_AMBULATORY_CARE_PROVIDER_SITE_OTHER): Payer: 59 | Admitting: Psychology

## 2017-09-13 DIAGNOSIS — F4322 Adjustment disorder with anxiety: Secondary | ICD-10-CM | POA: Diagnosis not present

## 2017-09-20 ENCOUNTER — Encounter: Payer: Self-pay | Admitting: Family Medicine

## 2017-09-27 ENCOUNTER — Ambulatory Visit: Payer: 59 | Admitting: Psychology

## 2017-10-05 ENCOUNTER — Ambulatory Visit (INDEPENDENT_AMBULATORY_CARE_PROVIDER_SITE_OTHER): Payer: 59 | Admitting: Psychology

## 2017-10-05 DIAGNOSIS — F4322 Adjustment disorder with anxiety: Secondary | ICD-10-CM

## 2017-10-11 ENCOUNTER — Encounter: Payer: Self-pay | Admitting: Family Medicine

## 2017-10-11 DIAGNOSIS — F419 Anxiety disorder, unspecified: Secondary | ICD-10-CM

## 2017-10-12 ENCOUNTER — Ambulatory Visit (INDEPENDENT_AMBULATORY_CARE_PROVIDER_SITE_OTHER): Payer: 59 | Admitting: Psychology

## 2017-10-12 DIAGNOSIS — F4322 Adjustment disorder with anxiety: Secondary | ICD-10-CM | POA: Diagnosis not present

## 2017-10-12 MED ORDER — ESCITALOPRAM OXALATE 20 MG PO TABS
20.0000 mg | ORAL_TABLET | Freq: Every day | ORAL | 1 refills | Status: DC
Start: 2017-10-12 — End: 2018-02-04

## 2017-10-25 ENCOUNTER — Other Ambulatory Visit: Payer: Self-pay | Admitting: Unknown Physician Specialty

## 2017-10-25 DIAGNOSIS — R070 Pain in throat: Secondary | ICD-10-CM | POA: Diagnosis not present

## 2017-10-25 DIAGNOSIS — R1310 Dysphagia, unspecified: Secondary | ICD-10-CM | POA: Diagnosis not present

## 2017-10-25 DIAGNOSIS — R221 Localized swelling, mass and lump, neck: Principal | ICD-10-CM

## 2017-10-25 DIAGNOSIS — R220 Localized swelling, mass and lump, head: Secondary | ICD-10-CM

## 2017-10-26 ENCOUNTER — Ambulatory Visit (INDEPENDENT_AMBULATORY_CARE_PROVIDER_SITE_OTHER): Payer: 59 | Admitting: Psychology

## 2017-10-26 DIAGNOSIS — F4320 Adjustment disorder, unspecified: Secondary | ICD-10-CM | POA: Diagnosis not present

## 2017-11-01 ENCOUNTER — Other Ambulatory Visit: Payer: Self-pay | Admitting: Family Medicine

## 2017-11-01 ENCOUNTER — Encounter: Payer: Self-pay | Admitting: Family Medicine

## 2017-11-01 ENCOUNTER — Ambulatory Visit (INDEPENDENT_AMBULATORY_CARE_PROVIDER_SITE_OTHER): Payer: 59 | Admitting: Family Medicine

## 2017-11-01 ENCOUNTER — Ambulatory Visit
Admission: RE | Admit: 2017-11-01 | Discharge: 2017-11-01 | Disposition: A | Discharge status: Home / Self Care | Payer: 59 | Source: Ambulatory Visit | Attending: Unknown Physician Specialty | Admitting: Unknown Physician Specialty

## 2017-11-01 VITALS — BP 113/68 | HR 83 | Temp 97.9°F | Resp 16 | Ht 66.0 in | Wt 211.6 lb

## 2017-11-01 DIAGNOSIS — F419 Anxiety disorder, unspecified: Secondary | ICD-10-CM

## 2017-11-01 DIAGNOSIS — R220 Localized swelling, mass and lump, head: Secondary | ICD-10-CM

## 2017-11-01 DIAGNOSIS — R221 Localized swelling, mass and lump, neck: Secondary | ICD-10-CM | POA: Diagnosis not present

## 2017-11-01 DIAGNOSIS — K219 Gastro-esophageal reflux disease without esophagitis: Secondary | ICD-10-CM | POA: Diagnosis not present

## 2017-11-01 DIAGNOSIS — R590 Localized enlarged lymph nodes: Secondary | ICD-10-CM | POA: Insufficient documentation

## 2017-11-01 DIAGNOSIS — Z0000 Encounter for general adult medical examination without abnormal findings: Secondary | ICD-10-CM

## 2017-11-01 DIAGNOSIS — R7309 Other abnormal glucose: Secondary | ICD-10-CM

## 2017-11-01 NOTE — Progress Notes (Addendum)
Subjective:    Patient ID: Robert [name], male    DOB: 1980-11-26, 37 y.o.   MRN: [id number]  Omarian Jaquith is a 37 y.o. male presenting on 11/01/2017 for Gastroesophageal Reflux (improved)   HPI   Follow-up GERD vs LPR - Recently seen [facility] Dr Jenne Campus about 1 week ago. Few weeks ago he an audible "pop" in throat when he was looking down and then he looked up and felt a significant pain in neck - Today he has [place] scheduled at [facility] for neck soft tissue ultrasound - They have not determined exact cause - He does have vocal ticks and thinks that problems worse after quitting smoking - Taking Flonase (name brand OTC), same price as generic - He has problem with sneezing internally at times >1 year ago, since flare up in December, he has difficulty with sneezing and he has pain with sneezing  Anxiety, unspecified He was previously taking Escitalopram 10mg  daily, but gradually reduced down to 5mg , due to possible side effect with thought pain and pressure in throat. He then decided to increase dose back up beyond 10 and go to 20mg  daily about 3 weeks ago. He still admits he feels more "chill" but overall difficulty attributing it to the medicine. Following with therapist, trying to reduce stress and physical muscle strain with neck and upper back muscles when he is working on computer and driving to and from work. He attributes a lot of stressors back in 05/2017 with higher GAD to several stressors. - He seems to not worry as much now in general, less stressors, he is working on strategy with his therapist to minimize stress, reducing work load and planning ahead in future better - See GAD score, he was not aware of his higher score from 05/2017, at first he did not think much had changed - Denies depression, panic attacks, insomnia   Depression screen [facility] 2/9 11/01/2017 08/09/2017 06/15/2017  Decreased Interest 0 0 0  Down, Depressed, Hopeless 0 0 0  PHQ - 2 Score 0 0 0  Altered sleeping  - 0 -  Tired, decreased energy - 0 -  Change in appetite - 0 -  Feeling bad or failure about yourself  - 0 -  Trouble concentrating - 0 -  Moving slowly or fidgety/restless - 0 -  Suicidal thoughts - 0 -  PHQ-9 Score - 0 -  Difficult doing work/chores - Not difficult at all -   GAD 7 : Generalized Anxiety Score 11/01/2017 05/23/2017  Nervous, Anxious, on Edge 0 1  Control/stop worrying 0 0  Worry too much - different things 1 2  Trouble relaxing 1 3  Restless 0 3  Easily annoyed or irritable 0 0  Afraid - awful might happen 0 1  Total GAD 7 Score 2 10  Anxiety Difficulty Not difficult at all Somewhat difficult    Social History   Tobacco Use  . Smoking status: Former Smoker    Packs/day: 1.00    Years: 15.00    Pack years: 15.00    Types: Cigarettes    Last attempt to quit: 10/10/2016    Years since quitting: 1.0  . Smokeless tobacco: Never Used  . Tobacco comment: Quit using vaping  Substance Use Topics  . Alcohol use: No  . Drug use: No    Review of Systems Per HPI unless specifically indicated above     Objective:    BP 113/68   Pulse 83   Temp 97.9 F (  36.6 C) (Oral)   Resp 16   Ht 5\' 6"  (1.676 m)   Wt 211 lb 9.6 oz (96 kg)   BMI 34.15 kg/m   Wt Readings from Last 3 Encounters:  11/01/17 211 lb 9.6 oz (96 kg)  08/09/17 202 lb (91.6 kg)  06/15/17 204 lb (92.5 kg)    Physical Exam  Constitutional: He is oriented to person, place, and time. He appears well-developed and well-nourished. No distress.  Well-appearing, comfortable, cooperative  HENT:  Head: Normocephalic and atraumatic.  Mouth/Throat: Oropharynx is clear and moist.  Reduced frequency of vocal tick and throat / sinus clearing noises  Eyes: Conjunctivae are normal. Right eye exhibits no discharge. Left eye exhibits no discharge.  Neck: Normal range of motion.  Cardiovascular: Normal rate, regular rhythm, normal heart sounds and intact distal pulses.  Pulmonary/Chest: Effort normal. No  respiratory distress.  Musculoskeletal: He exhibits no edema.  Neurological: He is alert and oriented to person, place, and time.  Skin: Skin is warm and dry. No rash noted. He is not diaphoretic. No erythema.  Psychiatric: He has a normal mood and affect. His behavior is normal.  Well groomed, good eye contact, normal speech and thoughts. Not anxious appearing  Nursing note and vitals reviewed.  No results found for this or any previous visit.    Assessment & Plan:   Problem List Items Addressed This Visit    Anxiety    Uncertain exact diagnosis, does not seem GAD, but seems more situational with inc life stressors worse anxiety, seems related to his heartburn GERD and can flare up, also has history of vocal / sinus throat clearing ticks, seem related to stress and GERD - Now seems improved with better control of GERD on high dose PPI - Also improved on SSRI Lexapro, he is not attributing much improvement to SSRI, more to change in stressors No significant secondary complications from anxiety -GAD7: improved 10 to 2, not difficult / PHQ9: 0 - No psychiatry but follows with therapist regularly with improvement  Plan: 1. Recommend to continue Escitalopram if he feels it is helpful, otherwise may taper when ready to see if not needed - he has 20mg  tabs enough, will continue for now then taper down to half tab for 10mg  1-2 week then down to half of his existing 10mg  tabs for dose = 5mg  for 1-2 week then off - Continue w/ therapist - Plan to f/u for annual in 4 months with labs - Return as needed to discuss anxiety and med adjust      GERD (gastroesophageal reflux disease) - Primary    Improved heartburn and reflux on Dexilant per AGI - See prior note, consistent with LPR / GERD in past. Smoking history risk - AGI Dr Servando Snare, EGD 06/14/17 - Likely worsened with anxiety (now improved), sinusitis/rhinitis (now resolved)  Plan: 1. Continue to see [facility] an GI - agree with high dose PPI  Dexilant seems to be controlling more of his LPR symptoms - Continue Dexilant - Continue Flonase - Follow-up as needed  Additionally - for his constellation of pharyngeal / throat symptoms with pain and positional changes and voice etc, followed by ENT, seems less likely only due to GERD but it is possible. He is pursuing now neck soft tissue US soon per their order, as no obvious etiology identified on recent exam         No orders of the defined types were placed in this encounter.   Follow up plan:  Return in about 4 months (around 03/04/2018) for Annual Physical.  Future labs ordered for 02/21/18  Saralyn Pilar, DO [facility] 11/01/2017, 4:13 PM

## 2017-11-01 NOTE — Assessment & Plan Note (Addendum)
Improved heartburn and reflux on Dexilant per AGI - See prior note, consistent with LPR / GERD in past. Smoking history risk - AGI Dr Servando Snare, EGD 06/14/17 - Likely worsened with anxiety (now improved), sinusitis/rhinitis (now resolved)  Plan: 1. Continue to see [facility] an GI - agree with high dose PPI Dexilant seems to be controlling more of his LPR symptoms - Continue Dexilant - Continue Flonase - Follow-up as needed  Additionally - for his constellation of pharyngeal / throat symptoms with pain and positional changes and voice etc, followed by ENT, seems less likely only due to GERD but it is possible. He is pursuing now neck soft tissue US soon per their order, as no obvious etiology identified on recent exam

## 2017-11-01 NOTE — Assessment & Plan Note (Addendum)
Uncertain exact diagnosis, does not seem GAD, but seems more situational with inc life stressors worse anxiety, seems related to his heartburn GERD and can flare up, also has history of vocal / sinus throat clearing ticks, seem related to stress and GERD - Now seems improved with better control of GERD on high dose PPI - Also improved on SSRI Lexapro, he is not attributing much improvement to SSRI, more to change in stressors No significant secondary complications from anxiety -GAD7: improved 10 to 2, not difficult / PHQ9: 0 - No psychiatry but follows with therapist regularly with improvement  Plan: 1. Recommend to continue Escitalopram if he feels it is helpful, otherwise may taper when ready to see if not needed - he has 20mg  tabs enough, will continue for now then taper down to half tab for 10mg  1-2 week then down to half of his existing 10mg  tabs for dose = 5mg  for 1-2 week then off - Continue w/ therapist - Plan to f/u for annual in 4 months with labs - Return as needed to discuss anxiety and med adjust

## 2017-11-01 NOTE — Patient Instructions (Addendum)
Thank you for coming to the office today.  Keep up the good work overall  I think the strategies you use to control anxiety are appropriate and seem to be working well  May continue Lexapro as you need for now - when ready try to taper down by half dose to 10mg  for 1-2 weeks then down to 5mg , for 1-2 weeks - call if you need new rx of lower.   DUE for FASTING BLOOD WORK (no food or drink after midnight before the lab appointment, only water or coffee without cream/sugar on the morning of)  SCHEDULE "Lab Only" visit in the morning at the clinic for lab draw in 4 MONTHS   - Make sure Lab Only appointment is at about 1 week before your next appointment, so that results will be available  For Lab Results, once available within 2-3 days of blood draw, you can can log in to MyChart online to view your results and a brief explanation. Also, we can discuss results at next follow-up visit.  Please schedule a Follow-up Appointment to: Return in about 4 months (around 03/04/2018) for Annual Physical.  If you have any other questions or concerns, please feel free to call the office or send a message through MyChart. You may also schedule an earlier appointment if necessary.  Additionally, you may be receiving a survey about your experience at our office within a few days to 1 week by e-mail or mail. We value your feedback.  Saralyn Pilar, DO [facility], [place]

## 2017-11-09 ENCOUNTER — Ambulatory Visit (INDEPENDENT_AMBULATORY_CARE_PROVIDER_SITE_OTHER): Payer: 59 | Admitting: Psychology

## 2017-11-09 DIAGNOSIS — F4320 Adjustment disorder, unspecified: Secondary | ICD-10-CM

## 2017-11-12 ENCOUNTER — Other Ambulatory Visit: Payer: Self-pay | Admitting: Unknown Physician Specialty

## 2017-11-12 DIAGNOSIS — R221 Localized swelling, mass and lump, neck: Secondary | ICD-10-CM

## 2017-11-30 ENCOUNTER — Ambulatory Visit (INDEPENDENT_AMBULATORY_CARE_PROVIDER_SITE_OTHER): Payer: 59 | Admitting: Psychology

## 2017-11-30 DIAGNOSIS — F4320 Adjustment disorder, unspecified: Secondary | ICD-10-CM

## 2017-12-03 ENCOUNTER — Other Ambulatory Visit: Payer: Self-pay | Admitting: Gastroenterology

## 2017-12-06 ENCOUNTER — Telehealth: Payer: Self-pay | Admitting: Gastroenterology

## 2017-12-06 ENCOUNTER — Other Ambulatory Visit: Payer: Self-pay

## 2017-12-06 MED ORDER — DEXLANSOPRAZOLE 60 MG PO CPDR: 60.0000 mg | DELAYED_RELEASE_CAPSULE | Freq: Every day | ORAL | 6 refills | Status: DC

## 2017-12-06 NOTE — Telephone Encounter (Signed)
Pt left vm for refill rx Dexalint 60 km send to [facility] in [place]

## 2018-01-04 ENCOUNTER — Ambulatory Visit (INDEPENDENT_AMBULATORY_CARE_PROVIDER_SITE_OTHER): Payer: 59 | Admitting: Psychology

## 2018-01-04 DIAGNOSIS — F4320 Adjustment disorder, unspecified: Secondary | ICD-10-CM | POA: Diagnosis not present

## 2018-01-10 ENCOUNTER — Encounter: Payer: Self-pay | Admitting: Family Medicine

## 2018-01-10 ENCOUNTER — Ambulatory Visit (INDEPENDENT_AMBULATORY_CARE_PROVIDER_SITE_OTHER): Payer: 59 | Admitting: Family Medicine

## 2018-01-10 VITALS — BP 118/73 | HR 81 | Temp 97.7°F | Resp 16 | Ht 66.0 in | Wt 218.0 lb

## 2018-01-10 DIAGNOSIS — Z87898 Personal history of other specified conditions: Secondary | ICD-10-CM

## 2018-01-10 DIAGNOSIS — R42 Dizziness and giddiness: Secondary | ICD-10-CM | POA: Diagnosis not present

## 2018-01-10 DIAGNOSIS — H9313 Tinnitus, bilateral: Secondary | ICD-10-CM | POA: Diagnosis not present

## 2018-01-10 NOTE — Patient Instructions (Addendum)
Thank you for coming to the office today.  ----------------------------------------------- Referral placed today - stay tuned for apt - call them within 2 weeks.  [facility] Neu[facility] [address] [place], [place] [address] Phone: [phone]  ----------------------------------------------  Dr Naomie Dean (Migraine Specialist)  [facility]   Address: [address] [place], [place] [address] Hours: 8AM-5PM Phone: [phone]  [facility] [address] [place] [place], [place] [address] Phone: [phone]   Please schedule a Follow-up Appointment to: Return if symptoms worsen or fail to improve, for 2-4 weeks if worsening episodes dizziness.  If you have any other questions or concerns, please feel free to call the office or send a message through MyChart. You may also schedule an earlier appointment if necessary.  Additionally, you may be receiving a survey about your experience at our office within a few days to 1 week by e-mail or mail. We value your feedback.  Saralyn Pilar, DO [facility], [place]

## 2018-01-10 NOTE — Progress Notes (Signed)
Subjective:    Patient ID: Robert [name], male    DOB: 01-05-1981, 37 y.o.   MRN: [id number]  Robert [name] is a 37 y.o. male presenting on 01/10/2018 for Dizziness (onset week ) and Tinnitus   HPI   DIZZINESS vs Vertigo Episodes / Tinnitus Reports symptoms of dizziness first started >1 week ago, he does not think it is vertigo compared to previous episodes in past he has done Epley maneuver before it has been years or a while ago that this happened, it usually helps resolve it. - Now current problem admits associated tinnitus and a "pulsation" or "echo" in both ears and feels that his hearing "goes in and out", usually triggered by standing up, but if he turns head or moves eyes it will exacerbate it and can trigger symptoms, brief episodes lasting few seconds to minute then resolve - Trigger standing up to walk and while up seems to trigger - He also admits a history of Tourette's - History of anxiety - he has been tried on Lexapro in past recently but limited results. He now tapered off Lexapro 1.5 to 2 weeks ago - thinks it may be related to current symptoms due to timing. He may try to restart lexapro see if improves - Not checking his BP at home - Admits maybe tingling in fingers Denies any focal numbness weakness headache loss of vision, dyspnea, near syncope  Depression screen [facility] 2/9 01/10/2018 11/01/2017 08/09/2017  Decreased Interest 0 0 0  Down, Depressed, Hopeless 0 0 0  PHQ - 2 Score 0 0 0  Altered sleeping - - 0  Tired, decreased energy - - 0  Change in appetite - - 0  Feeling bad or failure about yourself  - - 0  Trouble concentrating - - 0  Moving slowly or fidgety/restless - - 0  Suicidal thoughts - - 0  PHQ-9 Score - - 0  Difficult doing work/chores - - Not difficult at all    Social History   Tobacco Use  . Smoking status: Former Smoker    Packs/day: 1.00    Years: 15.00    Pack years: 15.00    Types: Cigarettes    Last attempt to quit: 10/10/2016    Years  since quitting: 1.2  . Smokeless tobacco: Never Used  . Tobacco comment: Quit using vaping  Substance Use Topics  . Alcohol use: No  . Drug use: No    Review of Systems Per HPI unless specifically indicated above     Objective:     Orthostatic VS for the past 24 hrs (Last 3 readings):  BP- Lying Pulse- Lying BP- Sitting Pulse- Sitting BP- Standing at 0 minutes Pulse- Standing at 0 minutes  01/10/18 0928 118/73 72 127/83 72 115/81 81     BP 118/73   Pulse 81   Temp 97.7 F (36.5 C) (Oral)   Resp 16   Ht 5\' 6"  (1.676 m)   Wt 218 lb (98.9 kg)   SpO2 98%   BMI 35.19 kg/m   Wt Readings from Last 3 Encounters:  01/10/18 218 lb (98.9 kg)  11/01/17 211 lb 9.6 oz (96 kg)  08/09/17 202 lb (91.6 kg)    Physical Exam  Constitutional: He is oriented to person, place, and time. He appears well-developed and well-nourished. No distress.  Well-appearing, comfortable, cooperative  HENT:  Head: Normocephalic and atraumatic.  Mouth/Throat: Oropharynx is clear and moist.  Frontal / maxillary sinuses non-tender. Nares patent without purulence or edema.  Bilateral TMs clear without erythema, effusion or bulging. Oropharynx clear without erythema, exudates, edema or asymmetry.  Dix-Hallpike maneuver negative, not provoked vertigo on exam, bilateral. No nystagmus.  Eyes: Conjunctivae are normal. Right eye exhibits no discharge. Left eye exhibits no discharge.  Neck: Normal range of motion. Neck supple.  Cardiovascular: Normal rate, regular rhythm, normal heart sounds and intact distal pulses.  No murmur heard. Pulmonary/Chest: Effort normal and breath sounds normal. No respiratory distress. He has no wheezes. He has no rales.  Musculoskeletal: Normal range of motion. He exhibits no edema.  Neurological: He is alert and oriented to person, place, and time.  Skin: Skin is warm and dry. No rash noted. He is not diaphoretic. No erythema.  Psychiatric: He has a normal mood and affect. His  behavior is normal.  Well groomed, good eye contact, normal speech and thoughts. Mildly anxious at baseline.  Nursing note and vitals reviewed.  No results found for this or any previous visit.    Assessment & Plan:   Problem List Items Addressed This Visit    History of vertigo   Relevant Orders   Ambulatory referral to Neurology   Tinnitus, bilateral   Relevant Orders   Ambulatory referral to Neurology    Other Visit Diagnoses    Multisensory dizziness    -  Primary   Relevant Orders   Ambulatory referral to Neurology      Clinically symptom constellation recent flare of seemingly chronic issues, concerning for possible vertigo vs dizziness episodes, and associated tinnitus. Question if vertigo possibly BPPV but seems less likely if not provoked on exam and per patients prior history not as consistent. Concern for other underlying neurological etiology. Also with possible Tourette's diagnosis in past unsure if this could be related as well. - Previously established with [facility]  Plan Referral to [facility] for further diagnostic and 2nd opinion, may benefit from advanced imaging such as MRI  No orders of the defined types were placed in this encounter.  Orders Placed This Encounter  Procedures  . Ambulatory referral to Neurology    Referral Priority:   Routine    Referral Type:   Consultation    Referral Reason:   Specialty Services Required    Referred to Provider:   Lonell Face, MD    Requested Specialty:   Neurology    Number of Visits Requested:   1    Follow up plan: Return if symptoms worsen or fail to improve, for 2-4 weeks if worsening episodes dizziness.  Saralyn Pilar, DO [facility] 01/10/2018, 12:04 PM

## 2018-01-17 ENCOUNTER — Ambulatory Visit
Admission: RE | Admit: 2018-01-17 | Discharge: 2018-01-17 | Disposition: A | Discharge status: Home / Self Care | Payer: 59 | Source: Ambulatory Visit | Attending: Unknown Physician Specialty | Admitting: Unknown Physician Specialty

## 2018-01-17 DIAGNOSIS — R220 Localized swelling, mass and lump, head: Secondary | ICD-10-CM | POA: Diagnosis not present

## 2018-01-17 DIAGNOSIS — R590 Localized enlarged lymph nodes: Secondary | ICD-10-CM | POA: Diagnosis not present

## 2018-01-17 DIAGNOSIS — R221 Localized swelling, mass and lump, neck: Secondary | ICD-10-CM

## 2018-01-25 ENCOUNTER — Ambulatory Visit (INDEPENDENT_AMBULATORY_CARE_PROVIDER_SITE_OTHER): Payer: 59 | Admitting: Psychology

## 2018-01-25 DIAGNOSIS — F4320 Adjustment disorder, unspecified: Secondary | ICD-10-CM

## 2018-02-04 ENCOUNTER — Other Ambulatory Visit: Payer: Self-pay | Admitting: Family Medicine

## 2018-02-04 DIAGNOSIS — F419 Anxiety disorder, unspecified: Secondary | ICD-10-CM

## 2018-02-04 MED ORDER — ESCITALOPRAM OXALATE 10 MG PO TABS: 10.0000 mg | ORAL_TABLET | Freq: Every day | ORAL | 3 refills | Status: DC

## 2018-02-07 ENCOUNTER — Ambulatory Visit (INDEPENDENT_AMBULATORY_CARE_PROVIDER_SITE_OTHER): Payer: 59 | Admitting: Psychology

## 2018-02-07 DIAGNOSIS — F4320 Adjustment disorder, unspecified: Secondary | ICD-10-CM | POA: Diagnosis not present

## 2018-02-14 DIAGNOSIS — R42 Dizziness and giddiness: Secondary | ICD-10-CM | POA: Insufficient documentation

## 2018-02-14 DIAGNOSIS — G35 Multiple sclerosis: Secondary | ICD-10-CM | POA: Diagnosis not present

## 2018-02-14 DIAGNOSIS — R292 Abnormal reflex: Secondary | ICD-10-CM | POA: Diagnosis not present

## 2018-02-15 ENCOUNTER — Other Ambulatory Visit: Payer: Self-pay | Admitting: Family Medicine

## 2018-02-15 ENCOUNTER — Other Ambulatory Visit: Payer: Self-pay | Admitting: Neurology

## 2018-02-15 DIAGNOSIS — Z0000 Encounter for general adult medical examination without abnormal findings: Secondary | ICD-10-CM

## 2018-02-15 DIAGNOSIS — R42 Dizziness and giddiness: Secondary | ICD-10-CM

## 2018-02-15 DIAGNOSIS — K219 Gastro-esophageal reflux disease without esophagitis: Secondary | ICD-10-CM

## 2018-02-15 DIAGNOSIS — R635 Abnormal weight gain: Secondary | ICD-10-CM

## 2018-02-15 DIAGNOSIS — F419 Anxiety disorder, unspecified: Secondary | ICD-10-CM

## 2018-02-15 DIAGNOSIS — R7989 Other specified abnormal findings of blood chemistry: Secondary | ICD-10-CM

## 2018-02-15 DIAGNOSIS — R7309 Other abnormal glucose: Secondary | ICD-10-CM

## 2018-02-19 DIAGNOSIS — R292 Abnormal reflex: Secondary | ICD-10-CM | POA: Insufficient documentation

## 2018-02-19 DIAGNOSIS — R1310 Dysphagia, unspecified: Secondary | ICD-10-CM | POA: Insufficient documentation

## 2018-02-19 DIAGNOSIS — G35 Multiple sclerosis: Secondary | ICD-10-CM | POA: Insufficient documentation

## 2018-02-21 ENCOUNTER — Other Ambulatory Visit: Payer: 59

## 2018-02-21 DIAGNOSIS — K219 Gastro-esophageal reflux disease without esophagitis: Secondary | ICD-10-CM

## 2018-02-21 DIAGNOSIS — F419 Anxiety disorder, unspecified: Secondary | ICD-10-CM

## 2018-02-21 DIAGNOSIS — R7309 Other abnormal glucose: Secondary | ICD-10-CM

## 2018-02-21 DIAGNOSIS — R7989 Other specified abnormal findings of blood chemistry: Secondary | ICD-10-CM | POA: Diagnosis not present

## 2018-02-21 DIAGNOSIS — Z0000 Encounter for general adult medical examination without abnormal findings: Secondary | ICD-10-CM

## 2018-02-21 DIAGNOSIS — R635 Abnormal weight gain: Secondary | ICD-10-CM

## 2018-02-22 LAB — TESTOSTERONE TOTAL,FREE,BIO, MALES
Albumin: 4.9 g/dL (ref 3.6–5.1)
SEX HORMONE BINDING: 15 nmol/L (ref 10–50)
TESTOSTERONE: 235 ng/dL — AB (ref 250–827)

## 2018-02-22 LAB — COMPLETE METABOLIC PANEL WITH GFR
AG Ratio: 1.8 (calc) (ref 1.0–2.5)
ALBUMIN MSPROF: 4.9 g/dL (ref 3.6–5.1)
ALKALINE PHOSPHATASE (APISO): 55 U/L (ref 40–115)
ALT: 28 U/L (ref 9–46)
AST: 18 U/L (ref 10–40)
BILIRUBIN TOTAL: 0.4 mg/dL (ref 0.2–1.2)
BUN: 14 mg/dL (ref 7–25)
CHLORIDE: 104 mmol/L (ref 98–110)
CO2: 26 mmol/L (ref 20–32)
CREATININE: 1.11 mg/dL (ref 0.60–1.35)
Calcium: 9.9 mg/dL (ref 8.6–10.3)
GFR, EST AFRICAN AMERICAN: 99 mL/min/{1.73_m2} (ref >=60)
GFR, Est Non African American: 86 mL/min/{1.73_m2} (ref >=60)
GLOBULIN: 2.7 g/dL (ref 1.9–3.7)
Glucose, Bld: 83 mg/dL (ref 65–99)
POTASSIUM: 4.2 mmol/L (ref 3.5–5.3)
Sodium: 140 mmol/L (ref 135–146)
Total Protein: 7.6 g/dL (ref 6.1–8.1)

## 2018-02-22 LAB — CBC WITH DIFFERENTIAL/PLATELET
BASOS ABS: 37 {cells}/uL (ref 0–200)
Basophils Relative: 0.5 %
EOS PCT: 3.3 %
Eosinophils Absolute: 241 cells/uL (ref 15–500)
HEMATOCRIT: 48.7 % (ref 38.5–50.0)
HEMOGLOBIN: 16.2 g/dL (ref 13.2–17.1)
Lymphs Abs: 2489 cells/uL (ref 850–3900)
MCH: 28.6 pg (ref 27.0–33.0)
MCHC: 33.3 g/dL (ref 32.0–36.0)
MCV: 86 fL (ref 80.0–100.0)
MPV: 9.8 fL (ref 7.5–12.5)
Monocytes Relative: 9.1 %
Neutro Abs: 3869 cells/uL (ref 1500–7800)
Neutrophils Relative %: 53 %
Platelets: 239 10*3/uL (ref 140–400)
RBC: 5.66 10*6/uL (ref 4.20–5.80)
RDW: 12.5 % (ref 11.0–15.0)
Total Lymphocyte: 34.1 %
WBC mixed population: 664 cells/uL (ref 200–950)
WBC: 7.3 10*3/uL (ref 3.8–10.8)

## 2018-02-22 LAB — LIPID PANEL
Cholesterol: 173 mg/dL (ref <200)
HDL: 48 mg/dL (ref >40)
LDL CHOLESTEROL (CALC): 98 mg/dL
Non-HDL Cholesterol (Calc): 125 mg/dL (calc) (ref <130)
TRIGLYCERIDES: 171 mg/dL — AB (ref <150)
Total CHOL/HDL Ratio: 3.6 (calc) (ref <5.0)

## 2018-02-22 LAB — HEMOGLOBIN A1C
Hgb A1c MFr Bld: 5.6 % of total Hgb (ref <5.7)
MEAN PLASMA GLUCOSE: 114 (calc)
eAG (mmol/L): 6.3 (calc)

## 2018-02-22 LAB — TSH: TSH: 1.04 mIU/L (ref 0.40–4.50)

## 2018-02-22 LAB — T4, FREE: Free T4: 1.1 ng/dL (ref 0.8–1.8)

## 2018-02-25 ENCOUNTER — Encounter: Payer: Self-pay | Admitting: Family Medicine

## 2018-02-25 DIAGNOSIS — R7309 Other abnormal glucose: Secondary | ICD-10-CM | POA: Insufficient documentation

## 2018-02-28 ENCOUNTER — Ambulatory Visit (INDEPENDENT_AMBULATORY_CARE_PROVIDER_SITE_OTHER): Payer: 59 | Admitting: Family Medicine

## 2018-02-28 ENCOUNTER — Encounter: Payer: Self-pay | Admitting: Family Medicine

## 2018-02-28 ENCOUNTER — Ambulatory Visit: Payer: 59 | Admitting: Psychology

## 2018-02-28 ENCOUNTER — Ambulatory Visit
Admission: RE | Admit: 2018-02-28 | Discharge: 2018-02-28 | Disposition: A | Discharge status: Home / Self Care | Payer: 59 | Source: Ambulatory Visit | Attending: Neurology | Admitting: Neurology

## 2018-02-28 VITALS — BP 117/81 | HR 93 | Temp 98.3°F | Resp 16 | Ht 66.0 in | Wt 217.0 lb

## 2018-02-28 DIAGNOSIS — R1310 Dysphagia, unspecified: Secondary | ICD-10-CM | POA: Insufficient documentation

## 2018-02-28 DIAGNOSIS — Z0000 Encounter for general adult medical examination without abnormal findings: Secondary | ICD-10-CM

## 2018-02-28 DIAGNOSIS — R42 Dizziness and giddiness: Secondary | ICD-10-CM

## 2018-02-28 DIAGNOSIS — R7309 Other abnormal glucose: Secondary | ICD-10-CM

## 2018-02-28 DIAGNOSIS — F419 Anxiety disorder, unspecified: Secondary | ICD-10-CM | POA: Diagnosis not present

## 2018-02-28 DIAGNOSIS — J302 Other seasonal allergic rhinitis: Secondary | ICD-10-CM | POA: Diagnosis not present

## 2018-02-28 DIAGNOSIS — R292 Abnormal reflex: Secondary | ICD-10-CM | POA: Insufficient documentation

## 2018-02-28 DIAGNOSIS — G35 Multiple sclerosis: Secondary | ICD-10-CM | POA: Insufficient documentation

## 2018-02-28 DIAGNOSIS — E669 Obesity, unspecified: Secondary | ICD-10-CM

## 2018-02-28 NOTE — Assessment & Plan Note (Signed)
Stable mostly controlled Continue anti histamine, nasal steroid

## 2018-02-28 NOTE — Assessment & Plan Note (Signed)
Mild elevated A1c, but still normal range 5.5 > 5.6  Encourage improve diet, low carb optoins reviewed and handout given Follow-up with yealry lab A1c, return in 6 months may consider add POC A1c if indicated

## 2018-02-28 NOTE — Assessment & Plan Note (Signed)
Clinically seems improved back on SSRI, see PHQ/GAD score  Plan Continue lexapro 10mg  daily, future consider dosage adjust Continue f/u Psychology Lennice Sites LCSW Follow-up if worsening

## 2018-02-28 NOTE — Progress Notes (Signed)
Subjective:    Patient ID: Robert [name], male    DOB: [birth year], 37 y.o.   MRN: [id number]  Robert [name] is a 37 y.o. male presenting on 02/28/2018 for Annual Exam  HPI   Here for Annual Physical and Lab Review.  Mild elevated A1c Recent trend 5.5 (2016) > 5.6  - High meat diet, not limiting carbs  Elevated Triglycerides - Last lipid panel 02/2018, mild elevated TG otherwise controlled  - Not on statin or other cholesterol med  Obesity BMI >35 / Abnormal weight gain Wt gain since March 2019 - gained 30 lbs in 1 year, shifted from night to day shift.  harder time losing weight overall Lifestyle - does limited breakfast, has a belvita only - he is sedentary - No significant genetic component, he was able to lose wt in past  Anxiety / Tourette's Previously he DC'd Lexapro due to possible side effects. He thought was ineffective as well, then when stopped realized it may have been helping. He has since restarted Lexapro recently.  Limited benefit on lexapro, but continuing now thinks some benefit, but difficult to say  Additional updates Followed by Neurology, had MRI done, awaiting results.  Health Maintenance:  Due for Flu Shot, declines today despite counseling on benefits  Declines TDap  Depression screen [facility] 2/9 02/28/2018 01/10/2018 11/01/2017  Decreased Interest 0 0 0  Down, Depressed, Hopeless 0 0 0  PHQ - 2 Score 0 0 0  Altered sleeping - - -  Tired, decreased energy - - -  Change in appetite - - -  Feeling bad or failure about yourself  - - -  Trouble concentrating - - -  Moving slowly or fidgety/restless - - -  Suicidal thoughts - - -  PHQ-9 Score - - -  Difficult doing work/chores - - -   GAD 7 : Generalized Anxiety Score 02/28/2018 11/01/2017 05/23/2017  Nervous, Anxious, on Edge 0 0 1  Control/stop worrying 0 0 0  Worry too much - different things 0 1 2  Trouble relaxing 0 1 3  Restless 0 0 3  Easily annoyed or irritable 0 0 0  Afraid - awful might  happen 0 0 1  Total GAD 7 Score 0 2 10  Anxiety Difficulty Not difficult at all Not difficult at all Somewhat difficult    Past Medical History:  Diagnosis Date  . ADHD   . GERD (gastroesophageal reflux disease)   . Wears contact lenses    Past Surgical History:  Procedure Laterality Date  . ESOPHAGOGASTRODUODENOSCOPY (EGD) WITH PROPOFOL N/A 06/14/2017   Procedure: ESOPHAGOGASTRODUODENOSCOPY (EGD) WITH PROPOFOL;  Surgeon: Midge Minium, MD;  Location: [facility];  Service: Endoscopy;  Laterality: N/A;  . SEPTOPLASTY  06/13/2011  . WISDOM TOOTH EXTRACTION     Social History   Socioeconomic History  . Marital status: Married    Spouse name: Not on file  . Number of children: 1  . Years of education: Vocational  . Highest education level: Not on file  Occupational History  . Occupation: IT    Comment: Commuting to [employer]  . Financial resource strain: Not on file  . Food insecurity:    Worry: Not on file    Inability: Not on file  . Transportation needs:    Medical: Not on file    Non-medical: Not on file  Tobacco Use  . Smoking status: Former Smoker    Packs/day: 1.00    Years: 15.00  Pack years: 15.00    Types: Cigarettes    Last attempt to quit: 10/10/2016    Years since quitting: 1.3  . Smokeless tobacco: Former Neurosurgeon  . Tobacco comment: Quit using vaping  Substance and Sexual Activity  . Alcohol use: No  . Drug use: No  . Sexual activity: Not on file  Lifestyle  . Physical activity:    Days per week: Not on file    Minutes per session: Not on file  . Stress: Not on file  Relationships  . Social connections:    Talks on phone: Not on file    Gets together: Not on file    Attends religious service: Not on file    Active member of club or organization: Not on file    Attends meetings of clubs or organizations: Not on file    Relationship status: Not on file  . Intimate partner violence:    Fear of current or ex partner: Not on  file    Emotionally abused: Not on file    Physically abused: Not on file    Forced sexual activity: Not on file  Other Topics Concern  . Not on file  Social History Narrative  . Not on file   Family History  Problem Relation Age of Onset  . Cancer Maternal Grandfather 84       stomach  . Esophageal cancer Maternal Grandfather 33  . Colon cancer Neg Hx   . Prostate cancer Neg Hx   . Diabetes Neg Hx   . Hypertension Neg Hx   . Hypercholesterolemia Neg Hx    Current Outpatient Medications on File Prior to Visit  Medication Sig  . dexlansoprazole (DEXILANT) 60 MG capsule Take 1 capsule (60 mg total) by mouth daily.  Marland Kitchen escitalopram (LEXAPRO) 10 MG tablet Take 1 tablet (10 mg total) by mouth daily.  . fluticasone (FLONASE) 50 MCG/ACT nasal spray Place 2 sprays into both nostrils daily. Use for 4-6 weeks then stop and use seasonally or as needed.   No current facility-administered medications on file prior to visit.     Review of Systems  Constitutional: Negative for activity change, appetite change, chills, diaphoresis, fatigue and fever.  HENT: Negative for congestion and hearing loss.   Eyes: Negative for visual disturbance.  Respiratory: Negative for apnea, cough, choking, chest tightness, shortness of breath and wheezing.   Cardiovascular: Negative for chest pain, palpitations and leg swelling.  Gastrointestinal: Negative for abdominal distention, abdominal pain, anal bleeding, blood in stool, constipation, diarrhea, nausea and vomiting.  Endocrine: Negative for cold intolerance.  Genitourinary: Negative for decreased urine volume, difficulty urinating, dysuria, frequency, hematuria, testicular pain and urgency.  Musculoskeletal: Negative for arthralgias, back pain and neck pain.  Skin: Negative for rash.  Allergic/Immunologic: Positive for environmental allergies.  Neurological: Negative for dizziness, weakness, light-headedness, numbness and headaches.  Hematological:  Negative for adenopathy.  Psychiatric/Behavioral: Negative for behavioral problems, dysphoric mood and sleep disturbance. The patient is nervous/anxious.    Per HPI unless specifically indicated above     Objective:    BP 117/81   Pulse 93   Temp 98.3 F (36.8 C) (Oral)   Resp 16   Ht 5\' 6"  (1.676 m)   Wt 217 lb (98.4 kg)   BMI 35.02 kg/m   Wt Readings from Last 3 Encounters:  02/28/18 217 lb (98.4 kg)  02/28/18 218 lb (98.9 kg)  01/10/18 218 lb (98.9 kg)    Physical Exam  Constitutional: He is oriented  to person, place, and time. He appears well-developed and well-nourished. No distress.  Well-appearing, comfortable, cooperative  HENT:  Head: Normocephalic and atraumatic.  Mouth/Throat: Oropharynx is clear and moist.  Persistent but less frequent vocal tick and throat / sinus clearing noises  Eyes: Conjunctivae are normal. Right eye exhibits no discharge. Left eye exhibits no discharge.  Neck: Normal range of motion.  Cardiovascular: Normal rate, regular rhythm, normal heart sounds and intact distal pulses.  Pulmonary/Chest: Effort normal. No respiratory distress.  Musculoskeletal: He exhibits no edema.  Neurological: He is alert and oriented to person, place, and time.  Skin: Skin is warm and dry. No rash noted. He is not diaphoretic. No erythema.  Psychiatric: He has a normal mood and affect. His behavior is normal.  Well groomed, good eye contact, normal speech and thoughts. Not anxious appearing  Nursing note and vitals reviewed.  Results for orders placed or performed in visit on 02/21/18  Testosterone Total,Free,Bio, Males  Result Value Ref Range   Testosterone 235 (L) 250 - 827 ng/dL   Albumin 4.9 3.6 - 5.1 g/dL   Sex Hormone Binding 15 10 - 50 nmol/L   Testosterone, Free See below 46.0 - 224.0 pg/mL   Testosterone, Bioavailable  110.0 - 575 ng/dL  T4, free  Result Value Ref Range   Free T4 1.1 0.8 - 1.8 ng/dL  TSH  Result Value Ref Range   TSH 1.04 0.40 -  4.50 mIU/L  Lipid panel  Result Value Ref Range   Cholesterol 173 <200 mg/dL   HDL 48 >96 mg/dL   Triglycerides 789 (H) <150 mg/dL   LDL Cholesterol (Calc) 98 mg/dL (calc)   Total CHOL/HDL Ratio 3.6 <5.0 (calc)   Non-HDL Cholesterol (Calc) 125 <130 mg/dL (calc)  COMPLETE METABOLIC PANEL WITH GFR  Result Value Ref Range   Glucose, Bld 83 65 - 99 mg/dL   BUN 14 7 - 25 mg/dL   Creat 3.81 0.17 - 5.10 mg/dL   GFR, Est Non African American 86 > OR = 60 mL/min/1.60m2   GFR, Est African American 99 > OR = 60 mL/min/1.46m2   BUN/Creatinine Ratio NOT APPLICABLE 6 - 22 (calc)   Sodium 140 135 - 146 mmol/L   Potassium 4.2 3.5 - 5.3 mmol/L   Chloride 104 98 - 110 mmol/L   CO2 26 20 - 32 mmol/L   Calcium 9.9 8.6 - 10.3 mg/dL   Total Protein 7.6 6.1 - 8.1 g/dL   Albumin 4.9 3.6 - 5.1 g/dL   Globulin 2.7 1.9 - 3.7 g/dL (calc)   AG Ratio 1.8 1.0 - 2.5 (calc)   Total Bilirubin 0.4 0.2 - 1.2 mg/dL   Alkaline phosphatase (APISO) 55 40 - 115 U/L   AST 18 10 - 40 U/L   ALT 28 9 - 46 U/L  CBC with Differential/Platelet  Result Value Ref Range   WBC 7.3 3.8 - 10.8 Thousand/uL   RBC 5.66 4.20 - 5.80 Million/uL   Hemoglobin 16.2 13.2 - 17.1 g/dL   HCT 25.8 52.7 - 78.2 %   MCV 86.0 80.0 - 100.0 fL   MCH 28.6 27.0 - 33.0 pg   MCHC 33.3 32.0 - 36.0 g/dL   RDW 42.3 53.6 - 14.4 %   Platelets 239 140 - 400 Thousand/uL   MPV 9.8 7.5 - 12.5 fL   Neutro Abs 3,869 1,500 - 7,800 cells/uL   Lymphs Abs 2,489 850 - 3,900 cells/uL   WBC mixed population 664 200 - 950 cells/uL  Eosinophils Absolute 241 15 - 500 cells/uL   Basophils Absolute 37 0 - 200 cells/uL   Neutrophils Relative % 53 %   Total Lymphocyte 34.1 %   Monocytes Relative 9.1 %   Eosinophils Relative 3.3 %   Basophils Relative 0.5 %  Hemoglobin A1c  Result Value Ref Range   Hgb A1c MFr Bld 5.6 <5.7 % of total Hgb   Mean Plasma Glucose 114 (calc)   eAG (mmol/L) 6.3 (calc)      Assessment & Plan:   Problem List Items Addressed This  Visit    Anxiety    Clinically seems improved back on SSRI, see PHQ/GAD score  Plan Continue lexapro 10mg  daily, future consider dosage adjust Continue f/u Psychology Lennice Sites LCSW Follow-up if worsening      Elevated hemoglobin A1c    Mild elevated A1c, but still normal range 5.5 > 5.6  Encourage improve diet, low carb optoins reviewed and handout given Follow-up with yealry lab A1c, return in 6 months may consider add POC A1c if indicated      Obesity (BMI 35.0-39.9 without comorbidity)    Significant weight gain over period of several months Sedentary poor lifestyle, otherwise improving diet lifestyle plans  Likely etiology for slightly low Testosterone given peripheral T>E conversion from adipose / obesity      Seasonal allergies    Stable mostly controlled Continue anti histamine, nasal steroid       Other Visit Diagnoses    Annual physical exam    -  Primary      Updated Health Maintenance information Reviewed recent lab results with patient Encouraged improvement to lifestyle with diet and exercise - Goal of weight loss - future if unable to obtain may refer to Weight Management specialist   No orders of the defined types were placed in this encounter.   Follow up plan: Return in about 6 months (around 08/29/2018) for Weight Check (Possible A1c), Anxiety, f/u specialists.  Saralyn Pilar, DO [facility] 02/28/2018, 10:44 PM

## 2018-02-28 NOTE — Assessment & Plan Note (Addendum)
Significant weight gain over period of several months Sedentary poor lifestyle, otherwise improving diet lifestyle plans  Likely etiology for slightly low Testosterone given peripheral T>E conversion from adipose / obesity

## 2018-02-28 NOTE — Patient Instructions (Addendum)
Thank you for coming to the office today.  Reviewed lab results, recommend reduce carb and starches in diet as discussed. Consider keto diet or part time keto diet, some days only, also work on improving exercise as discussed to see if we can improve weight loss  Testosterone not low enough for treatment, I think this is still within range of normal. Likely due to weight at this time. We can monitor again in future.  A1c 5.6, we will keep trend on this.  For food allergy testing if you want to check this online through a 3rd party that is fine and we can help interpret results if we need consult allergist we can.  Please schedule a Follow-up Appointment to: Return in about 6 months (around 08/29/2018) for Weight Check (Possible A1c), Anxiety, f/u specialists.  If you have any other questions or concerns, please feel free to call the office or send a message through MyChart. You may also schedule an earlier appointment if necessary.  Additionally, you may be receiving a survey about your experience at our office within a few days to 1 week by e-mail or mail. We value your feedback.  Saralyn Pilar, DO [facility], [place]

## 2018-03-07 ENCOUNTER — Ambulatory Visit (INDEPENDENT_AMBULATORY_CARE_PROVIDER_SITE_OTHER): Payer: 59 | Admitting: Psychology

## 2018-03-07 DIAGNOSIS — F4320 Adjustment disorder, unspecified: Secondary | ICD-10-CM

## 2018-03-21 ENCOUNTER — Ambulatory Visit (INDEPENDENT_AMBULATORY_CARE_PROVIDER_SITE_OTHER): Payer: 59 | Admitting: Psychology

## 2018-03-21 DIAGNOSIS — F4320 Adjustment disorder, unspecified: Secondary | ICD-10-CM | POA: Diagnosis not present

## 2018-04-04 ENCOUNTER — Ambulatory Visit (INDEPENDENT_AMBULATORY_CARE_PROVIDER_SITE_OTHER): Payer: 59 | Admitting: Psychology

## 2018-04-04 DIAGNOSIS — F4322 Adjustment disorder with anxiety: Secondary | ICD-10-CM | POA: Diagnosis not present

## 2018-04-15 DIAGNOSIS — R7989 Other specified abnormal findings of blood chemistry: Secondary | ICD-10-CM

## 2018-04-18 ENCOUNTER — Ambulatory Visit (INDEPENDENT_AMBULATORY_CARE_PROVIDER_SITE_OTHER): Payer: 59 | Admitting: Psychology

## 2018-04-18 DIAGNOSIS — F4322 Adjustment disorder with anxiety: Secondary | ICD-10-CM

## 2018-05-02 ENCOUNTER — Other Ambulatory Visit: Payer: Self-pay | Admitting: Family Medicine

## 2018-05-02 DIAGNOSIS — K219 Gastro-esophageal reflux disease without esophagitis: Secondary | ICD-10-CM

## 2018-05-02 DIAGNOSIS — J3089 Other allergic rhinitis: Secondary | ICD-10-CM

## 2018-05-15 ENCOUNTER — Other Ambulatory Visit: Payer: Self-pay

## 2018-05-16 ENCOUNTER — Encounter: Payer: Self-pay | Admitting: Urology

## 2018-05-16 ENCOUNTER — Ambulatory Visit (INDEPENDENT_AMBULATORY_CARE_PROVIDER_SITE_OTHER): Payer: 59 | Admitting: Urology

## 2018-05-16 VITALS — BP 128/83 | HR 77 | Ht 66.0 in | Wt 220.5 lb

## 2018-05-16 DIAGNOSIS — E291 Testicular hypofunction: Secondary | ICD-10-CM | POA: Diagnosis not present

## 2018-05-16 NOTE — Progress Notes (Signed)
05/16/2018 1:08 PM   Robert [name] [birth year] [id number]  Referring provider: Smitty Cords, DO [address] [place], [place] [address]  Chief Complaint  Patient presents with   Hypogonadism    HPI: Robert [name] is a 37 yo M who presents today for the evaluation and management of low testosterone levels reported on 02/21/2018. He was referred to [place] by Smitty Cords, DO.   Pt denies previous history of low testosterone. He checked the testosterone levels with his PCP because he had symptoms of 35-40 ponds weight gain with no change in diet or exercise, loss of muscle mass, and hair thinning around the beard. He reports of being tired and fatigued. He has a decreased libido. Pt denies erectile dysfunction and reports of having a vasectomy. He does have one child.   PMH: Past Medical History:  Diagnosis Date   ADHD    GERD (gastroesophageal reflux disease)    Wears contact lenses     Surgical History: Past Surgical History:  Procedure Laterality Date   ESOPHAGOGASTRODUODENOSCOPY (EGD) WITH PROPOFOL N/A 06/14/2017   Procedure: ESOPHAGOGASTRODUODENOSCOPY (EGD) WITH PROPOFOL;  Surgeon: Midge Minium, MD;  Location: [facility];  Service: Endoscopy;  Laterality: N/A;   SEPTOPLASTY  06/13/2011   WISDOM TOOTH EXTRACTION      Home Medications:  Allergies as of 05/16/2018   No Known Allergies     Medication List        Accurate as of 05/16/18  1:08 PM. Always use your most recent med list.          dexlansoprazole 60 MG capsule Commonly known as:  DEXILANT Take 1 capsule (60 mg total) by mouth daily.   escitalopram 10 MG tablet Commonly known as:  LEXAPRO Take 1 tablet (10 mg total) by mouth daily.   fluticasone 50 MCG/ACT nasal spray Commonly known as:  FLONASE USE 2 SPRAYS IN EACH NOSTRIL DAILY. USE FOR 4 TO 6 WEEKS, THEN STOP AND USE SEASONALLY OR AS NEEDED       Allergies: No Known Allergies  Family History: Family History    Problem Relation Age of Onset   Cancer Maternal Grandfather 43       stomach   Esophageal cancer Maternal Grandfather 64   Colon cancer Neg Hx    Prostate cancer Neg Hx    Diabetes Neg Hx    Hypertension Neg Hx    Hypercholesterolemia Neg Hx     Social History:  reports that he quit smoking about 19 months ago. His smoking use included cigarettes. He has a 15.00 pack-year smoking history. He has quit using smokeless tobacco. He reports that he does not drink alcohol or use drugs.  ROS: UROLOGY Frequent Urination?: No Hard to postpone urination?: No Burning/pain with urination?: No Get up at night to urinate?: No Leakage of urine?: No Urine stream starts and stops?: No Trouble starting stream?: No Do you have to strain to urinate?: No Blood in urine?: No Urinary tract infection?: No Sexually transmitted disease?: No Injury to kidneys or bladder?: No Painful intercourse?: No Weak stream?: No Erection problems?: No Penile pain?: No  Gastrointestinal Nausea?: No Vomiting?: No Indigestion/heartburn?: Yes Diarrhea?: No Constipation?: No  Constitutional Fever: No Night sweats?: No Weight loss?: No Fatigue?: Yes  Skin Skin rash/lesions?: No Itching?: No  Eyes Blurred vision?: No Double vision?: No  Ears/Nose/Throat Sore throat?: No Sinus problems?: Yes  Hematologic/Lymphatic Swollen glands?: No Easy bruising?: No  Cardiovascular Leg swelling?: No Chest pain?: No  Respiratory Cough?: No Shortness of breath?: No  Endocrine Excessive thirst?: No  Musculoskeletal Back pain?: No Joint pain?: No  Neurological Headaches?: No Dizziness?: Yes  Psychologic Depression?: No Anxiety?: No  Physical Exam: BP 128/83 (BP Location: Left Arm, Patient Position: Sitting, Cuff Size: Large)    Pulse 77    Ht 5\' 6"  (1.676 m)    Wt 220 lb 8 oz (100 kg)    BMI 35.59 kg/m   Constitutional: Well nourished. Alert and oriented, No acute distress. HEENT: [place]  AT, moist mucus membranes. Trachea midline, no masses. Cardiovascular: No clubbing, cyanosis, or edema. Respiratory: Normal respiratory effort, no increased work of breathing. GU:  No bladder fullness or masses. Urethral meatus is patent.  No penile discharge. No penile lesions or rashes. Scrotum without lesions, cysts, rashes and/or edema.  Testicles are located scrotally bilaterally. No masses are appreciated in the testicles. Testicular volume estimated 20 cc bilaterally and post vasectomy epididymal changes bilaterally  Skin: No rashes, bruises or suspicious lesions. Neurologic: Grossly intact, no focal deficits, moving all 4 extremities. Psychiatric: Normal mood and affect.  Laboratory Data:  Lab Results  Component Value Date   TESTOSTERONE 235 (L) 02/21/2018   Assessment & Plan:   1. Hypogonadism  -Repeat testosterone level to be drawn today along with an LH and prolactin  -Potential side effects of testosterone replacement were discussed including stimulation of benign prostatic growth with lower urinary tract symptoms; erythrocytosis; edema; gynecomastia; worsening sleep apnea; venous thromboembolism; testicular atrophy and infertility. Recent studies suggesting an increased incidence of heart attack and stroke in patients taking testosterone was discussed. He was informed there is conflicting evidence regarding the impact of testosterone therapy on cardiovascular risk. The theoretical risk of growth stimulation of an undetected prostate cancer was also discussed.  He was informed that current evidence does not provide any definitive answers regarding the risks of testosterone therapy on prostate cancer and cardiovascular disease. The need for periodic monitoring of his testosterone level, PSA, hematocrit and DRE was discussed. - Trial Clomid discussed if LH low normal   [name], MD  [facility] [address] [place], [place]  [address] [phone]  I, Donne Hazel, am acting as a scribe for Dr. Lorin Picket C. Stoioff,  I, [name], MD, have reviewed all documentation for this visit. The documentation on 05/16/18 for the exam, diagnosis, procedures, and orders are all accurate and complete.

## 2018-05-17 ENCOUNTER — Telehealth: Payer: Self-pay | Admitting: Family Medicine

## 2018-05-17 LAB — PROLACTIN: Prolactin: 8.9 ng/mL (ref 4.0–15.2)

## 2018-05-17 LAB — LUTEINIZING HORMONE: LH: 1.6 m[IU]/mL — ABNORMAL LOW (ref 1.7–8.6)

## 2018-05-17 LAB — TESTOSTERONE: Testosterone: 212 ng/dL — ABNORMAL LOW (ref 264–916)

## 2018-05-17 NOTE — Telephone Encounter (Signed)
Patient notified and would like to try Clomid.  

## 2018-05-17 NOTE — Telephone Encounter (Signed)
-----   Message from [name], MD sent at 05/17/2018  1:16 PM EST ----- Prolactin level was normal and LH was slightly decreased.  He would be a candidate for oral Clomid versus testosterone replacement.  If he desires a trial of the oral medication I can send an Rx and would recommend a follow-up visit with testosterone prior in 6 weeks.

## 2018-05-19 MED ORDER — CLOMIPHENE CITRATE 50 MG PO TABS: 25.0000 mg | ORAL_TABLET | Freq: Every day | ORAL | 0 refills | Status: DC

## 2018-05-19 NOTE — Telephone Encounter (Signed)
Rx was sent.  Please schedule 6-week follow-up with lab visit prior

## 2018-05-23 ENCOUNTER — Ambulatory Visit (INDEPENDENT_AMBULATORY_CARE_PROVIDER_SITE_OTHER): Payer: 59 | Admitting: Psychology

## 2018-05-23 DIAGNOSIS — F4320 Adjustment disorder, unspecified: Secondary | ICD-10-CM

## 2018-06-02 ENCOUNTER — Other Ambulatory Visit: Payer: Self-pay | Admitting: Family Medicine

## 2018-06-02 DIAGNOSIS — F419 Anxiety disorder, unspecified: Secondary | ICD-10-CM

## 2018-06-20 ENCOUNTER — Ambulatory Visit (INDEPENDENT_AMBULATORY_CARE_PROVIDER_SITE_OTHER): Payer: 59 | Admitting: Psychology

## 2018-06-20 DIAGNOSIS — F4320 Adjustment disorder, unspecified: Secondary | ICD-10-CM | POA: Diagnosis not present

## 2018-06-26 ENCOUNTER — Telehealth: Payer: Self-pay | Admitting: Urology

## 2018-06-26 NOTE — Telephone Encounter (Signed)
I had to cx his follow up app on the 22nd due you being in the OR, I offered him an app for the 21st and he said that would not work for him, nothing that I offered would work, he said he didn't want to come in when he was going to be able to see his results online. He wants you to review them and just call him with a treatment plan. I told him that I didn't know if that was possible but I would ask you. If is having labs done on 06-28-18   [facility]

## 2018-06-27 ENCOUNTER — Other Ambulatory Visit: Payer: Self-pay | Admitting: Family Medicine

## 2018-06-27 DIAGNOSIS — E291 Testicular hypofunction: Secondary | ICD-10-CM

## 2018-06-28 ENCOUNTER — Other Ambulatory Visit: Payer: 59

## 2018-06-28 DIAGNOSIS — E291 Testicular hypofunction: Secondary | ICD-10-CM | POA: Diagnosis not present

## 2018-06-29 LAB — TESTOSTERONE: TESTOSTERONE: 664 ng/dL (ref 264–916)

## 2018-06-30 NOTE — Telephone Encounter (Signed)
T level was 664 on clomid.  Have his sxs improved?

## 2018-07-01 NOTE — Telephone Encounter (Signed)
Informed patient of T results.  He says that nothing has changed.  They did seem to improve a little but went back down.  He is taking the clomid.  Patient is scheduled for follow up 07/11/2018.

## 2018-07-03 ENCOUNTER — Ambulatory Visit: Payer: 59 | Admitting: Urology

## 2018-07-05 ENCOUNTER — Other Ambulatory Visit: Payer: Self-pay

## 2018-07-05 MED ORDER — DEXLANSOPRAZOLE 60 MG PO CPDR: 60.0000 mg | DELAYED_RELEASE_CAPSULE | Freq: Every day | ORAL | 6 refills | Status: DC

## 2018-07-11 ENCOUNTER — Encounter: Payer: Self-pay | Admitting: Urology

## 2018-07-11 ENCOUNTER — Ambulatory Visit (INDEPENDENT_AMBULATORY_CARE_PROVIDER_SITE_OTHER): Payer: 59 | Admitting: Urology

## 2018-07-11 VITALS — BP 131/76 | HR 109 | Ht 66.0 in | Wt 227.0 lb

## 2018-07-11 DIAGNOSIS — E291 Testicular hypofunction: Secondary | ICD-10-CM

## 2018-07-11 MED ORDER — TESTOSTERONE 20.25 MG/ACT (1.62%) TD GEL: TRANSDERMAL | 2 refills | Status: DC

## 2018-07-11 NOTE — Patient Instructions (Signed)
Testosterone Test Why am I having this test? Testosterone is a hormone made by the adrenal glands in the abdomen in both males and females. In males, it is also made by the testicles. Starting at puberty, testosterone stimulates the development of secondary sex characteristics in males. This includes a deeper voice, muscle and body hair growth, and penis enlargement. In females, testosterone is also produced in the ovaries. The male body converts testosterone into estradiol, the main male sex hormone. An abnormal level of testosterone can cause health issues in both males and females. You may have this test if your health care provider suspects that an abnormal testosterone level is causing or contributing to other health problems. In males, an abnormally low testosterone level can cause:  Inability to have children (infertility).  Trouble getting or maintaining an erection (erectile dysfunction).  Delayed puberty. In females, an abnormally high testosterone level can cause:  Infertility.  Polycystic ovary syndrome (PCOS).  Development of masculine features (virilization). What is being tested? This test measures the amount of total testosterone in your blood. What kind of sample is taken?  A blood sample is required for this test. It is usually collected by inserting a needle into a blood vessel. The sample is most often collected in the morning because that is when testosterone is usually the highest. Tell a health care provider about:  Any allergies you have.  All medicines you are taking, including vitamins, herbs, eye drops, creams, and over-the-counter medicines.  Any blood disorders you have.  Any surgeries you have had.  Any medical conditions you have.  Whether you are pregnant or may be pregnant. How are the results reported? Your test results will be reported as a value that indicates how much testosterone is in your blood. This will be given as nanograms of  testosterone per deciliter of blood (ng/dL). Your health care provider will compare your test results to normal ranges that were established after testing a large group of people (reference ranges). Reference ranges may vary among labs and hospitals. For this test, common reference ranges for total testosterone are:  Male: ? 7 months to 36 years old: less than 30 ng/dL. ? 10-13 years old: less than 300 ng/dL. ? 14-15 years old: 170-540 ng/dL. ? 16-19 years old: 250-910 ng/dL. ? 20 years and older: 280-1,080 ng/dL.  Male: ? 7 months to 36 years old: less than 30 ng/dL. ? 10-13 years old: less than 40 ng/dL. ? 14-15 years old: less than 60 ng/dL. ? 16-19 years old: less than 70 ng/dL. ? 20 years and older: less than 70 ng/dL. What do the results mean? A result that is within your reference range means that you have a normal amount of testosterone in your blood. In males:  A high testosterone level may mean that you: ? Have certain types of tumors. ? Have an overactive thyroid gland (hyperthyroidism). ? Currently use anabolic steroids or used anabolic steroids in the past. ? Have an inherited disorder that affects the adrenal glands (congenital adrenal hyperplasia). ? Are starting puberty early (precocious puberty).  A low testosterone level may mean that you: ? Have certain genetic diseases. ? Have had certain viral infections, such as mumps. ? Have a condition that affects the pituitary gland. ? Have injured your testicles. In females:  A high testosterone level may mean that you have: ? Certain types of tumors, such as ovarian or adrenal gland tumors. ? An inherited disorder that affects certain cells in the adrenal glands (  congenital adrenal hyperplasia). ? Polycystic ovary syndrome.  A low testosterone level usually will not cause health problems. Talk with your health care provider about what your results mean. Questions to ask your health care provider Ask your health  care provider, or the department that is doing the test:  When will my results be ready?  How will I get my results?  What are my treatment options?  What other tests do I need?  What are my next steps? Summary  Testosterone is a hormone made by the adrenal glands in the abdomen in both males and females. In males, it is also made by the testicles. Starting at puberty, testosterone stimulates the development of secondary sex characteristics in males.  In females, testosterone is also produced in the ovaries. The male body converts testosterone into estradiol, the main male sex hormone.  An abnormal level of testosterone can cause health issues in both males and females. You may have this test if your health care provider suspects that an abnormal testosterone level is causing or contributing to other health problems. This information is not intended to replace advice given to you by your health care provider. Make sure you discuss any questions you have with your health care provider. Document Released: 06/15/2004 Document Revised: 02/27/2017 Document Reviewed: 02/27/2017 Elsevier Interactive Patient Education  2019 [employer] Testosterone Replacement Therapy  Testosterone replacement therapy (TRT) is used to treat men who have a low testosterone level (hypogonadism). Testosterone is a male hormone that is produced in the testicles. It is responsible for typically male characteristics and for maintaining a man's sex drive and the ability to get an erection. Testosterone also supports bone and muscle health. TRT can be a gel, liquid, or patch that you put on your skin. It can also be in the form of a tablet or an injection. In some cases, your health care provider may insert long-acting pellets under your skin. In most men, the level of testosterone starts to decline gradually after age 52. Low testosterone can also be caused by certain medical conditions, medicines, and obesity. Your  health care provider can diagnose hypogonadism with at least two blood tests that are done early in the morning. Low testosterone may not need to be treated. TRT is usually a choice that you make with your health care provider. Your health care provider may recommend TRT if you have low testosterone that is causing symptoms, such as:  Low sex drive.  Erection problems.  Breast enlargement.  Loss of body hair.  Weak muscles or bones.  Shrinking testicles.  Increased body fat.  Low energy.  Hot flashes.  Depression.  Decreased work International aid/development worker. TRT is a lifetime treatment. If you stop treatment, your testosterone will drop, and your symptoms may return. What are the risks? Testosterone replacement therapy may have side effects, including:  Lower sperm count.  Skin irritation at the application or injection site.  Mouth irritation if you take an oral tablet.  Acne.  Swelling of your legs or feet.  Tender breasts.  Dizziness.  Sleep disturbance.  Mood swings.  Possible increased risk of stroke or heart attack. Testosterone replacement therapy may also increase your risk for prostate cancer or male breast cancer. You should not use TRT if you have either of those conditions. Your health care provider also may not recommend TRT if:  You are suspected of having prostate cancer.  You want to father a child.  You have a high number of red blood cells.  You have untreated sleep apnea.  You have a very large prostate. Supplies needed:  Your health care provider will prescribe the testosterone gel, solution, or medicine that you need. If your health care provider teaches you to do self-injections at home, you will also need: ? Your medicine vial. ? Disposable needles and syringes. ? Alcohol swabs. ? A needle disposal container. ? Adhesive bandages. How to use testosterone replacement therapy Your health care provider will help you find the TRT option that will  work best for you based on your preference, the side effects, and the cost. You may:  Rub testosterone gel on your upper arm or shoulder every day after a shower. This is the most common type of TRT. Do not let women or children come in contact with the gel.  Apply a testosterone solution under your arms once each day.  Place a testosterone patch on your skin once each day.  Dissolve a testosterone tablet in your mouth twice each day.  Have a testosterone pellet inserted under your skin by your health care provider. This will be replaced every 3-6 months.  Use testosterone nasal spray three times each day.  Get testosterone injections. For some types of testosterone, your health care provider will give you this injection. With other types of testosterone, you may be taught to give injections to yourself. The frequency of injections may vary based on the type of testosterone that you receive. Follow these instructions at home:  Take over-the-counter and prescription medicines only as told by your health care provider.  Lose weight if you are overweight. Ask your health care provider to help you start a healthy diet and exercise program to reach and maintain a healthy weight.  Work with your health care provider to treat other medical conditions that may lower your testosterone. These include obesity, high blood pressure, high cholesterol, diabetes, liver disease, kidney disease, and sleep apnea.  Keep all follow-up visits as told by your health care provider. This is important. General recommendations  Discuss all risks and benefits with your health care provider before starting therapy.  Work with your health care provider to check your prostate health and do blood testing before you start therapy.  Do not use any testosterone replacement therapies that are not prescribed by your health care provider or not approved for use in the [place]  Do not use TRT for bodybuilding or to improve  sexual performance. TRT should be used only to treat symptoms of low testosterone.  Return for all repeat prostate checks and blood tests during therapy, as told by your health care provider. Where to find more information Learn more about testosterone replacement therapy from:  American U[employer] Foundation: www.urologyhealth.org/urologic-conditions/low-testosterone-(hypogonadism)  Endocrine Society: www.hormone.org/diseases-and-conditions/mens-health/hypogonadism Contact a health care provider if:  You have side effects from your testosterone replacement therapy.  You continue to have symptoms of low testosterone during treatment.  You develop new symptoms during treatment. Summary  Testosterone replacement therapy is only for men who have low testosterone as determined by blood testing and who have symptoms of low testosterone.  Testosterone replacement therapy should be prescribed only by a health care provider and should be used under the supervision of a health care provider.  You may not be able to take testosterone if you have certain medical conditions, including prostate cancer, male breast cancer, or heart disease.  Testosterone replacement therapy may have side effects and may make some medical conditions worse.  Talk with your health care provider about all  the risks and benefits before you start therapy. This information is not intended to replace advice given to you by your health care provider. Make sure you discuss any questions you have with your health care provider. Document Released: 02/17/2016 Document Revised: 02/17/2016 Document Reviewed: 02/17/2016 Elsevier Interactive Patient Education  2019 [employer]

## 2018-07-11 NOTE — Progress Notes (Signed)
07/11/2018  10:19 AM   Robert [name] [birth year] [id number]  Referring provider: Smitty Cords, DO [address] [place], [place] [address]  Chief Complaint  Patient presents with  . Hypogonadism   Urologic History: 1. Hypogonadism  - PCP checked testosterone levels due to 35-40lbs unexpected weight gain and peripheral beard thinning. Patient presented with c/o fatigue and decreased libido.  - Patient has had a vasectomy.  - Last labs: testosterone 664 (06/2018), LH 1.6 (05/2018), and prolactin 8.9 (05/2018)  - Clomid trial started (25 mg pid) on 05/16/18.  HPI: Robert [name] is a 38 y.o. White or Caucasian male with hypogonadism that presents today for evaluation and management.  - Patient reports no real symptom improvement on Clomid and is interested in other options  -Testosterone level on Clomid improved to 664  PMH: Past Medical History:  Diagnosis Date  . ADHD   . GERD (gastroesophageal reflux disease)   . Wears contact lenses     Surgical History: Past Surgical History:  Procedure Laterality Date  . ESOPHAGOGASTRODUODENOSCOPY (EGD) WITH PROPOFOL N/A 06/14/2017   Procedure: ESOPHAGOGASTRODUODENOSCOPY (EGD) WITH PROPOFOL;  Surgeon: Midge Minium, MD;  Location: [facility];  Service: Endoscopy;  Laterality: N/A;  . SEPTOPLASTY  06/13/2011  . WISDOM TOOTH EXTRACTION      Home Medications:  Allergies as of 07/11/2018   No Known Allergies     Medication List       Accurate as of July 11, 2018 10:19 AM. Always use your most recent med list.        clomiPHENE 50 MG tablet Commonly known as:  CLOMID Take 0.5 tablets (25 mg total) by mouth daily.   dexlansoprazole 60 MG capsule Commonly known as:  DEXILANT Take 1 capsule (60 mg total) by mouth daily. **PLEASE SCHEDULE FOLLOW UP APPT**   escitalopram 10 MG tablet Commonly known as:  LEXAPRO TAKE 1 TABLET(10 MG) BY MOUTH DAILY   fluticasone 50 MCG/ACT nasal spray Commonly known as:   FLONASE USE 2 SPRAYS IN EACH NOSTRIL DAILY. USE FOR 4 TO 6 WEEKS, THEN STOP AND USE SEASONALLY OR AS NEEDED       Allergies: No Known Allergies  Family History: Family History  Problem Relation Age of Onset  . Cancer Maternal Grandfather 84       stomach  . Esophageal cancer Maternal Grandfather 77  . Colon cancer Neg Hx   . Prostate cancer Neg Hx   . Diabetes Neg Hx   . Hypertension Neg Hx   . Hypercholesterolemia Neg Hx     Social History:  reports that he quit smoking about 21 months ago. His smoking use included cigarettes. He has a 15.00 pack-year smoking history. He has quit using smokeless tobacco. He reports that he does not drink alcohol or use drugs.  ROS: UROLOGY Frequent Urination?: No Hard to postpone urination?: No Burning/pain with urination?: No Get up at night to urinate?: No Leakage of urine?: No Urine stream starts and stops?: No Trouble starting stream?: No Do you have to strain to urinate?: No Blood in urine?: No Urinary tract infection?: No Sexually transmitted disease?: No Injury to kidneys or bladder?: No Painful intercourse?: No Weak stream?: No Erection problems?: No Penile pain?: No  Gastrointestinal Nausea?: No Vomiting?: No Indigestion/heartburn?: No Diarrhea?: No Constipation?: No  Constitutional Fever: No Night sweats?: No Weight loss?: No Fatigue?: No  Skin Skin rash/lesions?: No Itching?: No  Eyes Blurred vision?: No Double vision?: No  Ears/Nose/Throat Sore throat?: No Sinus  problems?: No  Hematologic/Lymphatic Swollen glands?: No Easy bruising?: No  Cardiovascular Leg swelling?: No Chest pain?: No  Respiratory Cough?: No Shortness of breath?: No  Endocrine Excessive thirst?: No  Musculoskeletal Back pain?: No Joint pain?: No  Neurological Headaches?: No Dizziness?: No  Psychologic Depression?: No Anxiety?: No  Physical Exam: BP 131/76 (BP Location: Left Arm, Patient Position: Sitting,  Cuff Size: Large)   Pulse (!) 109   Ht 5\' 6"  (1.676 m)   Wt 227 lb (103 kg)   BMI 36.64 kg/m   Constitutional:  Alert and oriented, No acute distress. Respiratory: Normal respiratory effort, no increased work of breathing. Head: Normocephalic and Atraumatic. GU: No CVA tenderness Skin: No rashes, bruises or suspicious lesions. Neurologic: Grossly intact, no focal deficits, moving all 4 extremities. Psychiatric: Normal mood and affect.  Laboratory Data: Appointment on 06/28/2018  Component Date Value Ref Range Status  . Testosterone 06/28/2018 664  264 - 916 ng/dL Final   Comment: Adult male reference interval is based on a population of healthy nonobese males (BMI <30) between 28 and 53 years old. Travison, et.al. JCEM [phone]. PMID: [id number].   Office Visit on 05/16/2018  Component Date Value Ref Range Status  . Prolactin 05/16/2018 8.9  4.0 - 15.2 ng/mL Final  . Testosterone 05/16/2018 212* 264 - 916 ng/dL Final   Comment: Adult male reference interval is based on a population of healthy nonobese males (BMI <30) between 22 and 52 years old. Travison, et.al. JCEM [phone]. PMID: [id number].   . LH 05/16/2018 1.6* 1.7 - 8.6 mIU/mL Final   Assessment & Plan:   1. Hypogonadism in male  - Patient reports no symptom improvement on Clomid trial, although, testosterone levels have improved.  - Discussed that the next treatment path would be testosterone therapy ( topicals, IM injections, and subcutaneous injections)  - Patient is interested in topicals. I recommend testosterone gel 1.62% qD  Return in about 6 weeks (around 08/22/2018) for f/u with testosterone level prior.  [name], MD [facility] [address] [place], [place] [address] [phone]  I, [phone] Melany Guernsey , am acting as a scribe for [name], MD  I, [name], MD, have reviewed all documentation for this visit. The  documentation on 07/11/18 for the exam, diagnosis, procedures, and orders are all accurate and complete.

## 2018-07-12 ENCOUNTER — Telehealth: Payer: Self-pay

## 2018-07-12 NOTE — Telephone Encounter (Signed)
Prior auth for Testosterone done via covermymeds.

## 2018-07-12 NOTE — Telephone Encounter (Signed)
Testosterone gel was denied by [employer].  They cover the patch and injection.  They say we can prescribe testim but it would also need a prior auth.  So please advise alternative.

## 2018-07-15 NOTE — Telephone Encounter (Signed)
Patient notified and is willing to do the injections. He will call and schedule injection training when he has the medication in hand. Please send RX.

## 2018-07-15 NOTE — Telephone Encounter (Signed)
I would recommend injection over patches.  If he desires to proceed will send in an Rx and please make an appointment for injections or injection training.

## 2018-07-16 MED ORDER — TESTOSTERONE CYPIONATE 200 MG/ML IM SOLN: 200.0000 mg | INTRAMUSCULAR | 3 refills | Status: DC

## 2018-07-16 NOTE — Addendum Note (Signed)
Addended by: [name] on: 07/16/2018 09:23 AM   Modules accepted: Orders

## 2018-07-16 NOTE — Telephone Encounter (Signed)
Rx was sent  

## 2018-07-18 NOTE — Progress Notes (Signed)
07/19/2018  9:45 AM   Robert [name] 12-19-1980 [id number]  Referring provider: Smitty Cords, DO [address] [place], [place] [address]  Chief Complaint  Patient presents with   Hypogonadism    injection training    HPI: Robert [name] is a 38 y.o. male Caucasian who presents today for testosterone injection instruction.  Patient had showed no real symptom improvement on Clomid, though his testosterone level improved to 664, and has opted to pursue testosterone therapy.  Patient had questions about what he ought to be expecting in the way of results from the injections and the timeframe.  He had no questions about how to do injections, saying that YouTube had proven useful.  He is hoping that his stress tolerance and anxiety symptoms will improve, and was told that usually improved around the second month.  PMH: Past Medical History:  Diagnosis Date   ADHD    GERD (gastroesophageal reflux disease)    Wears contact lenses     Surgical History: Past Surgical History:  Procedure Laterality Date   ESOPHAGOGASTRODUODENOSCOPY (EGD) WITH PROPOFOL N/A 06/14/2017   Procedure: ESOPHAGOGASTRODUODENOSCOPY (EGD) WITH PROPOFOL;  Surgeon: Midge Minium, MD;  Location: [facility];  Service: Endoscopy;  Laterality: N/A;   SEPTOPLASTY  06/13/2011   WISDOM TOOTH EXTRACTION      Home Medications:  Allergies as of 07/19/2018   No Known Allergies     Medication List       Accurate as of July 19, 2018  9:45 AM. Always use your most recent med list.        dexlansoprazole 60 MG capsule Commonly known as:  DEXILANT Take 1 capsule (60 mg total) by mouth daily. **PLEASE SCHEDULE FOLLOW UP APPT**   escitalopram 10 MG tablet Commonly known as:  LEXAPRO TAKE 1 TABLET(10 MG) BY MOUTH DAILY   fluticasone 50 MCG/ACT nasal spray Commonly known as:  FLONASE USE 2 SPRAYS IN EACH NOSTRIL DAILY. USE FOR 4 TO 6 WEEKS, THEN STOP AND USE SEASONALLY OR AS NEEDED     testosterone cypionate 200 MG/ML injection Commonly known as:  DEPOTESTOSTERONE CYPIONATE Inject 1 mL (200 mg total) into the muscle every 14 (fourteen) days.       Allergies: No Known Allergies  Family History: Family History  Problem Relation Age of Onset   Cancer Maternal Grandfather 60       stomach   Esophageal cancer Maternal Grandfather 42   Colon cancer Neg Hx    Prostate cancer Neg Hx    Diabetes Neg Hx    Hypertension Neg Hx    Hypercholesterolemia Neg Hx     Social History:  reports that he quit smoking about 21 months ago. His smoking use included cigarettes. He has a 15.00 pack-year smoking history. He has quit using smokeless tobacco. He reports that he does not drink alcohol or use drugs.  ROS: UROLOGY Frequent Urination?: No Hard to postpone urination?: No Burning/pain with urination?: No Get up at night to urinate?: No Leakage of urine?: No Urine stream starts and stops?: No Trouble starting stream?: No Do you have to strain to urinate?: No Blood in urine?: No Urinary tract infection?: No Sexually transmitted disease?: No Injury to kidneys or bladder?: No Painful intercourse?: No Weak stream?: No Erection problems?: No Penile pain?: No  Gastrointestinal Nausea?: No Vomiting?: No Indigestion/heartburn?: No Diarrhea?: No Constipation?: No  Constitutional Fever: No Night sweats?: No Weight loss?: No Fatigue?: No  Skin Skin rash/lesions?: No Itching?: No  Eyes Blurred vision?: No Double vision?: No  Ears/Nose/Throat Sore throat?: No Sinus problems?: No  Hematologic/Lymphatic Swollen glands?: No Easy bruising?: No  Cardiovascular Leg swelling?: No Chest pain?: No  Respiratory Cough?: No Shortness of breath?: No  Endocrine Excessive thirst?: No  Musculoskeletal Back pain?: No Joint pain?: No  Neurological Headaches?: No Dizziness?: No  Psychologic Depression?: No Anxiety?: No  Physical Exam: BP (!)  143/85 (BP Location: Left Arm, Patient Position: Sitting)    Pulse 87    Ht 5\' 6"  (1.676 m)    Wt 221 lb (100.2 kg)    BMI 35.67 kg/m   Constitutional:  Well nourished. Alert and oriented, No acute distress. Cardiovascular: No clubbing, cyanosis, or edema. Respiratory: Normal respiratory effort, no increased work of breathing. Skin: No rashes, bruises or suspicious lesions. Neurologic: Grossly intact, no focal deficits, moving all 4 extremities. Psychiatric: Normal mood and affect.  Laboratory Data: Lab Results  Component Value Date   WBC 7.3 02/21/2018   HGB 16.2 02/21/2018   HCT 48.7 02/21/2018   MCV 86.0 02/21/2018   PLT 239 02/21/2018   Lab Results  Component Value Date   CREATININE 1.11 02/21/2018   Lab Results  Component Value Date   TESTOSTERONE 664 06/28/2018    Lab Results  Component Value Date   HGBA1C 5.6 02/21/2018    Lab Results  Component Value Date   TSH 1.04 02/21/2018      Component Value Date/Time   CHOL 173 02/21/2018 0908   HDL 48 02/21/2018 0908   CHOLHDL 3.6 02/21/2018 0908   LDLCALC 98 02/21/2018 0908   Lab Results  Component Value Date   AST 18 02/21/2018   Lab Results  Component Value Date   ALT 28 02/21/2018   I have reviewed the labs.  Assessment & Plan:  1. Hypogonadism in male - Patient started testosterone injection therapy today - Explained to patient the general time frame for symptom improvements, both in general and for the shots specifically, and what to expect - Discussed with patient the time frame for follow up - Discussed with patient what symptoms to note for follow up  Return for return on 03/27 for testosterone level and then schedule follow up with Dr. Lonna Cobb .  These notes generated with voice recognition software. I apologize for typographical errors.  Michiel Cowboy, PA-C  [facility] [address]  [address] [place], [place] [address] [phone]  I, Robert [name],  am acting as a Neurosurgeon for [employer], PA-C.   I have reviewed the above documentation for accuracy and completeness, and I agree with the above.    Michiel Cowboy, PA-C

## 2018-07-19 ENCOUNTER — Encounter: Payer: Self-pay | Admitting: Urology

## 2018-07-19 ENCOUNTER — Ambulatory Visit (INDEPENDENT_AMBULATORY_CARE_PROVIDER_SITE_OTHER): Payer: 59 | Admitting: Urology

## 2018-07-19 VITALS — BP 143/85 | HR 87 | Ht 66.0 in | Wt 221.0 lb

## 2018-07-19 DIAGNOSIS — E291 Testicular hypofunction: Secondary | ICD-10-CM

## 2018-07-19 NOTE — Patient Instructions (Signed)
Testosterone injection What is this medicine? TESTOSTERONE (tes TOS ter one) is the main male hormone. It supports normal male development such as muscle growth, facial hair, and deep voice. It is used in males to treat low testosterone levels. This medicine may be used for other purposes; ask your health care provider or pharmacist if you have questions. COMMON BRAND NAME(S): Andro-L.A., Aveed, Delatestryl, Depo-Testosterone, Virilon What should I tell my health care provider before I take this medicine? They need to know if you have any of these conditions: -cancer -diabetes -heart disease -kidney disease -liver disease -lung disease -prostate disease -an unusual or allergic reaction to testosterone, other medicines, foods, dyes, or preservatives -pregnant or trying to get pregnant -breast-feeding How should I use this medicine? This medicine is for injection into a muscle. It is usually given by a health care professional in a hospital or clinic setting. Contact your pediatrician regarding the use of this medicine in children. While this medicine may be prescribed for children as young as 12 years of age for selected conditions, precautions do apply. Overdosage: If you think you have taken too much of this medicine contact a poison control center or emergency room at once. NOTE: This medicine is only for you. Do not share this medicine with others. What if I miss a dose? Try not to miss a dose. Your doctor or health care professional will tell you when your next injection is due. Notify the office if you are unable to keep an appointment. What may interact with this medicine? -medicines for diabetes -medicines that treat or prevent blood clots like warfarin -oxyphenbutazone -propranolol -steroid medicines like prednisone or cortisone This list may not describe all possible interactions. Give your health care provider a list of all the medicines, herbs, non-prescription drugs, or  dietary supplements you use. Also tell them if you smoke, drink alcohol, or use illegal drugs. Some items may interact with your medicine. What should I watch for while using this medicine? Visit your doctor or health care professional for regular checks on your progress. They will need to check the level of testosterone in your blood. This medicine is only approved for use in men who have low levels of testosterone related to certain medical conditions. Heart attacks and strokes have been reported with the use of this medicine. Notify your doctor or health care professional and seek emergency treatment if you develop breathing problems; changes in vision; confusion; chest pain or chest tightness; sudden arm pain; severe, sudden headache; trouble speaking or understanding; sudden numbness or weakness of the face, arm or leg; loss of balance or coordination. Talk to your doctor about the risks and benefits of this medicine. This medicine may affect blood sugar levels. If you have diabetes, check with your doctor or health care professional before you change your diet or the dose of your diabetic medicine. Testosterone injections are not commonly used in women. Women should inform their doctor if they wish to become pregnant or think they might be pregnant. There is a potential for serious side effects to an unborn child. Talk to your health care professional or pharmacist for more information. Talk with your doctor or health care professional about your birth control options while taking this medicine. This drug is banned from use in athletes by most athletic organizations. What side effects may I notice from receiving this medicine? Side effects that you should report to your doctor or health care professional as soon as possible: -allergic reactions like skin rash,   itching or hives, swelling of the face, lips, or tongue -breast enlargement -breathing problems -changes in emotions or moods -deep or  hoarse voice -irregular menstrual periods -signs and symptoms of liver injury like dark yellow or brown urine; general ill feeling or flu-like symptoms; light-colored stools; loss of appetite; nausea; right upper belly pain; unusually weak or tired; yellowing of the eyes or skin -stomach pain -swelling of the ankles, feet, hands -too frequent or persistent erections -trouble passing urine or change in the amount of urine Side effects that usually do not require medical attention (report to your doctor or health care professional if they continue or are bothersome): -acne -change in sex drive or performance -facial hair growth -hair loss -headache This list may not describe all possible side effects. Call your doctor for medical advice about side effects. You may report side effects to FDA at 1-800-FDA-1088. Where should I keep my medicine? Keep out of the reach of children. This medicine can be abused. Keep your medicine in a safe place to protect it from theft. Do not share this medicine with anyone. Selling or giving away this medicine is dangerous and against the law. Store at room temperature between 20 and 25 degrees C (68 and 77 degrees F). Do not freeze. Protect from light. Follow the directions for the product you are prescribed. Throw away any unused medicine after the expiration date. NOTE: This sheet is a summary. It may not cover all possible information. If you have questions about this medicine, talk to your doctor, pharmacist, or health care provider.  2019 Elsevier/Gold Standard (2015-07-03 07:33:55)      Instructions for disposing of "Sharps" Disposal of syringes and other sharp objects is monitored by the Dietitian (EPA). It is important to dispose of them properly for your safety and for the safety of others.  The EPA promotes all recycling activities, and therefore encourages you to discard medical waste sharps in sturdy, non-recyclable containers,  when possible.  Your stat or community environmental programs may have other requirements or suggestions for disposing of your medical waste.  You should contact your local EPA office for any information you may need.  What container should be used Place needles, syringes, lancets and other sharp objects in a hard plastic or metal container with a screw on or tightly secured lid.  Many containers found in the household will do, or you may purchase containers specifically designed for disposal of medical wast sharps.  If a recyclable container is used to dispose of medical waste sharps, make sure that you don't mix the container with other materials to be recycled.  Since the sharps impair a containers recyclability, a container holding your medical waste sharps properly belongs with the regular household trash.  You should label the container "Not for Recycling".  In addition, make sure your sharps container is made of non breakable material and has a lid that can be securely closed (screwed on or tightly secured).  Before discarding a container, be sure to reinforce the lid with heavy-duty tape.  Do not put sharpe objects in a container you plan to recycle or return to a store, and do not use glass or clear plastic containers (see additional information below).  Finally, make sure that you keep all containers with sharp objects out of the reach of children and pets.  Your home care provider may deliver a sharps container with your medical supplies.  If so place all needles, syringes and lancets in this container  and notify the company when the container is approximately 75% full.  Your home care provider will arrange for pickup of the container.  For your safety, do NOT bring your container to the hospital or [facility] for disposal.        Tips for minimizing injection pain- -inject medicine that is at room temperature -remove all air bubbles from the syringe before injection -wait  until the topical alcohol has evaporated before injecting -keep muscles in the injection area relaxed -break through the skin quickly -don't change the direction of the needle as it goes in or comes out -do not reuse disposable needles

## 2018-07-19 NOTE — Progress Notes (Signed)
IM Injection  Patient is present today for an IM Injection training for treatment of hypogonadism. Drug: Testosterone Cypionate  Dose:200mg /ml Location:right thigh Lot: [id number] Exp:06/21 Patient verbalized understanding of injection instructions and drew up his testosterone injection himself with assistance. Cleaned and prepped the site with an alcohol pad. Patient administered 36ml into right vastus lateralis. Patient tolerated well, no complications were noted. Four (4) Syringes and 18G and 21G needles were supplied to patient.  Preformed by: Delena Bali, CMA

## 2018-07-24 DIAGNOSIS — R221 Localized swelling, mass and lump, neck: Secondary | ICD-10-CM | POA: Diagnosis not present

## 2018-07-24 DIAGNOSIS — R1310 Dysphagia, unspecified: Secondary | ICD-10-CM | POA: Diagnosis not present

## 2018-08-15 ENCOUNTER — Ambulatory Visit (INDEPENDENT_AMBULATORY_CARE_PROVIDER_SITE_OTHER): Payer: 59 | Admitting: Psychology

## 2018-08-15 DIAGNOSIS — F4320 Adjustment disorder, unspecified: Secondary | ICD-10-CM | POA: Diagnosis not present

## 2018-08-16 ENCOUNTER — Other Ambulatory Visit: Payer: 59

## 2018-08-23 ENCOUNTER — Ambulatory Visit: Payer: 59 | Admitting: Urology

## 2018-09-05 ENCOUNTER — Ambulatory Visit (INDEPENDENT_AMBULATORY_CARE_PROVIDER_SITE_OTHER): Payer: 59 | Admitting: Psychology

## 2018-09-05 DIAGNOSIS — F4320 Adjustment disorder, unspecified: Secondary | ICD-10-CM

## 2018-09-06 ENCOUNTER — Other Ambulatory Visit: Payer: Self-pay

## 2018-09-06 ENCOUNTER — Other Ambulatory Visit (INDEPENDENT_AMBULATORY_CARE_PROVIDER_SITE_OTHER): Payer: 59

## 2018-09-06 DIAGNOSIS — E291 Testicular hypofunction: Secondary | ICD-10-CM

## 2018-09-06 NOTE — Progress Notes (Signed)
Blood drawn.

## 2018-09-07 LAB — TESTOSTERONE: Testosterone: 601 ng/dL (ref 264–916)

## 2018-09-11 ENCOUNTER — Other Ambulatory Visit: Payer: Self-pay | Admitting: Urology

## 2018-09-11 MED ORDER — "SYRINGE/NEEDLE (DISP) 23G X 1-1/2"" 3 ML MISC": 0 refills | Status: DC

## 2018-09-11 MED ORDER — "SYRINGE/NEEDLE (DISP) 18G X 1-1/2"" 3 ML MISC": 0 refills | Status: DC

## 2018-09-11 NOTE — Telephone Encounter (Signed)
Pt called and needs refill for needles for testosterone injections.

## 2018-09-11 NOTE — Addendum Note (Signed)
Addended by: Milas Kocher A on: 09/11/2018 03:11 PM   Modules accepted: Orders

## 2018-09-11 NOTE — Telephone Encounter (Addendum)
Rx sent in to pharmacy-patient aware

## 2018-09-13 ENCOUNTER — Other Ambulatory Visit: Payer: Self-pay

## 2018-09-13 ENCOUNTER — Ambulatory Visit (INDEPENDENT_AMBULATORY_CARE_PROVIDER_SITE_OTHER): Payer: 59 | Admitting: Psychology

## 2018-09-13 DIAGNOSIS — E291 Testicular hypofunction: Secondary | ICD-10-CM

## 2018-09-13 DIAGNOSIS — F4320 Adjustment disorder, unspecified: Secondary | ICD-10-CM

## 2018-09-13 MED ORDER — "NEEDLE (DISP) 25G X 1-1/2"" MISC": 0 refills | Status: DC

## 2018-09-13 MED ORDER — "NEEDLE (DISP) 18G X 1-1/2"" MISC": 0 refills | Status: DC

## 2018-09-13 MED ORDER — SYRINGE 2-3 ML 3 ML MISC: 0 refills | Status: DC

## 2018-09-13 NOTE — Telephone Encounter (Signed)
Pt calls and states that the wrong syringes were sent into the pharmacy for him. Upon review of charge the needles that were sent in were attached to the syringe therefore pt is unable to change needle for injection. Will have corrected rx sent in.

## 2018-09-16 ENCOUNTER — Telehealth: Payer: Self-pay | Admitting: Urology

## 2018-09-16 DIAGNOSIS — E291 Testicular hypofunction: Secondary | ICD-10-CM

## 2018-09-16 MED ORDER — "NEEDLE (DISP) 21G X 1-1/2"" MISC": 0 refills | Status: AC

## 2018-09-16 MED ORDER — SYRINGE 2-3 ML 3 ML MISC: 0 refills | Status: AC

## 2018-09-16 MED ORDER — "NEEDLE (DISP) 18G X 1-1/2"" MISC": 0 refills | Status: AC

## 2018-09-16 NOTE — Telephone Encounter (Signed)
62ml syringes, 21G needles and 18g needles sent to pharmacy

## 2018-09-16 NOTE — Telephone Encounter (Signed)
Pt called and states that his syringes that were called into [facility] are incorrect. He would like a call back please.

## 2018-09-16 NOTE — Addendum Note (Signed)
Addended by: Anne Fu on: 09/16/2018 04:38 PM   Modules accepted: Orders

## 2018-09-19 ENCOUNTER — Other Ambulatory Visit: Payer: Self-pay

## 2018-09-19 ENCOUNTER — Telehealth (INDEPENDENT_AMBULATORY_CARE_PROVIDER_SITE_OTHER): Payer: 59 | Admitting: Urology

## 2018-09-19 DIAGNOSIS — E291 Testicular hypofunction: Secondary | ICD-10-CM | POA: Diagnosis not present

## 2018-09-19 NOTE — Progress Notes (Signed)
Virtual Visit via Video Note  I connected with Robert [name] on 09/19/18 at 1:25 PM EDT by a video enabled telemedicine application and verified that I am speaking with the correct person using two identifiers.   I discussed the limitations of evaluation and management by telemedicine and the availability of in person appointments. The patient expressed understanding and agreed to proceed.  History of Present Illness: 38 year old male seen 05/16/2018 for hypogonadism with symptoms of decreased libido, tiredness, fatigue, increased weight gain and decreased muscle mass.  Testosterone level was in the low 200 range.  His LH was low normal.  He initially elected a trial of Clomid and had significant improvement in his testosterone level to 664 however did not have symptomatic improvement.  He started testosterone cypionate injections and February 2020.  He has noted significant increase libido and energy level.  He denies breast tenderness/enlargement, lower extremity edema or bothersome lower urinary tract symptoms.  A midcycle testosterone level drawn on 09/06/2018 was 601.   Observations/Objective: Alert, in no acute distress  Assessment and Plan: 38 year old male with hypogonadism doing well on TRT.  Follow-up in August 2020 for office visit and monitoring blood work to include PSA, hematocrit and testosterone.  We also discussed recent approval of Jaten[employer].  Follow Up Instructions: Follow-up August 2020   I discussed the assessment and treatment plan with the patient. The patient was provided an opportunity to ask questions and all were answered. The patient agreed with the plan and demonstrated an understanding of the instructions.   The patient was advised to call back or seek an in-person evaluation if the symptoms worsen or if the condition fails to improve as anticipated.  I provided 10 minutes of non-face-to-face time during this encounter.   [name], MD

## 2018-09-20 ENCOUNTER — Ambulatory Visit: Payer: 59 | Admitting: Urology

## 2018-09-30 ENCOUNTER — Other Ambulatory Visit: Payer: Self-pay | Admitting: Family Medicine

## 2018-09-30 DIAGNOSIS — F419 Anxiety disorder, unspecified: Secondary | ICD-10-CM

## 2018-10-03 ENCOUNTER — Ambulatory Visit (INDEPENDENT_AMBULATORY_CARE_PROVIDER_SITE_OTHER): Payer: 59 | Admitting: Psychology

## 2018-10-03 DIAGNOSIS — F4322 Adjustment disorder with anxiety: Secondary | ICD-10-CM

## 2018-10-08 ENCOUNTER — Other Ambulatory Visit: Payer: Self-pay

## 2018-10-08 ENCOUNTER — Telehealth: Payer: Self-pay | Admitting: Gastroenterology

## 2018-10-08 MED ORDER — DEXLANSOPRAZOLE 60 MG PO CPDR: 60.0000 mg | DELAYED_RELEASE_CAPSULE | Freq: Every day | ORAL | 1 refills | Status: DC

## 2018-10-08 NOTE — Telephone Encounter (Signed)
Rx for Dexilant 60mg  resent to pt's pharmacy as a 90 day supply.

## 2018-10-08 NOTE — Telephone Encounter (Signed)
Patient called in & would like to change medication  dexlansoprazole (DEXILANT) 60 MG capsule    from 60 day supply to a 90 day supply. Please call in to [facility] in [place]. He has enough for now just wanted it to be ready for his next refill. It's the same price with his insurance.

## 2018-10-17 ENCOUNTER — Ambulatory Visit (INDEPENDENT_AMBULATORY_CARE_PROVIDER_SITE_OTHER): Payer: 59 | Admitting: Psychology

## 2018-10-17 DIAGNOSIS — F4322 Adjustment disorder with anxiety: Secondary | ICD-10-CM

## 2018-10-31 ENCOUNTER — Ambulatory Visit: Payer: 59 | Admitting: Psychology

## 2018-11-06 ENCOUNTER — Ambulatory Visit (INDEPENDENT_AMBULATORY_CARE_PROVIDER_SITE_OTHER): Payer: 59 | Admitting: Psychology

## 2018-11-06 DIAGNOSIS — F4320 Adjustment disorder, unspecified: Secondary | ICD-10-CM | POA: Diagnosis not present

## 2018-11-15 IMAGING — CR DG SINUSES COMPLETE 3+V
1 series · 4 of 4 positions shown · non-contrast
Comparison: None.

CLINICAL DATA: Paranasal sinus region pain and congestion

EXAM:
PARANASAL SINUSES - COMPLETE 3 + VIEW

[Series 1: dg sinuses complete · 0.14mm/px · 4 of 4 slices shown]
[im 1/4]
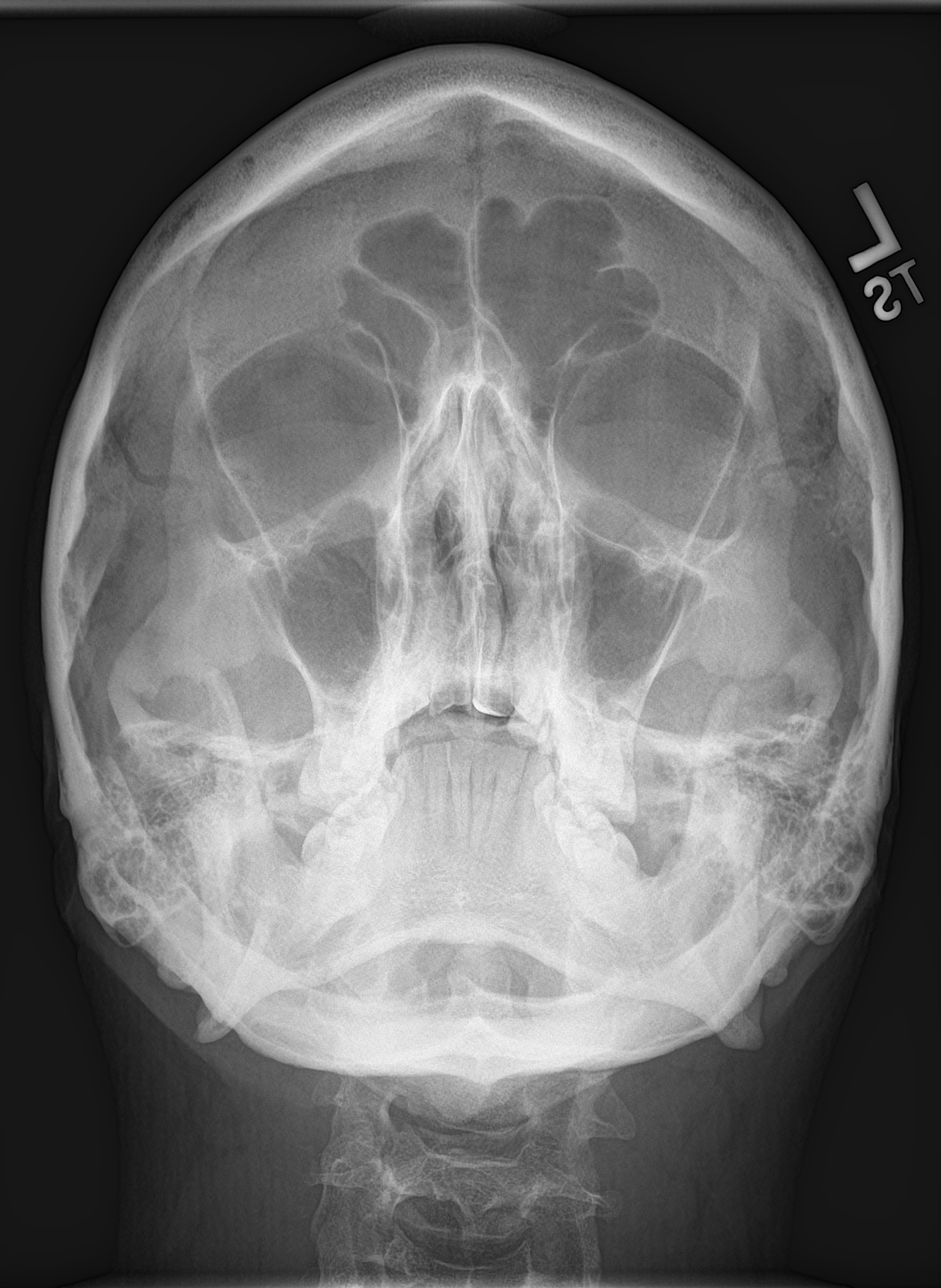
[im 2/4]
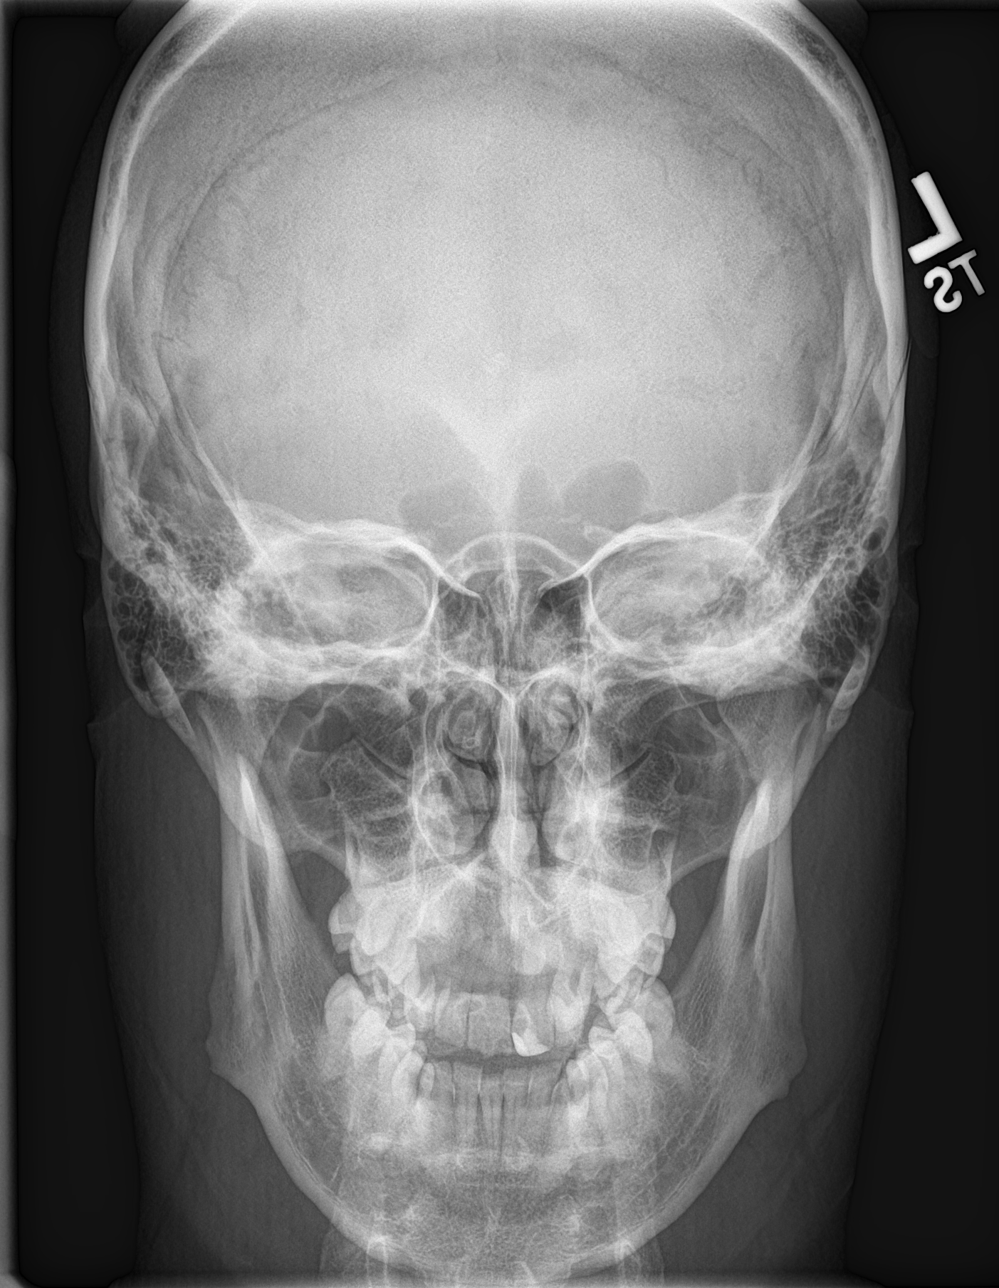
[im 3/4]
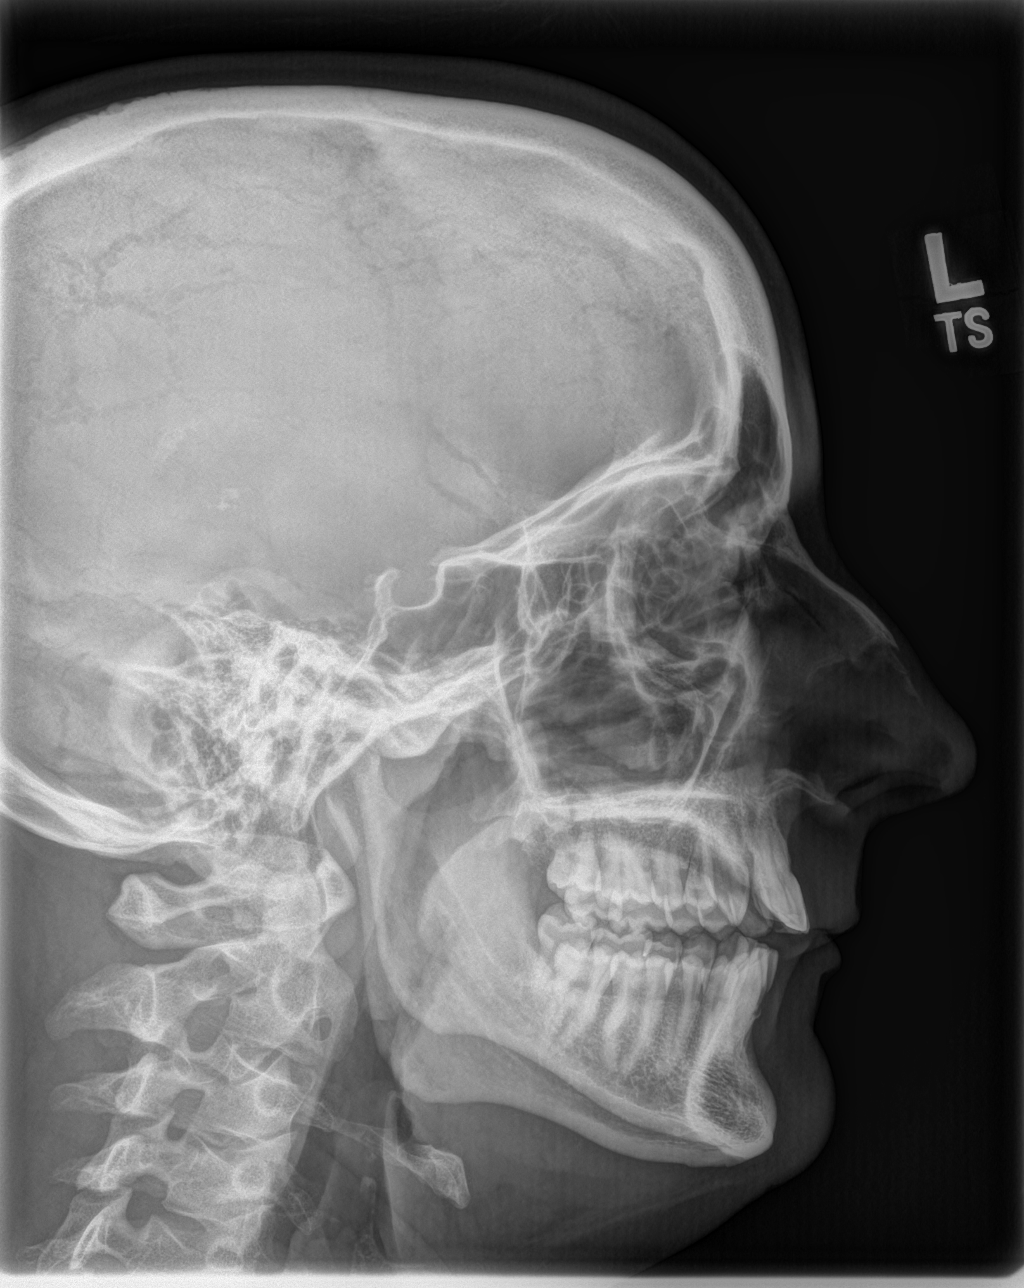
[im 4/4]
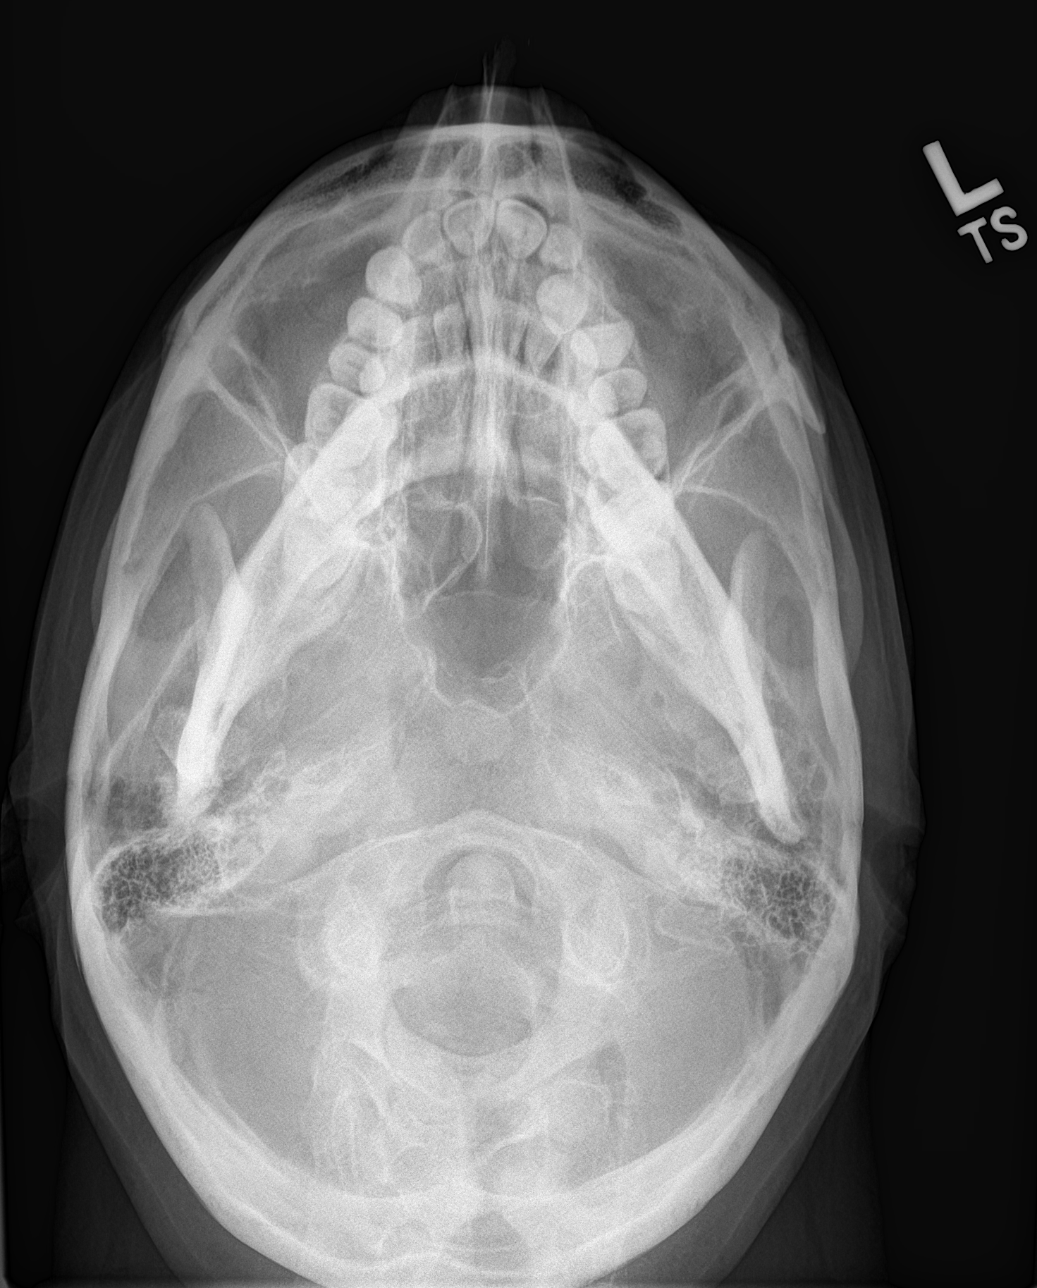

[4 of 4 positions shown; findings below may reference images not displayed]

FINDINGS: Liz, Adesugba, lateral, and submentovertex images were obtained.
Paranasal sinuses are clear. No air-fluid level. No bony destruction
or expansion. Mastoid air cells are clear.
IMPRESSION: Paranasal sinuses and mastoid air cells appear clear.

## 2018-11-27 ENCOUNTER — Ambulatory Visit (INDEPENDENT_AMBULATORY_CARE_PROVIDER_SITE_OTHER): Payer: 59 | Admitting: Psychology

## 2018-11-27 DIAGNOSIS — F4320 Adjustment disorder, unspecified: Secondary | ICD-10-CM | POA: Diagnosis not present

## 2018-12-06 ENCOUNTER — Encounter: Payer: Self-pay | Admitting: Family Medicine

## 2018-12-06 ENCOUNTER — Ambulatory Visit (INDEPENDENT_AMBULATORY_CARE_PROVIDER_SITE_OTHER): Payer: 59 | Admitting: Family Medicine

## 2018-12-06 ENCOUNTER — Other Ambulatory Visit: Payer: Self-pay

## 2018-12-06 VITALS — BP 128/75 | HR 86 | Temp 98.3°F | Ht 66.0 in | Wt 200.0 lb

## 2018-12-06 DIAGNOSIS — L2489 Irritant contact dermatitis due to other agents: Secondary | ICD-10-CM

## 2018-12-06 NOTE — Progress Notes (Signed)
Subjective:    Patient ID: Robert [name], male    DOB: 08-21-1980, 38 y.o.   MRN: [id number]  Robert [name] is a 38 y.o. male presenting on 12/06/2018 for Rash (genital area x 1 week, redness, denies itching.)   HPI  Genital / Groin area rash Reports noticed few days irritated red rash, seems mild problem, he wanted to get it checked out. Seems improved already today. He says triggered by sexual intercourse, had some discomfort with friction during one encounter. Uses condoms most of time. Admits he is in a separation currently and seeing other people. No known STD contact or other concerns. - Denies any itching, ulceration, drainage discharge, blister, discharge from penis, fever chills other rash or swelling   Depression screen [facility] 2/9 02/28/2018 01/10/2018 11/01/2017  Decreased Interest 0 0 0  Down, Depressed, Hopeless 0 0 0  PHQ - 2 Score 0 0 0  Altered sleeping - - -  Tired, decreased energy - - -  Change in appetite - - -  Feeling bad or failure about yourself  - - -  Trouble concentrating - - -  Moving slowly or fidgety/restless - - -  Suicidal thoughts - - -  PHQ-9 Score - - -  Difficult doing work/chores - - -    Social History   Tobacco Use  . Smoking status: Former Smoker    Packs/day: 1.00    Years: 15.00    Pack years: 15.00    Types: Cigarettes    Quit date: 10/10/2016    Years since quitting: 2.1  . Smokeless tobacco: Former Systems developer  . Tobacco comment: Quit using vaping  Substance Use Topics  . Alcohol use: No  . Drug use: No    Review of Systems Per HPI unless specifically indicated above     Objective:    BP 128/75 (BP Location: Left Arm, Patient Position: Sitting, Cuff Size: Normal)   Pulse 86   Temp 98.3 F (36.8 C) (Oral)   Ht 5\' 6"  (1.676 m)   Wt 200 lb (90.7 kg)   BMI 32.28 kg/m   Wt Readings from Last 3 Encounters:  12/06/18 200 lb (90.7 kg)  07/19/18 221 lb (100.2 kg)  07/11/18 227 lb (103 kg)    Physical Exam Vitals signs and  nursing note reviewed.  Constitutional:      General: He is not in acute distress.    Appearance: He is well-developed. He is not diaphoretic.     Comments: Well-appearing, comfortable, cooperative  HENT:     Head: Normocephalic and atraumatic.  Eyes:     General:        Right eye: No discharge.        Left eye: No discharge.     Conjunctiva/sclera: Conjunctivae normal.  Cardiovascular:     Rate and Rhythm: Normal rate.  Pulmonary:     Effort: Pulmonary effort is normal.  Genitourinary:    Penis: Normal.      Comments: Small area on shaft of penis near head with approx 1-2 cm region of minimal erythematous skin irritation appears dermatitis, no ulceration. No vesicle. Otherwise normal. Skin:    General: Skin is warm and dry.     Findings: No erythema or rash.  Neurological:     Mental Status: He is alert and oriented to person, place, and time.  Psychiatric:        Behavior: Behavior normal.     Comments: Well groomed, good eye contact, normal speech and thoughts  Results for orders placed or performed in visit on 09/06/18  Testosterone  Result Value Ref Range   Testosterone 601 264 - 916 ng/dL      Assessment & Plan:   Problem List Items Addressed This Visit    None    Visit Diagnoses    Irritant contact dermatitis due to other agents    -  Primary     Clinically with irritant/contact dermatitis localized area possibly friction related, seems improving and healing now No sign of secondary infection or STD. Unlikely syphillis or herpes based on history and exam.  Plan Reassurance today May use OTC hydrocortisone PRN 1 week or less May use topical barrier such as vaseline PRN    No orders of the defined types were placed in this encounter.   Follow up plan: Return if symptoms worsen or fail to improve.   Saralyn Pilar, DO [facility] 12/06/2018, 11:55 AM

## 2018-12-06 NOTE — Patient Instructions (Addendum)
Thank you for coming to the office today.  May use topical OTC low dose hydrocortisone small amount daily for up to 7 days if red and irritated. Can use vaseline or other skin barrier product daily as needed intermittently.  Follow-up if worse or new concern  Please schedule a Follow-up Appointment to: No follow-ups on file.  If you have any other questions or concerns, please feel free to call the office or send a message through [place]. You may also schedule an earlier appointment if necessary.  Additionally, you may be receiving a survey about your experience at our office within a few days to 1 week by e-mail or mail. We value your feedback.  Robert Putnam, DO [place]

## 2018-12-11 ENCOUNTER — Ambulatory Visit: Payer: 59 | Admitting: Psychology

## 2018-12-13 IMAGING — RF DG SWALLOWING FUNCTION - NRPT [facility]
13 series · 13 of 24 positions shown · non-contrast
Comparison: none

[Series 3: run · 1 of 53 frames shown (1 of 13)]
[frame 8/53]
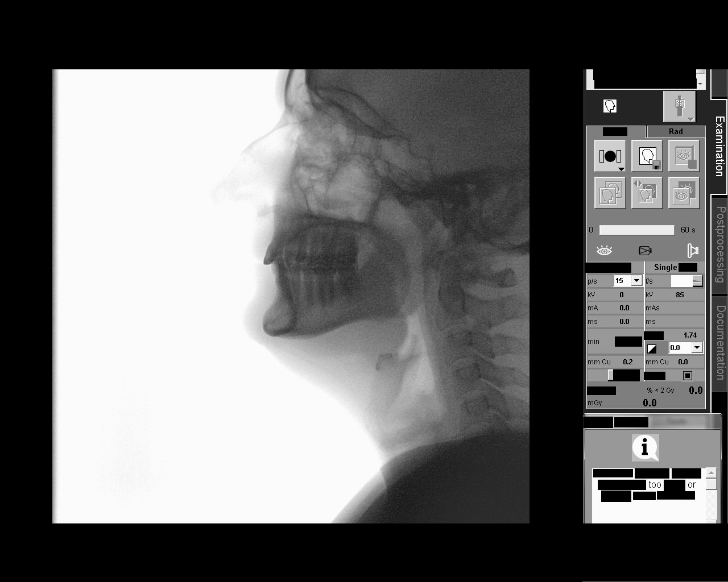

[Series 4: run · 1 of 103 frames shown (2 of 13)]
[frame 16/103]
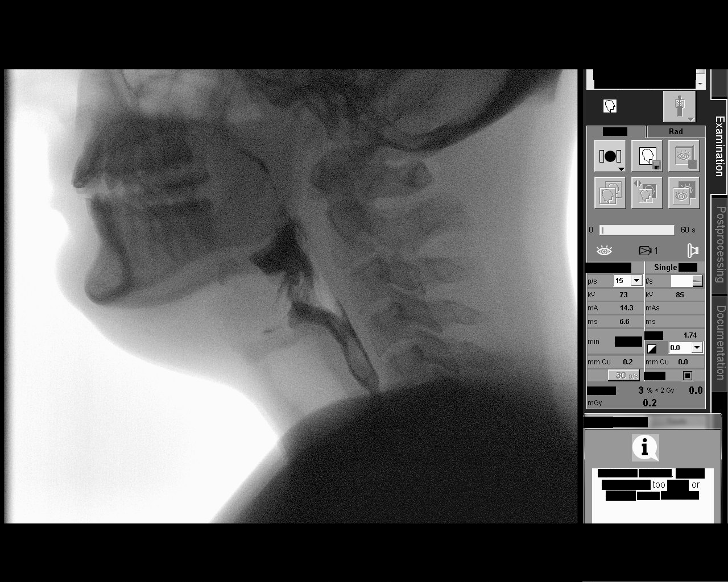

[Series 5: run · 1 of 13 frames shown (3 of 13)]
[frame 7/13]
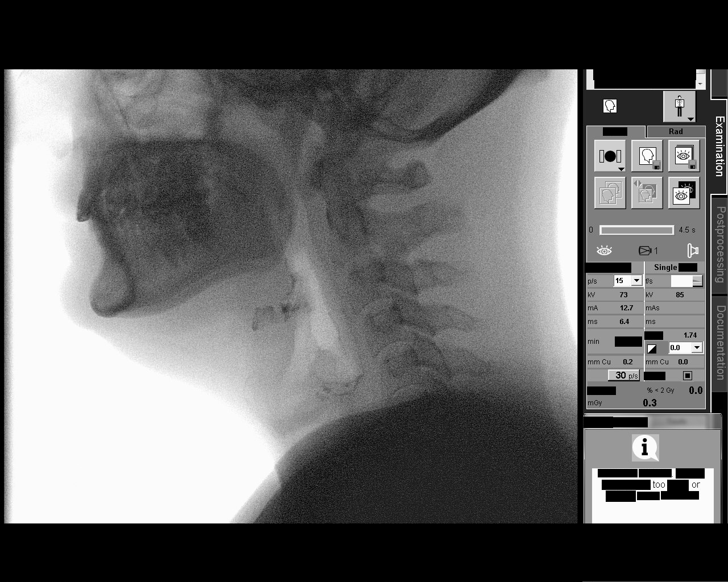

[Series 6: run · 1 of 107 frames shown (4 of 13)]
[frame 54/107]
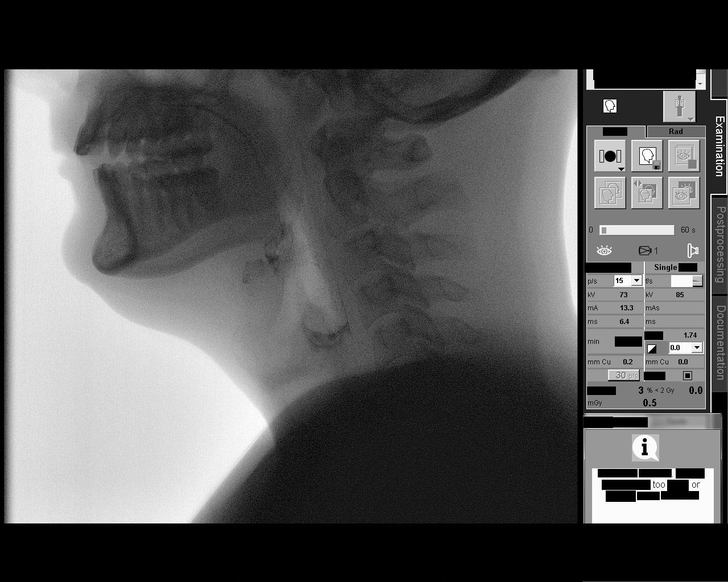

[Series 7: run · 1 of 326 frames shown (5 of 13)]
[frame 164/326]
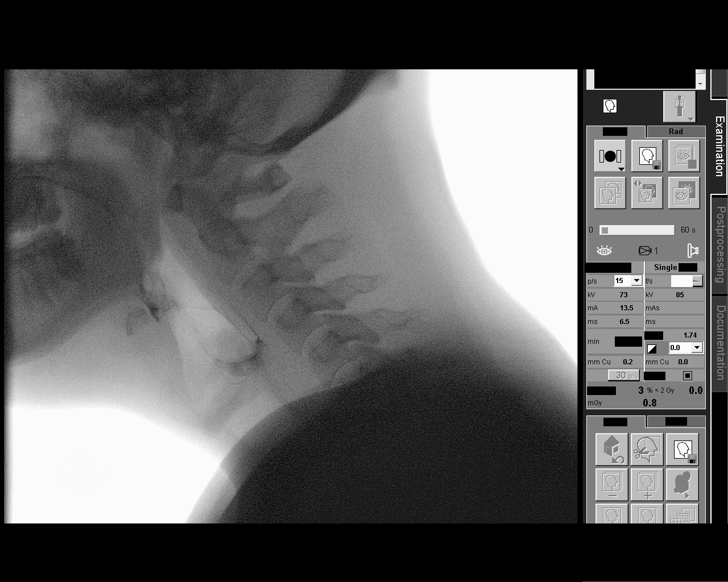

[Series 8: run · 1 of 247 frames shown (6 of 13)]
[frame 39/247]
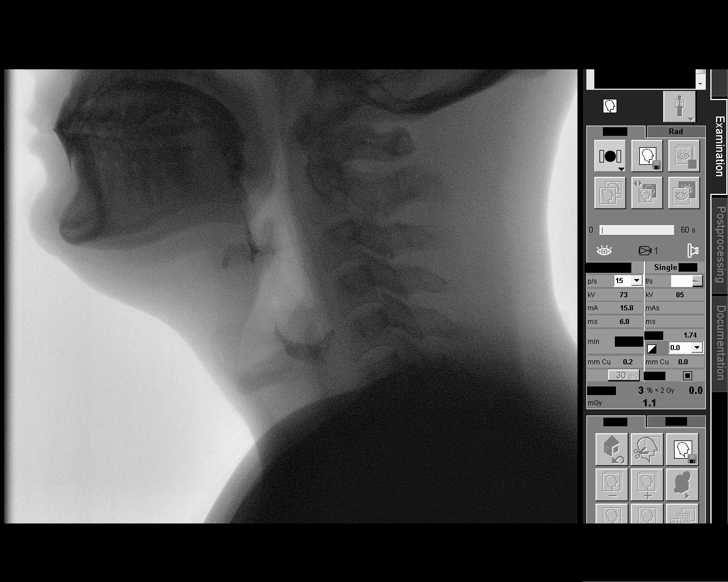

[Series 9: run · 1 of 285 frames shown (7 of 13)]
[frame 143/285]
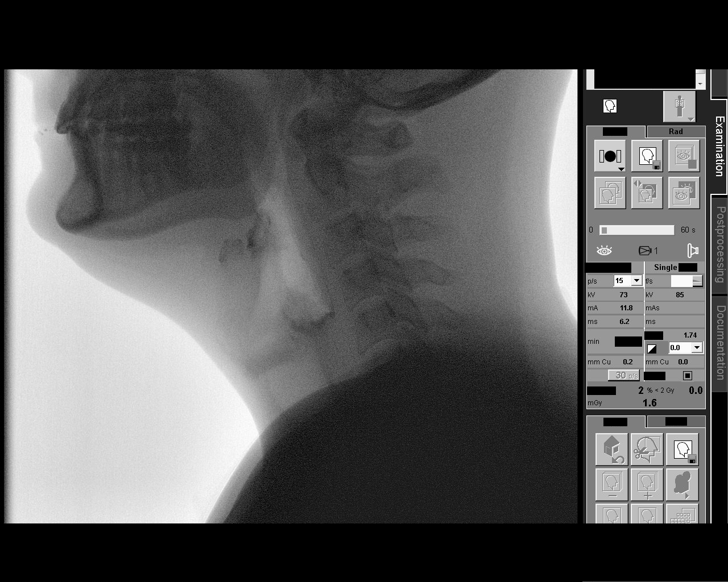

[Series 10: run · 1 of 276 frames shown (8 of 13)]
[frame 42/276]
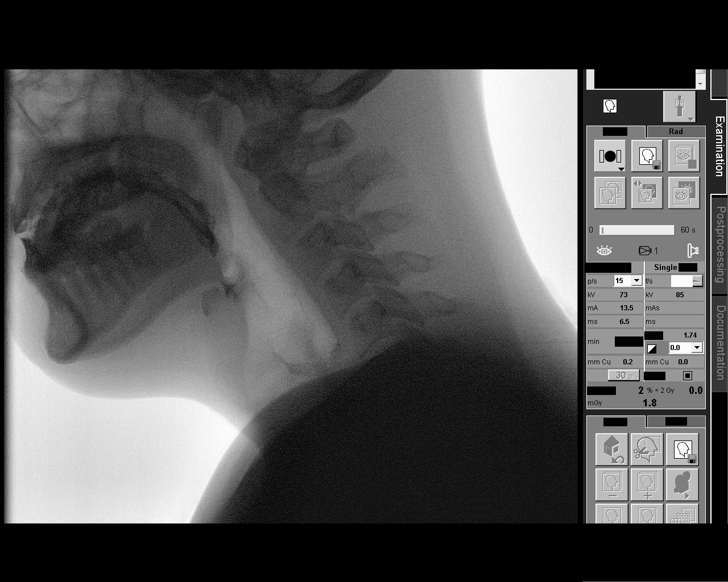

[Series 11: run · 1 of 481 frames shown (9 of 13)]
[frame 73/481]
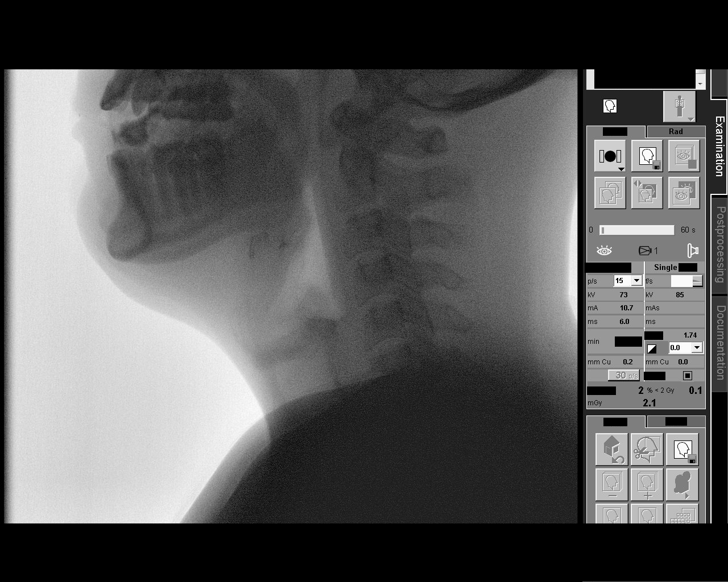

[Series 12: run · 1 of 508 frames shown (10 of 13)]
[frame 255/508]
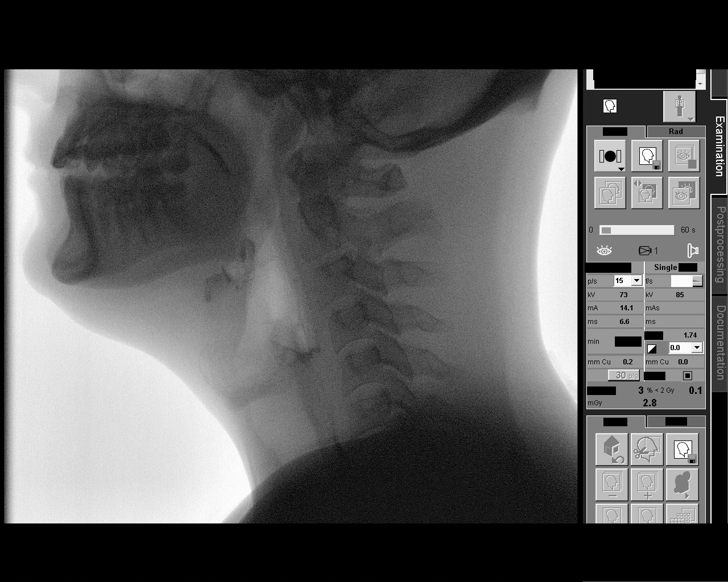

[Series 13: run · 1 of 326 frames shown (11 of 13)]
[frame 58/326]
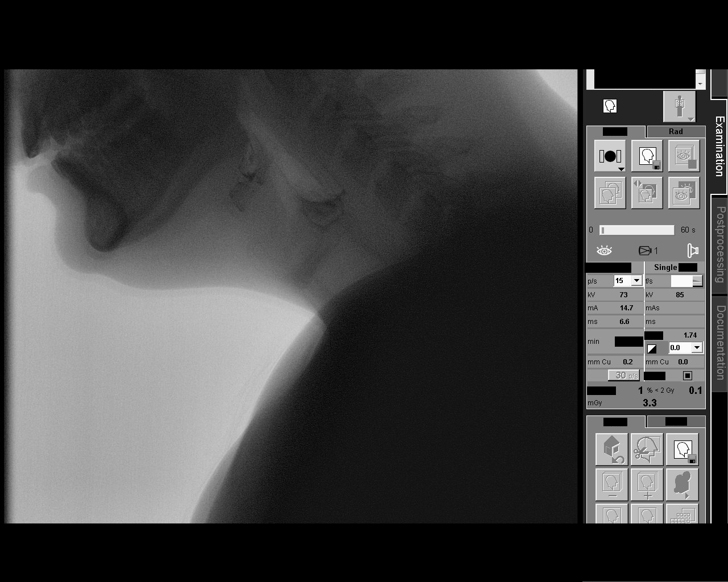

[Series 14: run · 1 of 45 frames shown (12 of 13)]
[frame 39/45]
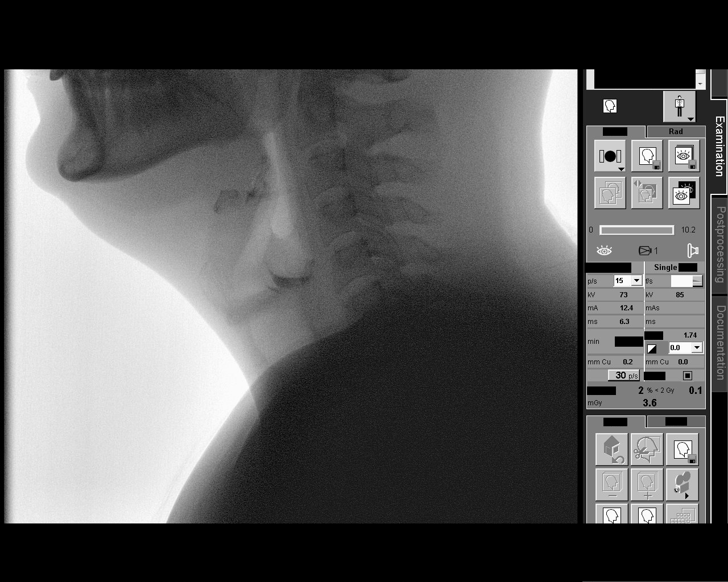

[Series 15: run · 1 of 33 frames shown (13 of 13)]
[frame 29/33]
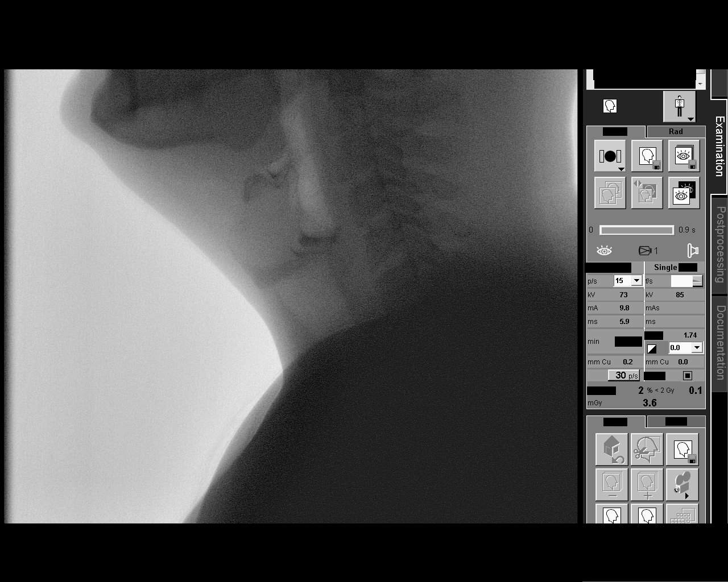

[13 of 24 positions shown; findings below may reference images not displayed]

FLUOROSCOPY FOR SWALLOWING FUNCTION STUDY:
Fluoroscopy was provided for swallowing function study, which was administered by a speech pathologist.  Final results and recommendations from this study are contained within the speech pathology report.

## 2019-01-23 ENCOUNTER — Encounter: Payer: Self-pay | Admitting: Urology

## 2019-01-23 ENCOUNTER — Other Ambulatory Visit: Payer: Self-pay

## 2019-01-23 ENCOUNTER — Ambulatory Visit (INDEPENDENT_AMBULATORY_CARE_PROVIDER_SITE_OTHER): Payer: 59 | Admitting: Urology

## 2019-01-23 DIAGNOSIS — E291 Testicular hypofunction: Secondary | ICD-10-CM

## 2019-01-23 NOTE — Progress Notes (Signed)
01/23/2019 2:58 PM   Robert [name] Mar 20, 1981 [id number]  Referring provider: Olin Hauser, DO [address] [place],  [place] [address]  Chief Complaint  Patient presents with  . Hypogonadism    last injection 01/17/2019   Urologic history: 1.  Hypogonadism  -Symptoms decreased libido, tiredness, fatigue, weight gain  -Pretreatment testosterone low 200 range with normal LH  -Initially elected Clomid trial with improvement and levels to 664 but no symptom improvement.  -Currently testosterone cypionate 200 mg every 2 weeks   HPI: 38 y.o. male presents for semiannual follow-up of hypogonadism.  His last injection was 1 week ago.  He does note increase symptoms toward the end of his cycle.  He otherwise has good energy level and libido.  He denies breast tenderness/enlargement, bothersome lower urinary tract symptoms or lower extremity edema.  PMH: Past Medical History:  Diagnosis Date  . ADHD   . GERD (gastroesophageal reflux disease)   . Wears contact lenses     Surgical History: Past Surgical History:  Procedure Laterality Date  . ESOPHAGOGASTRODUODENOSCOPY (EGD) WITH PROPOFOL N/A 06/14/2017   Procedure: ESOPHAGOGASTRODUODENOSCOPY (EGD) WITH PROPOFOL;  Surgeon: Lucilla Lame, MD;  Location: [place];  Service: Endoscopy;  Laterality: N/A;  . SEPTOPLASTY  06/13/2011  . WISDOM TOOTH EXTRACTION      Home Medications:  Allergies as of 01/23/2019   No Known Allergies     Medication List       Accurate as of January 23, 2019  2:58 PM. If you have any questions, ask your nurse or doctor.        2-3CC SYRINGE 3 ML Misc Use as directed   dexlansoprazole 60 MG capsule Commonly known as: Dexilant Take 1 capsule (60 mg total) by mouth daily. **PLEASE SCHEDULE FOLLOW UP APPT**   escitalopram 10 MG tablet Commonly known as: LEXAPRO TAKE 1 TABLET(10 MG) BY MOUTH DAILY   fluticasone 50 MCG/ACT nasal spray Commonly known as: FLONASE USE 2 SPRAYS IN EACH  NOSTRIL DAILY. USE FOR 4 TO 6 WEEKS, THEN STOP AND USE SEASONALLY OR AS NEEDED   NEEDLE (DISP) 18 G 18G X 1-1/2" Misc Commonly known as: BD Disp Needles Use 18G to draw up Testosterone medication   NEEDLE (DISP) 21 G 21G X 1-1/2" Misc Commonly known as: BD Eclipse Needle Use 21G needle to inject Testosterone medication.   testosterone cypionate 200 MG/ML injection Commonly known as: DEPOTESTOSTERONE CYPIONATE Inject 1 mL (200 mg total) into the muscle every 14 (fourteen) days.       Allergies: No Known Allergies  Family History: Family History  Problem Relation Age of Onset  . Cancer Maternal Grandfather 84       stomach  . Esophageal cancer Maternal Grandfather 22  . Colon cancer Neg Hx   . Prostate cancer Neg Hx   . Diabetes Neg Hx   . Hypertension Neg Hx   . Hypercholesterolemia Neg Hx     Social History:  reports that he quit smoking about 2 years ago. His smoking use included cigarettes. He has a 15.00 pack-year smoking history. He has quit using smokeless tobacco. He reports that he does not drink alcohol or use drugs.  ROS: UROLOGY Frequent Urination?: No Hard to postpone urination?: No Burning/pain with urination?: No Get up at night to urinate?: No Leakage of urine?: No Urine stream starts and stops?: No Trouble starting stream?: No Do you have to strain to urinate?: No Blood in urine?: No Urinary tract infection?: No Sexually  transmitted disease?: No Injury to kidneys or bladder?: No Painful intercourse?: No Weak stream?: No Erection problems?: No Penile pain?: No  Gastrointestinal Nausea?: No Vomiting?: No Indigestion/heartburn?: No Diarrhea?: No Constipation?: No  Constitutional Fever: No Night sweats?: No Weight loss?: No Fatigue?: No  Skin Skin rash/lesions?: No Itching?: No  Eyes Blurred vision?: No Double vision?: No  Ears/Nose/Throat Sore throat?: No Sinus problems?: No  Hematologic/Lymphatic Swollen glands?: No Easy  bruising?: No  Cardiovascular Leg swelling?: No Chest pain?: No  Respiratory Cough?: No Shortness of breath?: No  Endocrine Excessive thirst?: No  Musculoskeletal Back pain?: No Joint pain?: No  Neurological Headaches?: No Dizziness?: No  Psychologic Depression?: No Anxiety?: No  Physical Exam: BP 124/67 (BP Location: Left Arm, Patient Position: Sitting, Cuff Size: Normal)   Pulse (!) 108   Ht 5\' 6"  (1.676 m)   Wt 203 lb 1.6 oz (92.1 kg)   BMI 32.78 kg/m   Constitutional:  Alert and oriented, No acute distress. HEENT: [place] AT, moist mucus membranes.  Trachea midline, no masses. Cardiovascular: No clubbing, cyanosis, or edema. Respiratory: Normal respiratory effort, no increased work of breathing. Skin: No rashes, bruises or suspicious lesions. Neurologic: Grossly intact, no focal deficits, moving all 4 extremities. Psychiatric: Normal mood and affect.   [name]:    - Hypogonadism Stable symptoms on TRT though increased symptoms toward the end of his injection cycle.  I did discuss changing to a lower dose once weekly however he is reluctant due to the pain of intramuscular injection.  He was given information on Xyosted and if desired we can see if insurance will cover.  Testosterone, PSA and hematocrit were drawn.  If stable follow-up 6 months.   [name], MD  [facility] [address], [address] [place], [place] [address] [phone]

## 2019-01-24 LAB — HEMATOCRIT: Hematocrit: 49.3 % (ref 37.5–51.0)

## 2019-01-24 LAB — PSA: Prostate Specific Ag, Serum: 0.9 ng/mL (ref 0.0–4.0)

## 2019-01-24 LAB — TESTOSTERONE: Testosterone: 716 ng/dL (ref 264–916)

## 2019-01-28 ENCOUNTER — Telehealth: Payer: Self-pay

## 2019-01-28 NOTE — Telephone Encounter (Signed)
Mychart sent.

## 2019-01-28 NOTE — Telephone Encounter (Signed)
-----   Message from Abbie Sons, MD sent at 01/27/2019  3:38 PM EDT ----- PSA normal at 0.9.  Hematocrit was normal.  Testosterone looks good at 716.  Follow-up as scheduled

## 2019-02-19 ENCOUNTER — Telehealth: Payer: Self-pay

## 2019-02-20 MED ORDER — TESTOSTERONE CYPIONATE 200 MG/ML IM SOLN: 200.0000 mg | INTRAMUSCULAR | 3 refills | Status: DC

## 2019-02-20 NOTE — Telephone Encounter (Signed)
rx sent

## 2019-03-14 IMAGING — US US SOFT TISSUE HEAD/NECK
1 series · 14 of 18 positions shown · non-contrast
Comparison: None.

CLINICAL DATA: 37-year-old male with right neck pain and swelling
since a barium swallow study this past [place].

EXAM:
ULTRASOUND OF HEAD/NECK SOFT TISSUES
TECHNIQUE: Ultrasound examination of the head and neck soft tissues was
performed in the area of clinical concern.

[Series 1: us soft tissue head/neck · 14 of 18 slices shown]
[im 1/18]
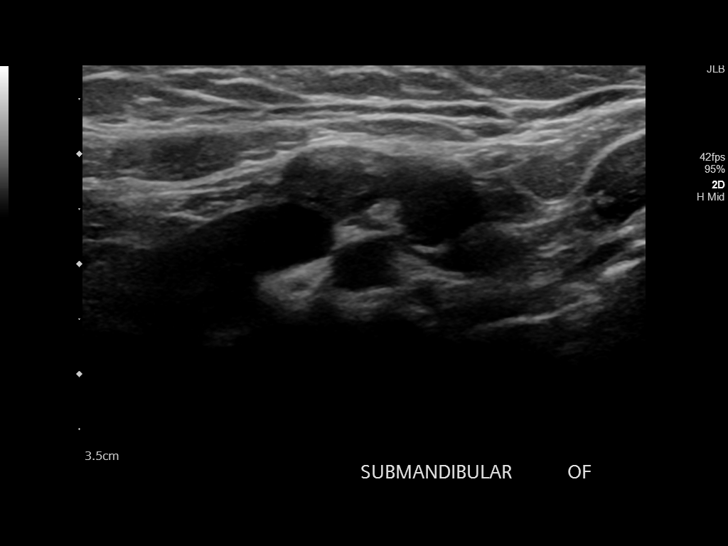
[im 2/18]
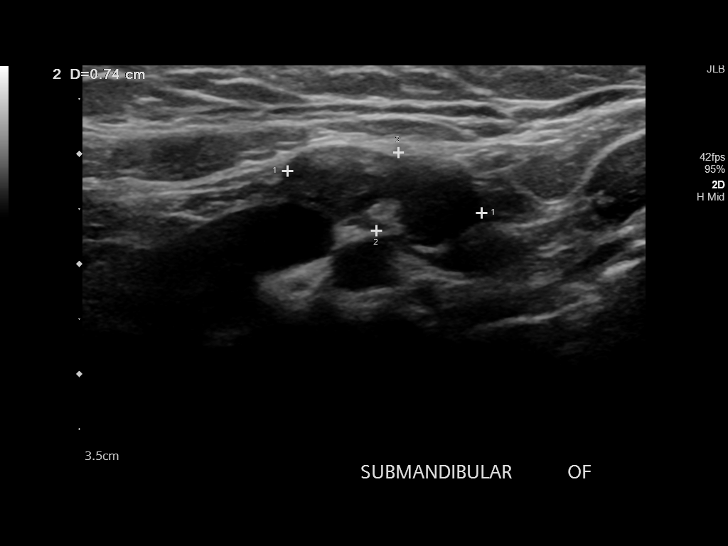
[im 4/18]
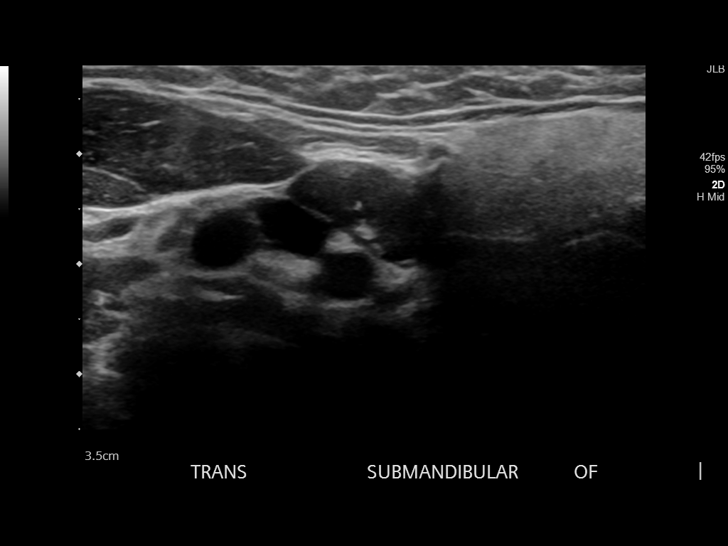
[im 5/18]
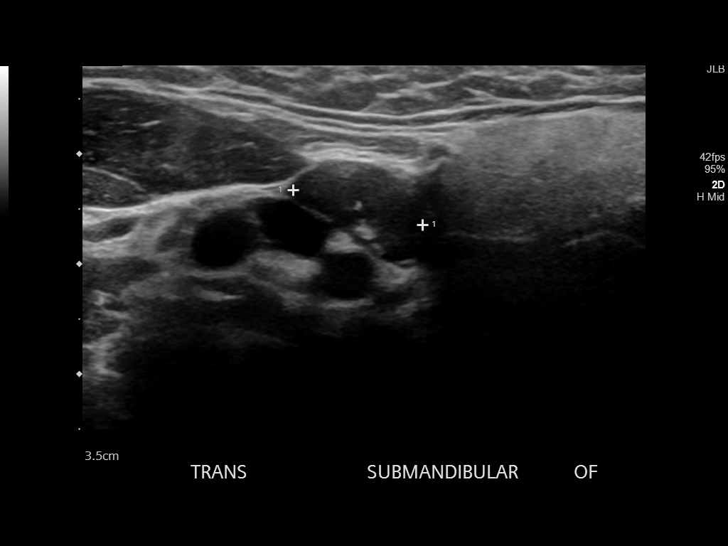
[im 6/18]
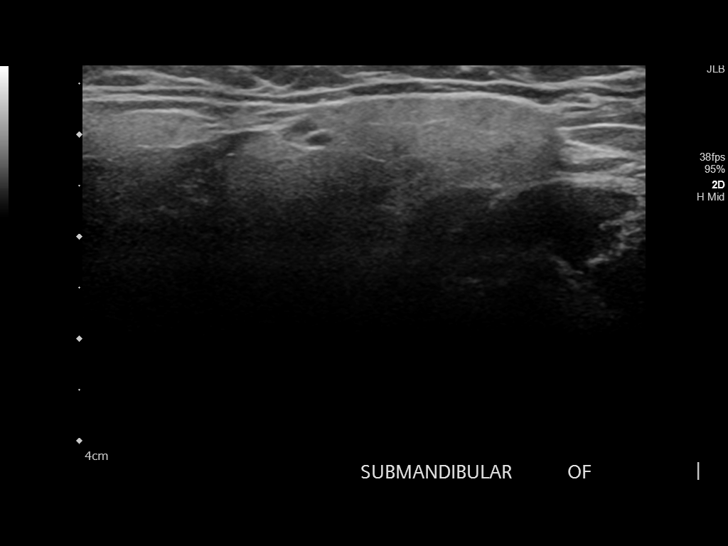
[im 8/18]
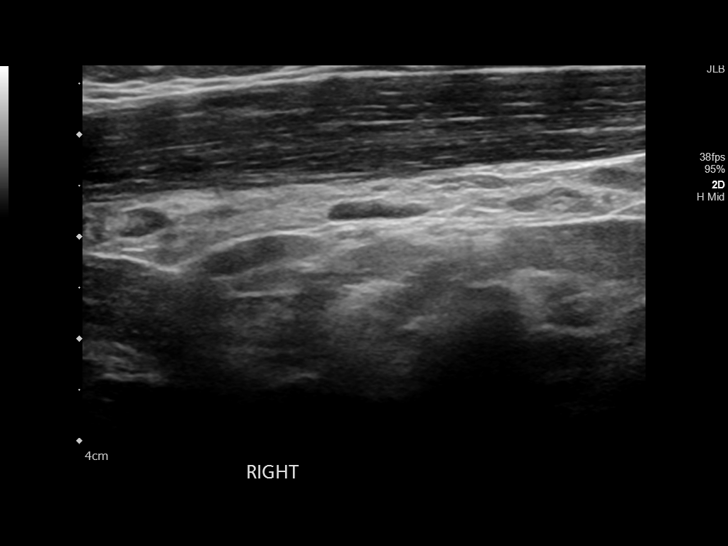
[im 9/18]
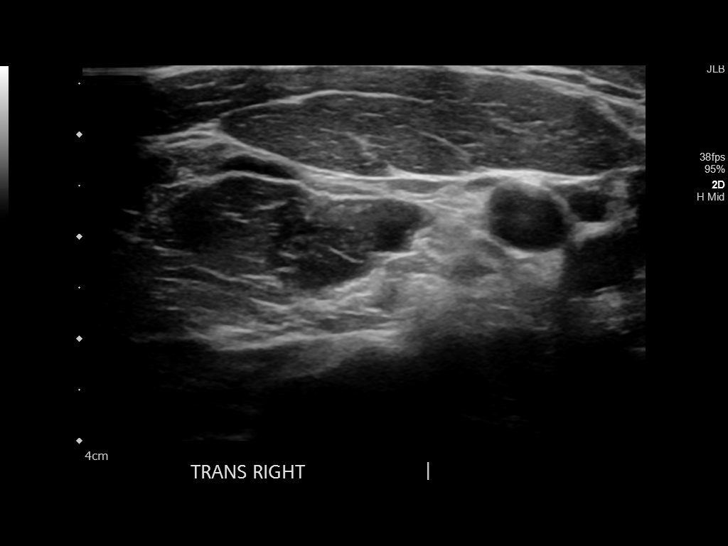
[im 10/18]
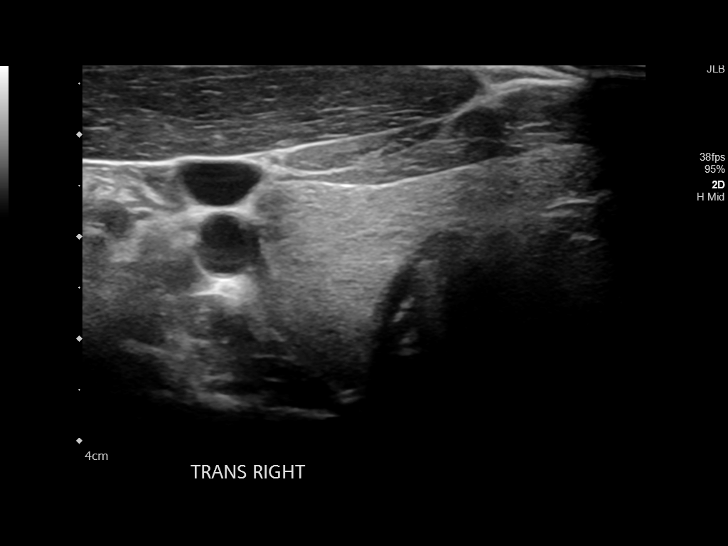
[im 11/18]
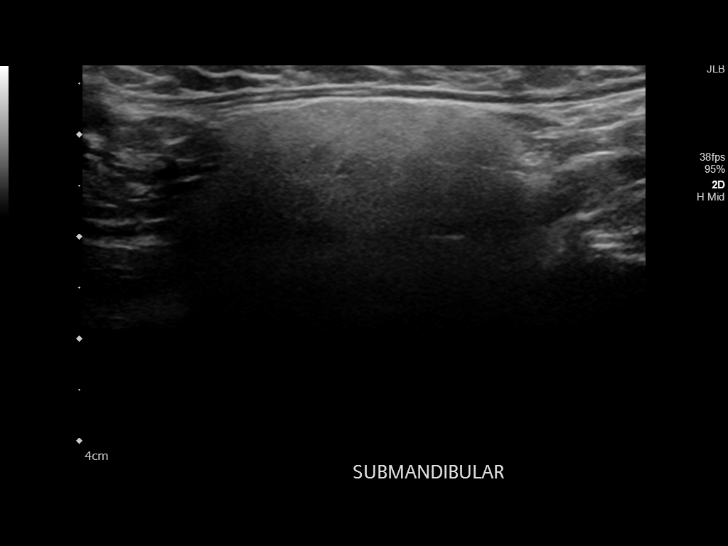
[im 13/18]
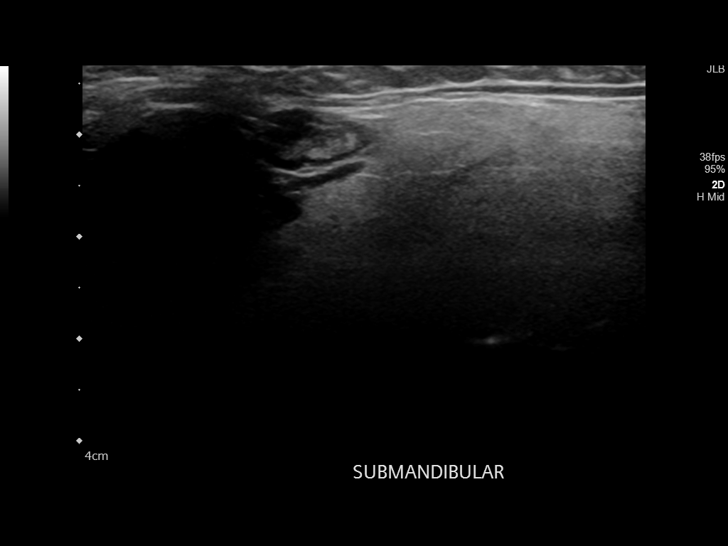
[im 14/18]
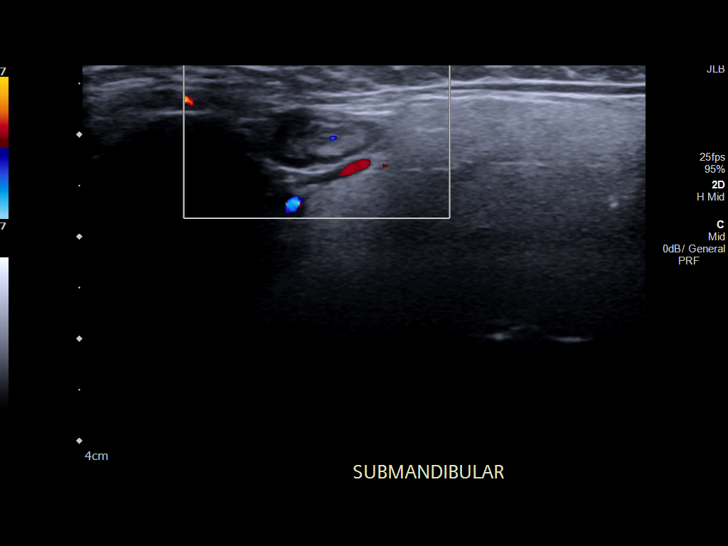
[im 15/18]
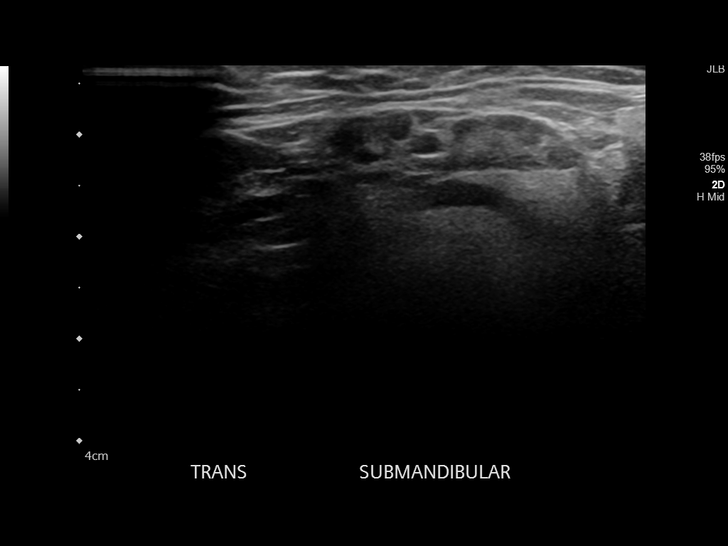
[im 17/18]
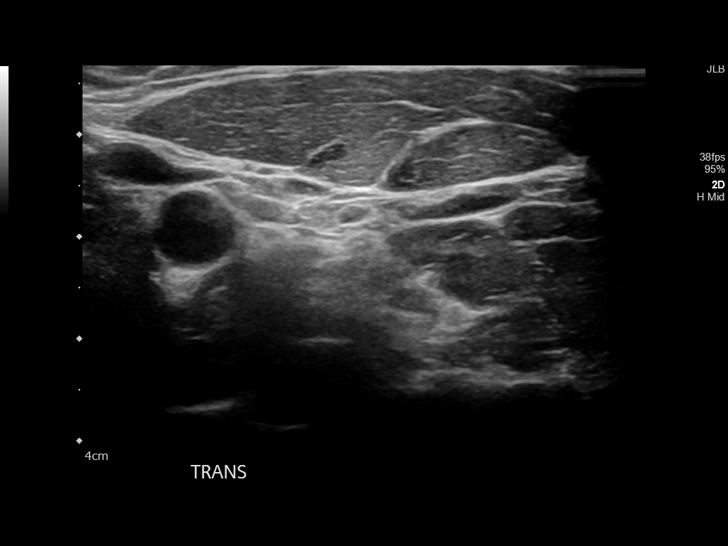
[im 18/18]
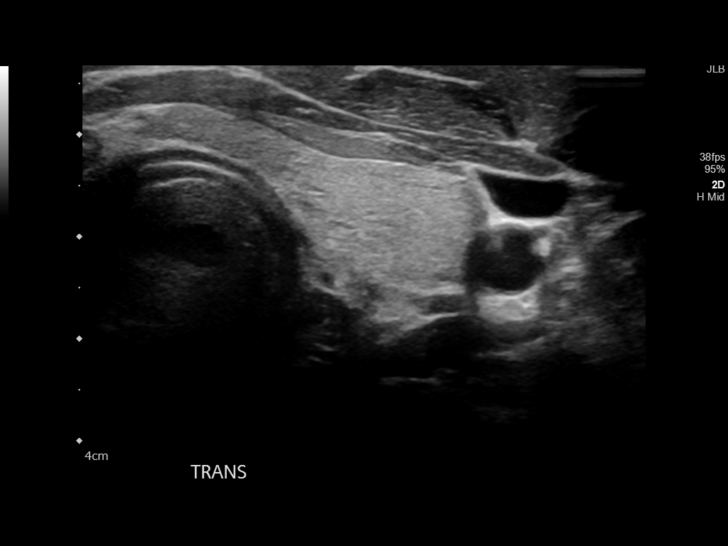

[14 of 18 positions shown; findings below may reference images not displayed]

FINDINGS: Sonographic interrogation of the region of clinical concern in the
right neck demonstrates a borderline enlarged jugular chain lymph
node measuring 1.8 x 0.7 x 1.2 cm. The echogenic fatty hilum and
vascular pedicle are preserved. The cortex is diffusely thickened
and relatively hypoechoic. No additional lymphadenopathy visualized.
IMPRESSION: Prominent and borderline enlarged right jugular chain lymph node.
Differential considerations include reactive lymphadenopathy and
less likely metastatic disease or lymphoma. Recommend continued
clinical surveillance. If there is evidence of enlargement over
time, repeat imaging may be warranted to confirm followed by tissue
sampling.

## 2019-03-23 ENCOUNTER — Other Ambulatory Visit: Payer: Self-pay | Admitting: Family Medicine

## 2019-03-23 DIAGNOSIS — F419 Anxiety disorder, unspecified: Secondary | ICD-10-CM

## 2019-04-15 ENCOUNTER — Other Ambulatory Visit: Payer: Self-pay

## 2019-04-15 ENCOUNTER — Encounter: Payer: Self-pay | Admitting: Family Medicine

## 2019-04-15 ENCOUNTER — Ambulatory Visit (INDEPENDENT_AMBULATORY_CARE_PROVIDER_SITE_OTHER): Payer: 59 | Admitting: Family Medicine

## 2019-04-15 VITALS — HR 85 | Ht 66.0 in | Wt 198.0 lb

## 2019-04-15 DIAGNOSIS — G4733 Obstructive sleep apnea (adult) (pediatric): Secondary | ICD-10-CM | POA: Diagnosis not present

## 2019-04-15 DIAGNOSIS — E669 Obesity, unspecified: Secondary | ICD-10-CM

## 2019-04-15 NOTE — Patient Instructions (Addendum)
Referral sent to [facility]  Stay tuned for next step - to get sleep study. They can also consult with their sleep specialist if needed.  Please schedule a Follow-up Appointment to: Return in about 4 weeks (around 05/13/2019), or if symptoms worsen or fail to improve, for sleep apnea.  If you have any other questions or concerns, please feel free to call the office or send a message through [place]. You may also schedule an earlier appointment if necessary.  Additionally, you may be receiving a survey about your experience at our office within a few days to 1 week by e-mail or mail. We value your feedback.  Nobie Putnam, DO [place]

## 2019-04-15 NOTE — Progress Notes (Signed)
Virtual Visit via Telephone The purpose of this virtual visit is to provide medical care while limiting exposure to the novel coronavirus (COVID19) for both patient and office staff.  Consent was obtained for phone visit:  Yes.   Answered questions that patient had about telehealth interaction:  Yes.   I discussed the limitations, risks, security and privacy concerns of performing an evaluation and management service by telephone. I also discussed with the patient that there may be a patient responsible charge related to this service. The patient expressed understanding and agreed to proceed.  Patient Location: Home [facility]  ---------------------------------------------------------------------- Chief Complaint  Patient presents with  . Sleep Apnea    pt been very fatigue and falling sleep throughout the day. Pt was told by his partner that he stop breathing while he's sleep     S: Reviewed CMA documentation. I have called patient and gathered additional HPI as follows:  Obstructive Sleep Apnea, moderate Prior diagnosis, [facility] See results below. He did not pursue CPAP therapy prior, he has been managed by ENT and GI / speech variety of therapy for his sinus / ENT issues with chronic laryngopharyngeal reflux among other symptoms. - Today he is requesting repeat sleep study for evaluation to determine if obstructive sleep apnea still present or if other sleep issue present. - He describes reduced energy and fatigue and tired during day. He has improved on testosterone but this doesn't seem to resolve this problem. - He has been aware that he feels obstructed breathing often even when he is in light sleep or falling asleep. Also his partner has noticed he stopped breathing multiple times while sleeping.    Epworth Sleepiness Scale Total Score: 12 Sitting and reading - 3 Watching TV - 3 Sitting inactive in a public  place - 1 As a passenger in a car for an hour without a break - 1 Lying down to rest in the afternoon when circumstances permit - 3 Sitting and talking to someone - 0 Sitting quietly after a lunch without alcohol - 1 In a car, while stopped for a few minutes in traffic - 0  STOP-Bang OSA scoring Snoring yes   Tiredness yes   Observed apneas yes   Pressure HTN no   BMI > 35 kg/m2 no   Age > 50  no   Neck (male >17 in; Male >16 in)  no 6315"  Gender male yes   OSA risk low (0-2)  OSA risk intermediate (3-4)  OSA risk high (5+)  Total:  4 high risk    Denies any high risk travel to areas of current concern for COVID19. Denies any known or suspected exposure to person with or possibly with COVID19.  Denies any fevers, chills, sweats, body ache, cough, shortness of breath, sinus pain or pressure, headache, abdominal pain, diarrhea  Past Medical History:  Diagnosis Date  . ADHD   . GERD (gastroesophageal reflux disease)   . Wears contact lenses    Social History   Tobacco Use  . Smoking status: Former Smoker    Packs/day: 1.00    Years: 15.00    Pack years: 15.00    Types: Cigarettes    Quit date: 10/10/2016    Years since quitting: 2.5  . Smokeless tobacco: Former NeurosurgeonUser  . Tobacco comment: Quit using vaping  Substance Use Topics  . Alcohol use: No  . Drug use: No    Current Outpatient Medications:  .  dexlansoprazole (DEXILANT) 60 MG capsule, Take 1 capsule (60 mg total) by mouth daily. **PLEASE SCHEDULE FOLLOW UP APPT**, Disp: 90 capsule, Rfl: 1 .  escitalopram (LEXAPRO) 10 MG tablet, TAKE 1 TABLET(10 MG) BY MOUTH DAILY, Disp: 90 tablet, Rfl: 0 .  fluticasone (FLONASE) 50 MCG/ACT nasal spray, USE 2 SPRAYS IN EACH NOSTRIL DAILY. USE FOR 4 TO 6 WEEKS, THEN STOP AND USE SEASONALLY OR AS NEEDED, Disp: 16 g, Rfl: 3 .  NEEDLE, DISP, 18 G (BD DISP NEEDLES) 18G X 1-1/2" MISC, Use 18G to draw up Testosterone medication, Disp: 50 each, Rfl: 0 .  NEEDLE, DISP, 21 G (BD ECLIPSE  NEEDLE) 21G X 1-1/2" MISC, Use 21G needle to inject Testosterone medication., Disp: 50 each, Rfl: 0 .  Syringe, Disposable, (2-3CC SYRINGE) 3 ML MISC, Use as directed, Disp: 100 each, Rfl: 0 .  testosterone cypionate (DEPOTESTOSTERONE CYPIONATE) 200 MG/ML injection, Inject 1 mL (200 mg total) into the muscle every 14 (fourteen) days., Disp: 4 mL, Rfl: 3  Depression screen [facility] 2/9 04/15/2019 02/28/2018 01/10/2018  Decreased Interest 0 0 0  Down, Depressed, Hopeless 0 0 0  PHQ - 2 Score 0 0 0  Altered sleeping - - -  Tired, decreased energy - - -  Change in appetite - - -  Feeling bad or failure about yourself  - - -  Trouble concentrating - - -  Moving slowly or fidgety/restless - - -  Suicidal thoughts - - -  PHQ-9 Score - - -  Difficult doing work/chores - - -    GAD 7 : Generalized Anxiety Score 02/28/2018 11/01/2017 05/23/2017  Nervous, Anxious, on Edge 0 0 1  Control/stop worrying 0 0 0  Worry too much - different things 0 1 2  Trouble relaxing 0 1 3  Restless 0 0 3  Easily annoyed or irritable 0 0 0  Afraid - awful might happen 0 0 1  Total GAD 7 Score 0 2 10  Anxiety Difficulty Not difficult at all Not difficult at all Somewhat difficult    -------------------------------------------------------------------------- O: No physical exam performed due to remote telephone encounter.  Lab results reviewed.  2014 December Polysomnography (Standard)[facility]/28/2013 [facility] Result Narrative  IDENTIFICATION  Last Name: Florestine AversCislak File Name:  First Name: Apolinar JunesBrandon  Rec. Date: 05/25/2013 21:22 MR#: [id number] MSLT: Yes No Race:    Date of Birth: 1980-10-15 Height: N/A Age: 38 yrs 11 mos Weight: N/A Sex: M BMI: N/A    Sleep Phys.: Alessandra BevelsVaughn Fellow:  Referring Phys.: Meta HatchetSignore, MD Date Dictated: 05/28/13  HISTORY: Pt is a [age] yrs 6311 mos year old who is being evaluated for  possible sleep apnea.  Other Known Medical Issues: None  Medications: None  Epworth  Sleepiness Scale (total score) 6  Routine Bedtime: 11:30 pm - 12 midnight  Routine Rise Time: 7:30 - 8 am  Previous Night's Bedtime: 10:45 pm Rise Time: 7:40 am Typical Sleep  Daytime Activity  Caffeine Intake: O  Naps: O (total minutes)  Alcohol: O Exercise: yes / am    Procedure: A multi-channel polysomnogram was recorded digitally and  stored using a [employer] polygraph. The input montage provided  multiple recording channels of central, temporal and occipital EEG, EOG,  submental EMG, arm and leg limb surface EMG, airflow from nasal pressure  and nasal/oral thermocouple, intercostal EMG, chest and abdominal movement  via respiratory inductive plethysmography belts, end tidal CO2 via a BCI  capnograph sampled through a nasal cannula, arterial blood oxygen via a  finger probe, and EKG via a modified Lead I. The polysomnograph was  reviewed in multiple montages. Time locked digital video was recorded  with the polysomnogram and selected segments reviewed. The patient was  allowed to sleep until spontaneously awakening. The study was scored  using the AASM 2014 guidelines for Hypopneas.  STUDY RESULTS  Sleep Description: The study started at 05/25/2013 21:22 and ended at 05/26/2013 7:23. At  the time of initiation of the study the heart was 83 bpm, respiratory rate  was 16 bpm, end tidal CO2 was NA torr and the oxygen saturation was 97%.  The total recording time was 484 minutes with a total sleep time of 408.5  minutes. The patient's sleep latency was 11 minute(s) with 64.5 minute(s)  of wake time recorded after sleep onset. The REM latency was 86 minutes.  The sleep efficiency was 84.4%.  Total wake time during the night was 75.5 minute(s), which was 15.6% of  the total recording time. The sleep stage percentages are as follows:  Stage N1 = 5.5%, Stage N2 = 49.6%, Stage N3= 16.9%, and Stage R (REM  sleep) = 28.0%. The overall sleep architecture  showed the majority of  slow wave sleep to be in the early part of the night and the majority of  the REM sleep to be in the latter part of the night.  Respiratory The patient had a total of 195 respiratory event(s) for an AHI of 28.6  per hour. There were 33 obstructive apnea(s), 0 mixed apnea(s), 12  central apnea(s) and 150 hypopnea(s). 84 event(s) occurred in Stage REM,  111 event(s) were noted in NREM. Total AI was  6.6. These respiratory  events were associated with arousals and oxygen desaturation to a low of  81%. Snoring is noted.  A total of 3 RERAs were noted for a RDI of 29.1.  Cheyne Stokes was not noted. The maximal end tidal CO2 during sleep was 52.1 torr.   The patient spent 408.5 minute(s) in the supine position with a total of  111 events in NREM sleep in the supine position and 84 events in REM sleep  in the supine position. The supine AHI is 28.6.  The patient spent 0.0 minute(s) in a side body.  The patient spent 0 minute(s) in the prone position. The patient spent 294 minutes in NREM sleep with 111 events during NREM  sleep. The NREM AHI is 22.7.  The patient spent 114.5 minute(s) in REM sleep, of which 114.5 minutes  were supine and 0 minutes were lateral. 84 event(s) occurred during REM  sleep. The REM AHI is 44.0, REM supine AHI is 44.0 , and REM lateral  AHI is 0. The average O2 saturation (SpO2) for the night was 91.7. For 86.9% of the  night at saturation over 90% was monitored, for 13.1% of the night the  saturation ranged between 80% and 90% and 0.0% of the night was spent at a  saturation between 50% and 80%. The total time spent with O2 saturation  =<88% was 22.1 minutes.  Limb Movements There were a total of 89 periodic limb movement events with a PLM Index of   13.1, 5 of which were associated with arousals, which calculated to 0.7  event(s) per hour during sleep. 36 spontaneous arousal(s) were noted with  an index of 5.3  arousal(s) per hour of sleep.   Other Associated Events The average heart rate in sleep was 77 with the average in REM sleep 75  and NREM 77. The peak heart rate in sleep was 111.  The patient had no  clear events of gastroesophageal reflux, cardiac arrhythmias or  epileptiform activity on routine review of the study.  IMPRESSION: This study is abnormal due to the presence of : 1. Obstructive sleep apnea - moderate - the patient should be considered  for therapy to promote airway patency such as oral appliance, CPAP  titration, or surgery. 2. Periodic Limb Movements in Sleep - the patient should be questioned  regarding symptoms of Restless Legs Syndrome and given consideration for  obtaining a ferritin level.      ________________________________M.D.  Bonnee Quin MD  Diplomate of the American Board of Sleep Medicine Diplomate of Sleep Medicine, ABPN  Other Result Information  Lynett Grimes, MD - 05/28/2013  6:14 PM EST IDENTIFICATION  Last Name: Defina File Name:  First Name: Kisean  Rec. Date: 05/25/2013 21:22 MR#: [id number] MSLT: Yes  No Race:       Date of Birth: 1980-07-05 Height: N/A Age: 68 yrs 11 mos Weight: N/A Sex: M BMI: N/A  Sleep Phys.: Alessandra Bevels Fellow:  Referring Phys.: Meta Hatchet, MD Date Dictated: 05/28/13  HISTORY:  Pt is a 58 yrs 73 mos  year old who is being evaluated for possible sleep apnea.   Other Known Medical Issues: None  Medications: None  Epworth Sleepiness Scale (total score)  6  Routine Bedtime:  11:30 pm - 12 midnight   Routine Rise Time: 7:30 - 8 am  Previous Night's  Bedtime:  10:45 pm Rise Time: 7:40 am  Typical  Sleep  Daytime Activity  Caffeine Intake: O  Naps:  O  (total minutes)  Alcohol:  O Exercise:  yes / am      Procedure:  A multi-channel polysomnogram was recorded digitally and stored using a [employer] polygraph.  The input montage provided multiple recording channels of central, temporal and  occipital EEG, EOG, submental EMG, arm and leg limb surface EMG, airflow from nasal pressure and nasal/oral thermocouple, intercostal EMG, chest and abdominal movement via respiratory inductive plethysmography belts, end tidal CO2 via a BCI capnograph sampled through a nasal cannula, arterial blood oxygen via a finger probe, and EKG via a modified Lead I.  The polysomnograph was reviewed in multiple montages.  Time locked digital video was recorded with the polysomnogram and selected segments reviewed. The patient was allowed to sleep until spontaneously awakening.  The study was scored using the AASM 2014 guidelines for Hypopneas.  STUDY RESULTS  Sleep Description: The study started at 05/25/2013 21:22 and ended at 05/26/2013 7:23.  At the time of initiation of the study the heart was 83 bpm, respiratory rate was 16 bpm, end tidal CO2 was NA torr and the oxygen saturation was 97%. The total recording time was 484 minutes with a total sleep time of 408.5 minutes.  The patient's sleep latency was 11 minute(s) with 64.5 minute(s) of wake time recorded after sleep onset.  The REM latency was 86 minutes. The sleep efficiency was 84.4%.  Total wake time during the night was  75.5 minute(s), which was 15.6% of the total recording time.  The sleep stage percentages are as follows: Stage N1 =  5.5%, Stage N2 = 49.6%, Stage N3= 16.9%, and Stage R (REM sleep) = 28.0%.  The overall sleep architecture showed the majority of slow wave sleep to be in the early part of the night and the majority of the REM sleep to be in the latter  part of the night.  Respiratory The patient had a total of 195 respiratory event(s) for an AHI of  28.6 per hour.  There were 33 obstructive apnea(s), 0 mixed apnea(s), 12 central apnea(s) and 150 hypopnea(s).  84 event(s) occurred in Stage REM, 111 event(s) were noted in NREM.  Total AI was   6.6. These respiratory events were associated with arousals and oxygen desaturation to a low of 81%.  Snoring is noted.   A total of 3 RERAs were noted for a RDI of  29.1. Cheyne Stokes was not noted. The maximal end tidal CO2 during sleep was  52.1 torr.   The patient spent 408.5 minute(s) in the supine position with a total of 111 events in NREM sleep in the supine position and 84 events in REM sleep in the supine position.  The supine AHI is 28.6.  The patient spent 0.0 minute(s) in a side body.  The patient spent 0 minute(s) in the prone position. The patient spent 294 minutes in NREM sleep with 111 events during NREM sleep.  The NREM AHI is  22.7.  The patient spent 114.5 minute(s) in REM sleep, of which 114.5 minutes were supine and 0 minutes were lateral. 84 event(s) occurred during REM sleep.  The REM AHI is  44.0, REM supine AHI is  44.0 , and REM lateral AHI is 0. The average O2 saturation (SpO2) for the night was 91.7.  For 86.9% of the night at saturation over 90% was monitored, for 13.1% of the night the saturation ranged between 80% and 90% and 0.0% of the night was spent at a saturation between 50% and 80%.  The total time spent with O2 saturation =<88% was 22.1 minutes.  Limb Movements There were a total of 89 periodic limb movement events with a PLM Index of  13.1, 5 of which were associated with arousals, which calculated to 0.7 event(s) per hour during sleep.  36 spontaneous arousal(s) were noted with an index of 5.3 arousal(s) per hour of sleep.   Other Associated Events The average heart rate in sleep was 77 with the average in REM sleep 75 and NREM 77. The peak heart rate in sleep was 111.   The patient had no clear  events of gastroesophageal reflux, cardiac arrhythmias or epileptiform activity on routine review of the study.   IMPRESSION: This study is abnormal due to the presence of : 1. Obstructive sleep apnea - moderate - the patient should be considered for therapy to promote airway patency such as oral appliance, CPAP titration, or surgery. 2. Periodic Limb Movements in  Sleep - the patient should be questioned regarding symptoms of Restless Legs Syndrome and given consideration for obtaining a ferritin level.      ________________________________M.D.  Bonnee Quin MD  Diplomate of the American Board of Sleep Medicine Diplomate of Sleep Medicine, ABPN     Recent Results (from the past 2160 hour(s))  Hematocrit     Status: None   Collection Time: 01/23/19  3:24 PM  Result Value Ref Range   Hematocrit 49.3 37.5 - 51.0 %  Testosterone     Status: None   Collection Time: 01/23/19  3:24 PM  Result Value Ref Range   Testosterone 716 264 - 916 ng/dL    Comment: Adult male reference interval is based on a population of healthy nonobese males (BMI <30) between 63 and 32 years old. Travison, et.al. JCEM 978 [id number]. PMID: 62703500.   PSA     Status:  None   Collection Time: 01/23/19  3:24 PM  Result Value Ref Range   Prostate Specific Ag, Serum 0.9 0.0 - 4.0 ng/mL    Comment: Roche ECLIA methodology. According to the American Urological Association, Serum PSA should decrease and remain at undetectable levels after radical prostatectomy. The AUA defines biochemical recurrence as an initial PSA value 0.2 ng/mL or greater followed by a subsequent confirmatory PSA value 0.2 ng/mL or greater. Values obtained with different assay methods or kits cannot be used interchangeably. Results cannot be interpreted as absolute evidence of the presence or absence of malignant disease.     -------------------------------------------------------------------------- A&P:  Problem List Items Addressed This Visit    Suspected sleep apnea - Primary   Obesity (BMI 35.0-39.9 without comorbidity)     Persistent clinical concern for known untreated obstructive sleep apnea and possibly other related sinus/respiratory sleep symptoms given reported symptoms with witnessed apnea, snoring and sleep disturbance, fatigue excessive sleepiness. - Screening: ESS score  12 / STOP-Bang Score 4 - Co-morbidities: GERD, Anxiety  Plan: 1. Discussion on initial diagnosis and testing for OSA, risk factors, management, complications 2. Agree to proceed with sleep study testing based on prior results and persistent symptoms - Fax note and order to [facility], restart with initial PSG since last 2014, then progress to CPAP titration and further sleep consultation as indicated if unresolved  No orders of the defined types were placed in this encounter.   Follow-up: - Return within 4 weeks as needed for sleep apnea  Patient verbalizes understanding with the above medical recommendations including the limitation of remote medical advice.  Specific follow-up and call-back criteria were given for patient to follow-up or seek medical care more urgently if needed.   - Time spent in direct consultation with patient on phone: 11 minutes  Saralyn Pilar, DO [facility] 04/15/2019, 3:53 PM

## 2019-04-24 ENCOUNTER — Encounter: Payer: Self-pay | Admitting: Family Medicine

## 2019-04-30 ENCOUNTER — Telehealth: Payer: Self-pay | Admitting: Family Medicine

## 2019-04-30 NOTE — Telephone Encounter (Signed)
Pt is calling for his sleep study. Pt call back # is  [phone].

## 2019-05-01 NOTE — Telephone Encounter (Signed)
The pt was informed that she need to contact [facility] to get her results.

## 2019-06-11 ENCOUNTER — Other Ambulatory Visit: Payer: Self-pay | Admitting: Nurse Practitioner

## 2019-06-11 DIAGNOSIS — F419 Anxiety disorder, unspecified: Secondary | ICD-10-CM

## 2019-07-29 ENCOUNTER — Ambulatory Visit: Payer: 59 | Admitting: Urology

## 2019-07-30 ENCOUNTER — Encounter: Payer: Self-pay | Admitting: Urology

## 2019-07-30 ENCOUNTER — Other Ambulatory Visit: Payer: Self-pay

## 2019-07-30 ENCOUNTER — Ambulatory Visit (INDEPENDENT_AMBULATORY_CARE_PROVIDER_SITE_OTHER): Payer: 59 | Admitting: Urology

## 2019-07-30 VITALS — BP 128/85 | HR 97 | Ht 66.0 in | Wt 205.0 lb

## 2019-07-30 DIAGNOSIS — E291 Testicular hypofunction: Secondary | ICD-10-CM | POA: Diagnosis not present

## 2019-07-30 NOTE — Progress Notes (Signed)
07/30/2019 12:42 PM   Robert [name] September 18, 1980 [id number]  Referring provider: Olin Hauser, DO [address] [place],  [place] [address]  Chief Complaint  Patient presents with  . Hypogonadism    Urologic history: 1. Hypogonadism -Trial Clomid 12/19; improved T but not symptoms -TRT started 07/2018 -Symptoms tiredness, fatigue, decreased libido -Testosterone cypionate 200 mg every 2 weeks   HPI: 39 y.o. male presents for semiannual follow-up.  He last injected almost 2 weeks ago and is due to inject on 2/19.  He has stable libido and energy level.  No bothersome lower urinary tract symptoms.  He has noted some increased hair loss and is inquiring if this may be related to testosterone   PMH: Past Medical History:  Diagnosis Date  . ADHD   . GERD (gastroesophageal reflux disease)   . Wears contact lenses     Surgical History: Past Surgical History:  Procedure Laterality Date  . ESOPHAGOGASTRODUODENOSCOPY (EGD) WITH PROPOFOL N/A 06/14/2017   Procedure: ESOPHAGOGASTRODUODENOSCOPY (EGD) WITH PROPOFOL;  Surgeon: Robert Lame, MD;  Location: [place];  Service: Endoscopy;  Laterality: N/A;  . SEPTOPLASTY  06/13/2011  . WISDOM TOOTH EXTRACTION      Home Medications:  Allergies as of 07/30/2019   No Known Allergies     Medication List       Accurate as of July 30, 2019 12:42 PM. If you have any questions, ask your nurse or doctor.        2-3CC SYRINGE 3 ML Misc Use as directed   dexlansoprazole 60 MG capsule Commonly known as: Dexilant Take 1 capsule (60 mg total) by mouth daily. **PLEASE SCHEDULE FOLLOW UP APPT**   escitalopram 10 MG tablet Commonly known as: LEXAPRO TAKE 1 TABLET(10 MG) BY MOUTH DAILY   fluticasone 50 MCG/ACT nasal spray Commonly known as: FLONASE USE 2 SPRAYS IN EACH NOSTRIL DAILY. USE FOR 4 TO 6 WEEKS, THEN STOP AND USE SEASONALLY OR AS NEEDED   NEEDLE (DISP) 18 G 18G X 1-1/2" Misc Commonly known as: BD Disp  Needles Use 18G to draw up Testosterone medication   NEEDLE (DISP) 21 G 21G X 1-1/2" Misc Commonly known as: BD Eclipse Needle Use 21G needle to inject Testosterone medication.   PreviDent 5000 Dry Mouth 1.1 % Gel dental gel Generic drug: sodium fluoride USE AS DIRECTED TO BRUSH TWICE DAILY   testosterone cypionate 200 MG/ML injection Commonly known as: DEPOTESTOSTERONE CYPIONATE Inject 1 mL (200 mg total) into the muscle every 14 (fourteen) days.       Allergies: No Known Allergies  Family History: Family History  Problem Relation Age of Onset  . Cancer Maternal Grandfather 84       stomach  . Esophageal cancer Maternal Grandfather 25  . Colon cancer Neg Hx   . Prostate cancer Neg Hx   . Diabetes Neg Hx   . Hypertension Neg Hx   . Hypercholesterolemia Neg Hx     Social History:  reports that he quit smoking about 2 years ago. His smoking use included cigarettes. He has a 15.00 pack-year smoking history. He has quit using smokeless tobacco. He reports that he does not drink alcohol or use drugs.   Physical Exam: BP 128/85 (BP Location: Left Arm, Patient Position: Sitting, Cuff Size: Large)   Pulse 97   Ht 5\' 6"  (1.676 m)   Wt 205 lb (93 kg)   BMI 33.09 kg/m   Constitutional:  Alert and oriented, No acute distress. HEENT: [place] AT,  moist mucus membranes.  Trachea midline, no masses. Cardiovascular: No clubbing, cyanosis, or edema. Respiratory: Normal respiratory effort, no increased work of breathing. Skin: No rashes, bruises or suspicious lesions. Neurologic: Grossly intact, no focal deficits, moving all 4 extremities. Psychiatric: Normal mood and affect.    [name]:    - Hypogonadism in male Stable symptoms on TRT.  He has recently noted some hair loss and we discussed adding Propecia.  He wanted to hold off at present. Testosterone and hematocrit were drawn today. He also inquired about other newer forms of testosterone replacement.  We discussed  Xyosted and approval of oral testosterone which I have not prescribed as it was approved just prior to COVID-19 pandemic  If labs stable follow-up PSA, testosterone, hematocrit in 6 months and office visit 1 year    Robert Altes, MD  [facility] [address], [address] [place], [place] [address] [phone]

## 2019-07-31 LAB — HEMATOCRIT: Hematocrit: 49.5 % (ref 37.5–51.0)

## 2019-07-31 LAB — TESTOSTERONE: Testosterone: 288 ng/dL (ref 264–916)

## 2019-08-01 ENCOUNTER — Encounter: Payer: Self-pay | Admitting: Urology

## 2019-08-01 ENCOUNTER — Telehealth: Payer: Self-pay | Admitting: *Deleted

## 2019-08-01 NOTE — Telephone Encounter (Signed)
Notified patient as instructed, patient pleased. Discussed follow-up appointments, patient agrees  

## 2019-08-01 NOTE — Telephone Encounter (Signed)
-----   Message from [name], MD sent at 07/31/2019 10:43 PM EST ----- Testosterone level was low normal however this was a trough level.  Hematocrit was normal.

## 2019-08-04 ENCOUNTER — Other Ambulatory Visit: Payer: Self-pay | Admitting: Gastroenterology

## 2019-08-05 DIAGNOSIS — K219 Gastro-esophageal reflux disease without esophagitis: Secondary | ICD-10-CM

## 2019-08-05 MED ORDER — OMEPRAZOLE 40 MG PO CPDR: 40.0000 mg | DELAYED_RELEASE_CAPSULE | Freq: Every day | ORAL | 1 refills | Status: DC

## 2019-09-08 DIAGNOSIS — F419 Anxiety disorder, unspecified: Secondary | ICD-10-CM

## 2019-09-09 MED ORDER — ESCITALOPRAM OXALATE 20 MG PO TABS: 20.0000 mg | ORAL_TABLET | Freq: Every day | ORAL | 1 refills | Status: DC

## 2019-10-01 ENCOUNTER — Other Ambulatory Visit: Payer: Self-pay | Admitting: *Deleted

## 2019-10-01 MED ORDER — TESTOSTERONE CYPIONATE 200 MG/ML IM SOLN: 200.0000 mg | INTRAMUSCULAR | 3 refills | Status: DC

## 2019-11-13 ENCOUNTER — Ambulatory Visit (INDEPENDENT_AMBULATORY_CARE_PROVIDER_SITE_OTHER): Payer: 59 | Admitting: Family Medicine

## 2019-11-13 ENCOUNTER — Encounter: Payer: Self-pay | Admitting: Family Medicine

## 2019-11-13 ENCOUNTER — Other Ambulatory Visit: Payer: Self-pay

## 2019-11-13 VITALS — BP 134/87 | HR 88 | Temp 97.5°F | Resp 16 | Ht 66.0 in | Wt 232.6 lb

## 2019-11-13 DIAGNOSIS — F419 Anxiety disorder, unspecified: Secondary | ICD-10-CM | POA: Diagnosis not present

## 2019-11-13 DIAGNOSIS — G5603 Carpal tunnel syndrome, bilateral upper limbs: Secondary | ICD-10-CM | POA: Diagnosis not present

## 2019-11-13 DIAGNOSIS — B356 Tinea cruris: Secondary | ICD-10-CM

## 2019-11-13 MED ORDER — BUSPIRONE HCL 5 MG PO TABS: 5.0000 mg | ORAL_TABLET | Freq: Two times a day (BID) | ORAL | 2 refills | Status: DC | PRN

## 2019-11-13 MED ORDER — TERBINAFINE HCL 250 MG PO TABS: 250.0000 mg | ORAL_TABLET | Freq: Every day | ORAL | 0 refills | Status: DC

## 2019-11-13 NOTE — Progress Notes (Signed)
Subjective:    Patient ID: Robert [name], male    DOB: Dec 25, 1980, 39 y.o.   MRN: [id number]  Robert [name] is a 39 y.o. male presenting on 11/13/2019 for Anxiety (lexapro--wants to Taper off ), Numbness (hands onset month), and Recurrent Skin Infections (onset 2 weeks)   HPI   Follow-up Anxiety / Tourette's Previously managed on Escitalopram (Lexapro) 20mg  daily, says it does help his anxiety but overall he thinks the 10 was about the same as 20. He says less issues of dizziness. He does admit to reduced libido on med. Asks about change of med now. He asks about Wellbutrin. Has not been on any other medication.  Hand Numbness, history of Carpal Tunnel Reports L > R hand, with thumb, index and middle fingers with numbness episode, it is both hands affected, he can feel his 4th and 5th finger, he often wakes up in morning and will feel numbness, traditionally he sleeps on one side and other hand can be numb. It can happen episodically throughout the day. He was kayaking last weekend and had issues with his Left wrist. He has used a wrist splint on Left wrist some relief. If not wearing splint and he is doing repetitive activity with L wrist he will have symptoms some weakness. Issue with repetitive activities now has more problems.  Tinea Cruris / jock itch Reports problem for past 2 weeks, recurrent issue with redness rash irritation burning at times in scrotal/groin area, has used topical powder, anti fungal therapy topical with temporary relief, tries to keep dry   Depression screen [facility] 2/9 11/13/2019 04/15/2019 02/28/2018  Decreased Interest 0 0 0  Down, Depressed, Hopeless 0 0 0  PHQ - 2 Score 0 0 0  Altered sleeping - - -  Tired, decreased energy - - -  Change in appetite - - -  Feeling bad or failure about yourself  - - -  Trouble concentrating - - -  Moving slowly or fidgety/restless - - -  Suicidal thoughts - - -  PHQ-9 Score - - -  Difficult doing work/chores - - -   GAD 7 :  Generalized Anxiety Score 11/13/2019 02/28/2018 11/01/2017 05/23/2017  Nervous, Anxious, on Edge 1 0 0 1  Control/stop worrying 0 0 0 0  Worry too much - different things 0 0 1 2  Trouble relaxing 1 0 1 3  Restless 1 0 0 3  Easily annoyed or irritable 0 0 0 0  Afraid - awful might happen 0 0 0 1  Total GAD 7 Score 3 0 2 10  Anxiety Difficulty Somewhat difficult Not difficult at all Not difficult at all Somewhat difficult     Social History   Tobacco Use  . Smoking status: Former Smoker    Packs/day: 1.00    Years: 15.00    Pack years: 15.00    Types: Cigarettes    Quit date: 10/10/2016    Years since quitting: 3.0  . Smokeless tobacco: Former Systems developer  . Tobacco comment: Quit using vaping  Substance Use Topics  . Alcohol use: No  . Drug use: No    Review of Systems Per HPI unless specifically indicated above     Objective:    BP 134/87   Pulse 88   Temp (!) 97.5 F (36.4 C) (Temporal)   Resp 16   Ht 5\' 6"  (1.676 m)   Wt 232 lb 9.6 oz (105.5 kg)   SpO2 98%   BMI 37.54 kg/m  Wt Readings from Last 3 Encounters:  11/13/19 232 lb 9.6 oz (105.5 kg)  07/30/19 205 lb (93 kg)  04/15/19 198 lb (89.8 kg)    Physical Exam Vitals and nursing note reviewed.  Constitutional:      General: He is not in acute distress.    Appearance: He is well-developed. He is not diaphoretic.     Comments: Well-appearing, comfortable, cooperative  HENT:     Head: Normocephalic and atraumatic.  Eyes:     General:        Right eye: No discharge.        Left eye: No discharge.     Conjunctiva/sclera: Conjunctivae normal.  Cardiovascular:     Rate and Rhythm: Normal rate.  Pulmonary:     Effort: Pulmonary effort is normal.  Musculoskeletal:     Comments: Bilateral wrists Provoked symptoms L>R thumb index middle fingers some tingling numbness with radiating symptoms, not affected 4th and 5th finger, has brace on left wrist, no edema or erythema  Skin:    General: Skin is warm and dry.      Findings: Rash (bilateral groin skin folds and scrotum with some patches of inflamed skin) present. No erythema.  Neurological:     Mental Status: He is alert and oriented to person, place, and time.  Psychiatric:        Behavior: Behavior normal.     Comments: Well groomed, good eye contact, normal speech and thoughts    Results for orders placed or performed in visit on 07/30/19  Testosterone  Result Value Ref Range   Testosterone 288 264 - 916 ng/dL  Hematocrit  Result Value Ref Range   Hematocrit 49.5 37.5 - 51.0 %      Assessment & Plan:   Problem List Items Addressed This Visit    Carpal tunnel syndrome on both sides    Clinically most consistent with acute on chronic gradual worsening L>R hand carpal tunnel syndrome. History and exam not suggestive of other nerve impingement or complication - Suspected secondary to chronic repetitive overuse - Temporary improve with wrist splint and rest, no other meds tried  Plan: 1. Discussed course of carpal tunnel syndrome, treatment, prognosis, complications 2. - START NSAID trial with Naproxen Armond Hang 500mg  BID wc 1-2 weeks then PRN may extend up to 1-2 weeks, then PRN - Take Tylenol PRN breakthrough 3. START bilateral wrist splint support (avoid flex/ext repetition) wear overnight to bed, may use during day with excessive or repetitive activities 4. Follow-up within 4-6 weeks if not improved consider - prednisone burst, referral to Neurology for nerve conduction study vs Orthopedics for surgical consultation for release if indicated      Relevant Medications   busPIRone (BUSPAR) 5 MG tablet   Anxiety - Primary    Stable chronic problem Now ready to change medication due to reduced libido on SSRI Lexapro Seems anxiety is mostly well controlled similar on lexapro 10 vs 20mg   Taper instructions SSRI Lexapro 20 down to 0 in 4 weeks handout AVS New Buspar for anxiety 5mg  BID PRN dosing reviewed, can do regular vs intermittent dose  adjust if need Future consider other med options, less likely wellbutrin that he asked about given no depression      Relevant Medications   busPIRone (BUSPAR) 5 MG tablet    Other Visit Diagnoses    Tinea cruris       Relevant Medications   terbinafine (LAMISIL) 250 MG tablet    inadequate response on topical  anti fungal powder / cream OTC Trial oral terbinafine   Meds ordered this encounter  Medications  . busPIRone (BUSPAR) 5 MG tablet    Sig: Take 1 tablet (5 mg total) by mouth 2 (two) times daily as needed (anxiety).    Dispense:  60 tablet    Refill:  2  . terbinafine (LAMISIL) 250 MG tablet    Sig: Take 1 tablet (250 mg total) by mouth daily. For 2 weeks    Dispense:  14 tablet    Refill:  0      Follow up plan: Return in about 3 months (around 02/13/2020) for Carpal Tunnel / Anxiety.    Saralyn Pilar, DO [facility] 11/13/2019, 10:09 AM

## 2019-11-13 NOTE — Assessment & Plan Note (Signed)
Clinically most consistent with acute on chronic gradual worsening L>R hand carpal tunnel syndrome. History and exam not suggestive of other nerve impingement or complication - Suspected secondary to chronic repetitive overuse - Temporary improve with wrist splint and rest, no other meds tried  Plan: 1. Discussed course of carpal tunnel syndrome, treatment, prognosis, complications 2. - START NSAID trial with Naproxen Armond Hang 500mg  BID wc 1-2 weeks then PRN may extend up to 1-2 weeks, then PRN - Take Tylenol PRN breakthrough 3. START bilateral wrist splint support (avoid flex/ext repetition) wear overnight to bed, may use during day with excessive or repetitive activities 4. Follow-up within 4-6 weeks if not improved consider - prednisone burst, referral to Neurology for nerve conduction study vs Orthopedics for surgical consultation for release if indicated

## 2019-11-13 NOTE — Assessment & Plan Note (Signed)
Stable chronic problem Now ready to change medication due to reduced libido on SSRI Lexapro Seems anxiety is mostly well controlled similar on lexapro 10 vs 20mg   Taper instructions SSRI Lexapro 20 down to 0 in 4 weeks handout AVS New Buspar for anxiety 5mg  BID PRN dosing reviewed, can do regular vs intermittent dose adjust if need Future consider other med options, less likely wellbutrin that he asked about given no depression

## 2019-11-13 NOTE — Patient Instructions (Addendum)
Thank you for coming to the office today.  Medication Taper Off Instructions Current med - Lexapro 20mg   Week 1: Alternate every OTHER day - Lexapro 20mg  (whole tab) and then next day 10mg  (HALF tab) Week 2-3: 10 mg daily (HALF tablet) Week 4: 10mg  every OTHER day (HALF tablet) - alternating day is NO medicine (skip dose) STOP completely  Optional weeks 5-6: you may extend the week 4 plan for up to 3 weeks if need. Alternating half pill one day and no pill the next. May space out to every 3 days if need.  Go ahead and start Buspar 5mg  new medicine, can take one whole pill as needed either 1-2 times a day.  ---------------------------  You most likely have Carpal Tunnel Syndrome of Both hand/wrist based on your symptoms. This a problem of compression on the nerve entering the hand at the wrist. Often it is caused by long history of overuse or repetitive activities that put strain on the nerve within wrist. Occasionally this can be caused by swelling or weight gain and pressure on this nerve as well.  Recommend trial of Anti-inflammatory with Aleve or Naproxen 250-500mg  with food and plenty of water TWICE daily every day (breakfast and dinner), for next 1 to 2 weeks, then you may take only as needed - DO NOT TAKE any ibuprofen, motrin while you are taking this medicine - It is safe to take Tylenol Ext Str 500mg  tabs - take 1 to 2 (max dose 1000mg ) every 6 hours as needed for breakthrough pain, max 24 hour daily dose is 6 to 8 tablets or 4000mg   If aleve isnt strong enough we can add a rx Prednisone steroid short term.  Recommend to start taking Tylenol Extra Strength 500mg  tabs - take 1 to 2 tabs per dose (max 1000mg ) every 6-8 hours for pain (take regularly, don't skip a dose for next 7 days), max 24 hour daily dose is 6 tablets or 3000mg . In the future you can repeat the same everyday Tylenol course for 1-2 weeks at a time.   If you prefer - we can refer to a Neurologist - for Nerve  Conduction Study and also consultation to discuss test results and treatment options  [facility] - Neurology Dept [address] [place],  Phone: [phone]  Consider steroid injection, other therapy and possibly referral to Orthopedic surgery for possible carpal tunnel release surgery as mentioned   Please schedule a Follow-up Appointment to: Return in about 3 months (around 02/13/2020) for Carpal Tunnel / Anxiety.  If you have any other questions or concerns, please feel free to call the office or send a message through MyChart. You may also schedule an earlier appointment if necessary.  Additionally, you may be receiving a survey about your experience at our office within a few days to 1 week by e-mail or mail. We value your feedback.  , DO [facility], 

## 2019-12-16 DIAGNOSIS — G5603 Carpal tunnel syndrome, bilateral upper limbs: Secondary | ICD-10-CM

## 2019-12-16 DIAGNOSIS — F419 Anxiety disorder, unspecified: Secondary | ICD-10-CM

## 2019-12-22 MED ORDER — BUSPIRONE HCL 10 MG PO TABS: 10.0000 mg | ORAL_TABLET | Freq: Three times a day (TID) | ORAL | 2 refills | Status: DC

## 2019-12-22 NOTE — Addendum Note (Signed)
Addended by: Smitty Cords on: 12/22/2019 08:16 AM   Modules accepted: Orders

## 2019-12-31 MED ORDER — ESCITALOPRAM OXALATE 5 MG PO TABS: 5.0000 mg | ORAL_TABLET | Freq: Every day | ORAL | 2 refills | Status: DC

## 2019-12-31 NOTE — Addendum Note (Signed)
Addended by: Smitty Cords on: 12/31/2019 06:14 PM   Modules accepted: Orders

## 2020-01-28 ENCOUNTER — Other Ambulatory Visit: Payer: Self-pay

## 2020-01-28 DIAGNOSIS — E291 Testicular hypofunction: Secondary | ICD-10-CM

## 2020-01-30 ENCOUNTER — Other Ambulatory Visit: Payer: Self-pay | Admitting: Family Medicine

## 2020-01-30 ENCOUNTER — Other Ambulatory Visit: Payer: 59

## 2020-01-30 ENCOUNTER — Other Ambulatory Visit: Payer: Self-pay

## 2020-01-30 DIAGNOSIS — K219 Gastro-esophageal reflux disease without esophagitis: Secondary | ICD-10-CM

## 2020-01-30 DIAGNOSIS — E291 Testicular hypofunction: Secondary | ICD-10-CM

## 2020-01-30 DIAGNOSIS — M79642 Pain in left hand: Secondary | ICD-10-CM | POA: Insufficient documentation

## 2020-01-31 LAB — TESTOSTERONE: Testosterone: 399 ng/dL (ref 264–916)

## 2020-01-31 LAB — HEMATOCRIT: Hematocrit: 49.9 % (ref 37.5–51.0)

## 2020-01-31 LAB — PSA: Prostate Specific Ag, Serum: 0.8 ng/mL (ref 0.0–4.0)

## 2020-02-02 ENCOUNTER — Telehealth: Payer: Self-pay | Admitting: *Deleted

## 2020-02-02 NOTE — Telephone Encounter (Signed)
Notified patient as instructed, patient pleased. Discussed follow-up appointments, patient agrees  

## 2020-02-02 NOTE — Telephone Encounter (Signed)
-----   Message from [name], MD sent at 02/01/2020  9:19 PM EDT ----- PSA stable 0.8, hematocrit normal 49.9, testosterone 399 Follow-up as scheduled

## 2020-02-04 DIAGNOSIS — F952 Tourette's disorder: Secondary | ICD-10-CM

## 2020-02-04 DIAGNOSIS — F419 Anxiety disorder, unspecified: Secondary | ICD-10-CM

## 2020-02-05 MED ORDER — ESCITALOPRAM OXALATE 10 MG PO TABS: 10.0000 mg | ORAL_TABLET | Freq: Every day | ORAL | 1 refills | Status: DC

## 2020-02-26 NOTE — Addendum Note (Signed)
Addended by: Smitty Cords on: 02/26/2020 04:27 PM   Modules accepted: Orders

## 2020-02-28 ENCOUNTER — Other Ambulatory Visit: Payer: Self-pay | Admitting: Family Medicine

## 2020-02-28 DIAGNOSIS — F419 Anxiety disorder, unspecified: Secondary | ICD-10-CM

## 2020-03-06 DIAGNOSIS — R200 Anesthesia of skin: Secondary | ICD-10-CM | POA: Insufficient documentation

## 2020-03-15 ENCOUNTER — Ambulatory Visit (INDEPENDENT_AMBULATORY_CARE_PROVIDER_SITE_OTHER): Payer: 59 | Admitting: Psychology

## 2020-03-15 DIAGNOSIS — F418 Other specified anxiety disorders: Secondary | ICD-10-CM

## 2020-04-05 ENCOUNTER — Ambulatory Visit (INDEPENDENT_AMBULATORY_CARE_PROVIDER_SITE_OTHER): Payer: 59 | Admitting: Psychology

## 2020-04-05 DIAGNOSIS — F418 Other specified anxiety disorders: Secondary | ICD-10-CM

## 2020-04-15 DIAGNOSIS — F419 Anxiety disorder, unspecified: Secondary | ICD-10-CM

## 2020-04-15 MED ORDER — ESCITALOPRAM OXALATE 20 MG PO TABS: 20.0000 mg | ORAL_TABLET | Freq: Every day | ORAL | 1 refills | Status: DC

## 2020-04-19 ENCOUNTER — Ambulatory Visit (INDEPENDENT_AMBULATORY_CARE_PROVIDER_SITE_OTHER): Payer: 59 | Admitting: Psychology

## 2020-04-19 DIAGNOSIS — F418 Other specified anxiety disorders: Secondary | ICD-10-CM | POA: Diagnosis not present

## 2020-04-23 ENCOUNTER — Ambulatory Visit (INDEPENDENT_AMBULATORY_CARE_PROVIDER_SITE_OTHER): Payer: 59 | Admitting: Family Medicine

## 2020-04-23 ENCOUNTER — Encounter: Payer: Self-pay | Admitting: Family Medicine

## 2020-04-23 ENCOUNTER — Other Ambulatory Visit: Payer: Self-pay

## 2020-04-23 VITALS — BP 119/73 | HR 94 | Temp 96.8°F | Resp 16 | Ht 66.0 in | Wt 235.0 lb

## 2020-04-23 DIAGNOSIS — G5603 Carpal tunnel syndrome, bilateral upper limbs: Secondary | ICD-10-CM

## 2020-04-23 DIAGNOSIS — R109 Unspecified abdominal pain: Secondary | ICD-10-CM

## 2020-04-23 DIAGNOSIS — F419 Anxiety disorder, unspecified: Secondary | ICD-10-CM

## 2020-04-23 DIAGNOSIS — K219 Gastro-esophageal reflux disease without esophagitis: Secondary | ICD-10-CM | POA: Diagnosis not present

## 2020-04-23 MED ORDER — DICYCLOMINE HCL 10 MG PO CAPS: 10.0000 mg | ORAL_CAPSULE | Freq: Three times a day (TID) | ORAL | 2 refills | Status: DC

## 2020-04-23 NOTE — Progress Notes (Signed)
Subjective:    Patient ID: Robert [name], male    DOB: [birth year], 39 y.o.   MRN: [id number]  Robert [name] is a 39 y.o. male presenting on 04/23/2020 for Abdominal Pain (onset 5 days--stabbing pain --sore to touch as per patient )   HPI   Abdominal Pain, generalized GERD Abdominal Cramping / Bloating Morbid Obesity BMI >37  Reports symptoms initially over past weekend almost 5-6 days with bilateral sides lower abdomen radiating up abdominal wall and chest. Described pain with movements turning twisting and sitting up was bothering him, described cramping pain as a dull pain. [place] Spaniel it was more sore to the touch. Says that he had no change in symptoms with eating / drinking did not trigger pain, he went to bland / clear diet with some relief. - Admits has tension headache recently took Tylenol - Overall today now has had dramatic improvement up to 90% improved with pain. He has seen Dr Servando Snare - Admits some gas and gurgling noises at time, will get heartburn indigestion. - Takes OTC Alka-seltzer + Simethicone temporary or limited relief  Updates  Generalized Anxiety Disorder Recently increased his Lexapro from 10 to 20mg  daily, doing well on this now Will see Dr in January 2022  Carpal Tunnel R>L bilateral, has done injections steroid only lasted temporarily, will do PT therapy, then future consider surgery.  Health Maintenance: UTD COVID vaccine 1st pfizer 04/09/20  Depression screen [facility] 2/9 11/13/2019 04/15/2019 02/28/2018  Decreased Interest 0 0 0  Down, Depressed, Hopeless 0 0 0  PHQ - 2 Score 0 0 0  Altered sleeping - - -  Tired, decreased energy - - -  Change in appetite - - -  Feeling bad or failure about yourself  - - -  Trouble concentrating - - -  Moving slowly or fidgety/restless - - -  Suicidal thoughts - - -  PHQ-9 Score - - -  Difficult doing work/chores - - -    Social History   Tobacco Use  . Smoking status: Former Smoker    Packs/day: 1.00     Years: 15.00    Pack years: 15.00    Types: Cigarettes    Quit date: 10/10/2016    Years since quitting: 3.5  . Smokeless tobacco: Former 12/10/2016  . Tobacco comment: Quit using vaping  Vaping Use  . Vaping Use: Former  . Quit date: 04/14/2018  Substance Use Topics  . Alcohol use: No  . Drug use: No    Review of Systems Per HPI unless specifically indicated above     Objective:    BP 119/73   Pulse 94   Temp (!) 96.8 F (36 C) (Temporal)   Resp 16   Ht 5\' 6"  (1.676 m)   Wt 235 lb (106.6 kg)   SpO2 98%   BMI 37.93 kg/m   Wt Readings from Last 3 Encounters:  04/23/20 235 lb (106.6 kg)  11/13/19 232 lb 9.6 oz (105.5 kg)  07/30/19 205 lb (93 kg)    Physical Exam Vitals and nursing note reviewed.  Constitutional:      General: He is not in acute distress.    Appearance: He is well-developed. He is not diaphoretic.     Comments: Well-appearing, comfortable, cooperative  HENT:     Head: Normocephalic and atraumatic.  Eyes:     General:        Right eye: No discharge.        Left eye: No discharge.  Conjunctiva/sclera: Conjunctivae normal.  Neck:     Thyroid: No thyromegaly.  Cardiovascular:     Rate and Rhythm: Normal rate.     Heart sounds: No murmur heard.   Pulmonary:     Effort: Pulmonary effort is normal. No respiratory distress.  Abdominal:     General: Bowel sounds are normal.     Palpations: Abdomen is soft.     Tenderness: There is no abdominal tenderness.  Musculoskeletal:        General: Normal range of motion.     Cervical back: Normal range of motion and neck supple.  Lymphadenopathy:     Cervical: No cervical adenopathy.  Skin:    General: Skin is warm and dry.     Findings: No erythema or rash.  Neurological:     Mental Status: He is alert and oriented to person, place, and time.  Psychiatric:        Behavior: Behavior normal.     Comments: Well groomed, good eye contact, normal speech and thoughts    Results for orders placed or  performed in visit on 01/30/20  PSA  Result Value Ref Range   Prostate Specific Ag, Serum 0.8 0.0 - 4.0 ng/mL  Hematocrit  Result Value Ref Range   Hematocrit 49.9 37.5 - 51.0 %  Testosterone  Result Value Ref Range   Testosterone 399 264 - 916 ng/dL      Assessment & Plan:   Problem List Items Addressed This Visit    Morbid obesity (HCC)   Laryngopharyngeal reflux (LPR)   GERD (gastroesophageal reflux disease)   Relevant Medications   dicyclomine (BENTYL) 10 MG capsule   Carpal tunnel syndrome on both sides   Anxiety    Other Visit Diagnoses    Abdominal cramping    -  Primary   Relevant Medications   dicyclomine (BENTYL) 10 MG capsule     #Abdominal pain/cramping Most consistent with functional GI symptoms, related to GERD, can be gas bloating indigestion Now mostly resolved Continue current PPI therapy Add Dicyclomine PRN meals / cramping symptoms Consider add Carafate in future if indicated   #Morbid Obesity BMI >37 with co morbid OSA, GERD, Elevated A1c Encourage lifestyle  #Anxiety Followed by Psych will see Dr Maryruth Bun in January On recently increased Escitalopram 20mg  - improved  #Carpal Tunnel, bilateral Upcoming PT program then consider repeat injection vs procedure per ortho    Meds ordered this encounter  Medications  . dicyclomine (BENTYL) 10 MG capsule    Sig: Take 1 capsule (10 mg total) by mouth 4 (four) times daily -  before meals and at bedtime. As needed for cramping.    Dispense:  30 capsule    Refill:  2      Follow up plan: Return if symptoms worsen or fail to improve.   , DO [facility] 04/23/2020, 2:53 PM

## 2020-04-23 NOTE — Patient Instructions (Addendum)
Thank you for coming to the office today.  Try the dicyclomine for cramping and symptoms, take with meal or AFTER for symptoms if develop May adjust rx if need  Let me know if want to try Carafate (Sucralfate) instead or in addition to it is a chalky soothing rx for usually acid or upset stomach indigestion  Future physical when ready   Please schedule a Follow-up Appointment to: Return if symptoms worsen or fail to improve.  If you have any other questions or concerns, please feel free to call the office or send a message through MyChart. You may also schedule an earlier appointment if necessary.  Additionally, you may be receiving a survey about your experience at our office within a few days to 1 week by e-mail or mail. We value your feedback.  Saralyn Pilar, DO [facility], [place]

## 2020-05-03 ENCOUNTER — Ambulatory Visit (INDEPENDENT_AMBULATORY_CARE_PROVIDER_SITE_OTHER): Payer: 59 | Admitting: Psychology

## 2020-05-03 DIAGNOSIS — F418 Other specified anxiety disorders: Secondary | ICD-10-CM

## 2020-05-19 ENCOUNTER — Other Ambulatory Visit: Payer: Self-pay | Admitting: Urology

## 2020-05-31 ENCOUNTER — Ambulatory Visit: Payer: 59 | Admitting: Psychology

## 2020-06-01 ENCOUNTER — Encounter: Payer: Self-pay | Admitting: Family Medicine

## 2020-06-01 ENCOUNTER — Telehealth (INDEPENDENT_AMBULATORY_CARE_PROVIDER_SITE_OTHER): Payer: 59 | Admitting: Family Medicine

## 2020-06-01 ENCOUNTER — Other Ambulatory Visit: Payer: Self-pay

## 2020-06-01 DIAGNOSIS — M62838 Other muscle spasm: Secondary | ICD-10-CM | POA: Diagnosis not present

## 2020-06-01 DIAGNOSIS — R197 Diarrhea, unspecified: Secondary | ICD-10-CM | POA: Diagnosis not present

## 2020-06-01 DIAGNOSIS — R112 Nausea with vomiting, unspecified: Secondary | ICD-10-CM | POA: Diagnosis not present

## 2020-06-01 MED ORDER — CYCLOBENZAPRINE HCL 10 MG PO TABS: 10.0000 mg | ORAL_TABLET | Freq: Three times a day (TID) | ORAL | 1 refills | Status: DC | PRN

## 2020-06-01 NOTE — Progress Notes (Signed)
Virtual Visit via Telephone The purpose of this virtual visit is to provide medical care while limiting exposure to the novel coronavirus (COVID19) for both patient and office staff.  Consent was obtained for phone visit:  Yes.   Answered questions that patient had about telehealth interaction:  Yes.   I discussed the limitations, risks, security and privacy concerns of performing an evaluation and management service by telephone. I also discussed with the patient that there may be a patient responsible charge related to this service. The patient expressed understanding and agreed to proceed.  Patient Location: Home Provider Location: [facility] (Office)  Participants in virtual visit: - Patient: Robert [name] - CMA: Elvina Mattes, CMA - Provider: Dr Althea Charon  ---------------------------------------------------------------------- Chief Complaint  Patient presents with  . Diarrhea    throwing up but subsided --no energy per patient, HA--onset 2 weeks    S: Reviewed CMA documentation. I have called patient and gathered additional HPI as follows:  Diarrhea / nausea vomiting / Headache / Low Energy Additional past history - last visit 04/23/20 for abdominal cramping, he was given Dicyclomine with good results PRN for abdominal cramping symptoms especially in PM.  Reports that symptoms started 2 weeks ago, with episodic symptoms with OTC supportive care regimen, he says now symptoms have improved overall, but still has some lingering episodes of GI intolerance. He was worried about gastroenteritis. Now nausea improved.  He was vaccinated against COVID x 2 doses in October and November 2021. Has not done any COVID testing recently with these symptoms.  Additionally admits muscle spasm and tension occipital muscles and occiput at base of skull - he takes NSAID regularly, asking about muscle relaxant   Denies any known or suspected exposure to person with or possibly  with COVID19.  Denies any fevers, chills, sweats, body ache, cough, shortness of breath, sinus pain or pressure  Past Medical History:  Diagnosis Date  . ADHD   . GERD (gastroesophageal reflux disease)   . Wears contact lenses    Social History   Tobacco Use  . Smoking status: Former Smoker    Packs/day: 1.00    Years: 15.00    Pack years: 15.00    Types: Cigarettes    Quit date: 10/10/2016    Years since quitting: 3.6  . Smokeless tobacco: Former Neurosurgeon  . Tobacco comment: Quit using vaping  Vaping Use  . Vaping Use: Former  . Quit date: 04/14/2018  Substance Use Topics  . Alcohol use: No  . Drug use: No    Current Outpatient Medications:  .  dicyclomine (BENTYL) 10 MG capsule, Take 1 capsule (10 mg total) by mouth 4 (four) times daily -  before meals and at bedtime. As needed for cramping., Disp: 30 capsule, Rfl: 2 .  escitalopram (LEXAPRO) 20 MG tablet, Take 1 tablet (20 mg total) by mouth daily., Disp: 90 tablet, Rfl: 1 .  NEEDLE, DISP, 18 G (BD DISP NEEDLES) 18G X 1-1/2" MISC, Use 18G to draw up Testosterone medication, Disp: 50 each, Rfl: 0 .  NEEDLE, DISP, 21 G (BD ECLIPSE NEEDLE) 21G X 1-1/2" MISC, Use 21G needle to inject Testosterone medication., Disp: 50 each, Rfl: 0 .  omeprazole (PRILOSEC) 40 MG capsule, TAKE 1 CAPSULE(40 MG) BY MOUTH DAILY BEFORE BREAKFAST, Disp: 90 capsule, Rfl: 1 .  PREVIDENT 5000 DRY MOUTH 1.1 % GEL dental gel, USE AS DIRECTED TO BRUSH TWICE DAILY, Disp: , Rfl:  .  Syringe, Disposable, (2-3CC SYRINGE) 3 ML MISC, Use  as directed, Disp: 100 each, Rfl: 0 .  testosterone cypionate (DEPOTESTOSTERONE CYPIONATE) 200 MG/ML injection, ADMINISTER 1 ML(200 MG) IN THE MUSCLE EVERY 14 DAYS, Disp: 4 mL, Rfl: 1 .  cyclobenzaprine (FLEXERIL) 10 MG tablet, Take 1 tablet (10 mg total) by mouth 3 (three) times daily as needed for muscle spasms., Disp: 30 tablet, Rfl: 1  Depression screen [facility] 2/9 11/13/2019 04/15/2019 02/28/2018  Decreased Interest 0 0 0  Down,  Depressed, Hopeless 0 0 0  PHQ - 2 Score 0 0 0  Altered sleeping - - -  Tired, decreased energy - - -  Change in appetite - - -  Feeling bad or failure about yourself  - - -  Trouble concentrating - - -  Moving slowly or fidgety/restless - - -  Suicidal thoughts - - -  PHQ-9 Score - - -  Difficult doing work/chores - - -    GAD 7 : Generalized Anxiety Score 11/13/2019 02/28/2018 11/01/2017 05/23/2017  Nervous, Anxious, on Edge 1 0 0 1  Control/stop worrying 0 0 0 0  Worry too much - different things 0 0 1 2  Trouble relaxing 1 0 1 3  Restless 1 0 0 3  Easily annoyed or irritable 0 0 0 0  Afraid - awful might happen 0 0 0 1  Total GAD 7 Score 3 0 2 10  Anxiety Difficulty Somewhat difficult Not difficult at all Not difficult at all Somewhat difficult    -------------------------------------------------------------------------- O: No physical exam performed due to remote telephone encounter.  Lab results reviewed.  No results found for this or any previous visit (from the past 2160 hour(s)).  -------------------------------------------------------------------------- A&P:  Problem List Items Addressed This Visit   None   Visit Diagnoses    Muscle spasms of neck    -  Primary   Relevant Medications   cyclobenzaprine (FLEXERIL) 10 MG tablet   Nausea and vomiting, intractability of vomiting not specified, unspecified vomiting type       Diarrhea, unspecified type         #GI gastroenteritis Clinically with viral gastroenteritis, however cannot rule out COVID with GI symptoms Now improving overall 2 weeks into it. Vaccinated recently COVID19 03/2020 / 04/2020, need 2nd date of covid vaccine Supportive care has helped Tolerating PO well now, overall improved Declines zofran or other rx at this time. Already has dicyclomine PRN Advised improve nutrition and continue supportive care May do COVID home rapid test if interested, could have been the answer.  #Muscle spasm,  neck Likely tension related and spasm given his description with everything going on now. Trial on Flexeril 5-10mg  TID PRN caution sedation Advise should avoid high dose NSAIDs will flare up problem #1  Meds ordered this encounter  Medications  . cyclobenzaprine (FLEXERIL) 10 MG tablet    Sig: Take 1 tablet (10 mg total) by mouth 3 (three) times daily as needed for muscle spasms.    Dispense:  30 tablet    Refill:  1    Follow-up: - Return as needed  Patient verbalizes understanding with the above medical recommendations including the limitation of remote medical advice.  Specific follow-up and call-back criteria were given for patient to follow-up or seek medical care more urgently if needed.   - Time spent in direct consultation with patient on phone: 11 minutes   Saralyn Pilar, DO [facility] 06/01/2020, 2:22 PM

## 2020-06-01 NOTE — Patient Instructions (Addendum)
#  GI gastroenteritis Clinically with viral gastroenteritis, however cannot rule out COVID with GI symptoms Now improving overall 2 weeks into it. Vaccinated recently COVID19 03/2020 / 04/2020, need 2nd date of covid vaccine Supportive care has helped Tolerating PO well now, overall improved Declines zofran or other rx at this time. Already has dicyclomine PRN Advised improve nutrition and continue supportive care May do COVID home rapid test if interested, could have been the answer.  #Muscle spasm, neck Likely tension related and spasm given his description with everything going on now. Trial on Flexeril 5-10mg  TID PRN caution sedation Advise should avoid high dose NSAIDs will flare up problem #1  Please schedule a Follow-up Appointment to: Return if symptoms worsen or fail to improve.  If you have any other questions or concerns, please feel free to call the office or send a message through MyChart. You may also schedule an earlier appointment if necessary.  Additionally, you may be receiving a survey about your experience at our office within a few days to 1 week by e-mail or mail. We value your feedback.  Saralyn Pilar, DO [facility], [place]

## 2020-06-21 ENCOUNTER — Ambulatory Visit (INDEPENDENT_AMBULATORY_CARE_PROVIDER_SITE_OTHER): Payer: PRIVATE HEALTH INSURANCE | Admitting: Psychology

## 2020-06-21 DIAGNOSIS — F418 Other specified anxiety disorders: Secondary | ICD-10-CM | POA: Diagnosis not present

## 2020-06-24 DIAGNOSIS — F411 Generalized anxiety disorder: Secondary | ICD-10-CM | POA: Diagnosis not present

## 2020-06-24 DIAGNOSIS — F5105 Insomnia due to other mental disorder: Secondary | ICD-10-CM | POA: Diagnosis not present

## 2020-07-07 DIAGNOSIS — F411 Generalized anxiety disorder: Secondary | ICD-10-CM | POA: Diagnosis not present

## 2020-07-07 DIAGNOSIS — F5105 Insomnia due to other mental disorder: Secondary | ICD-10-CM | POA: Diagnosis not present

## 2020-07-08 ENCOUNTER — Ambulatory Visit (INDEPENDENT_AMBULATORY_CARE_PROVIDER_SITE_OTHER): Payer: PRIVATE HEALTH INSURANCE | Admitting: Psychology

## 2020-07-08 DIAGNOSIS — F418 Other specified anxiety disorders: Secondary | ICD-10-CM | POA: Diagnosis not present

## 2020-07-14 ENCOUNTER — Ambulatory Visit (INDEPENDENT_AMBULATORY_CARE_PROVIDER_SITE_OTHER): Payer: PRIVATE HEALTH INSURANCE | Admitting: Psychology

## 2020-07-14 DIAGNOSIS — F418 Other specified anxiety disorders: Secondary | ICD-10-CM | POA: Diagnosis not present

## 2020-07-25 ENCOUNTER — Other Ambulatory Visit: Payer: Self-pay | Admitting: Family Medicine

## 2020-07-25 DIAGNOSIS — K219 Gastro-esophageal reflux disease without esophagitis: Secondary | ICD-10-CM

## 2020-07-26 ENCOUNTER — Encounter: Payer: Self-pay | Admitting: Occupational Therapy

## 2020-07-26 ENCOUNTER — Other Ambulatory Visit: Payer: Self-pay

## 2020-07-26 ENCOUNTER — Ambulatory Visit (INDEPENDENT_AMBULATORY_CARE_PROVIDER_SITE_OTHER): Payer: PRIVATE HEALTH INSURANCE | Admitting: Psychology

## 2020-07-26 ENCOUNTER — Ambulatory Visit: Payer: BC Managed Care – PPO | Attending: Orthopedic Surgery | Admitting: Occupational Therapy

## 2020-07-26 DIAGNOSIS — G5603 Carpal tunnel syndrome, bilateral upper limbs: Secondary | ICD-10-CM | POA: Diagnosis not present

## 2020-07-26 DIAGNOSIS — F418 Other specified anxiety disorders: Secondary | ICD-10-CM | POA: Diagnosis not present

## 2020-07-26 DIAGNOSIS — M6281 Muscle weakness (generalized): Secondary | ICD-10-CM | POA: Insufficient documentation

## 2020-07-26 DIAGNOSIS — R208 Other disturbances of skin sensation: Secondary | ICD-10-CM | POA: Insufficient documentation

## 2020-07-26 NOTE — Therapy (Signed)
[facility] [address], [place], [address] Phone: [phone]   Fax:  [phone]  Occupational Therapy Evaluation  Patient Details  Name: Robert [name] MRN: [id number] Date of Birth: September 10, 1980 Referring Provider (OT): Dr Rosita Kea   Encounter Date: 07/26/2020   OT End of Session - 07/26/20 1421    Visit Number 1    Number of Visits 8    Date for OT Re-Evaluation 08/23/20    OT Start Time 1107    OT Stop Time 1152    OT Time Calculation (min) 45 min    Activity Tolerance Patient tolerated treatment well           Past Medical History:  Diagnosis Date  . ADHD   . GERD (gastroesophageal reflux disease)   . Wears contact lenses     Past Surgical History:  Procedure Laterality Date  . ESOPHAGOGASTRODUODENOSCOPY (EGD) WITH PROPOFOL N/A 06/14/2017   Procedure: ESOPHAGOGASTRODUODENOSCOPY (EGD) WITH PROPOFOL;  Surgeon: Midge Minium, MD;  Location: [facility];  Service: Endoscopy;  Laterality: N/A;  . SEPTOPLASTY  06/13/2011  . WISDOM TOOTH EXTRACTION      There were no vitals filed for this visit.   Subjective Assessment - 07/26/20 1342    Subjective  Had symptoms for a long time but this year working from home my carpal tunnel got worse, numbness or pins and needles as soon as I use them for more than min and when in the morning - did get splints I am wearing but my hands go numb in them - had shots but did not help long    Limitations Pt had nerve conduction test for CTS with Dr Malvin Johns on 02/05/20, then refer to Dr Rosita Kea 03/10/20- had shot in L , and then in R CT on 03/26/20- pt cont to have CTS and refer to OT /hand therapy for CTS    Patient Stated Goals Want the numbness better so I can use my hands for working, yardwork and every day activities    Currently in Pain? 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, repair and work on Animator, games,      AROM   Right Wrist Extension 70 Degrees    Right Wrist Flexion 80 Degrees    Right Wrist Radial Deviation 18 Degrees    Right Wrist Ulnar Deviation 32 Degrees    Left Wrist Extension 70 Degrees    Left Wrist Flexion 78 Degrees    Left Wrist Radial Deviation 20 Degrees    Left Wrist Ulnar Deviation 38 Degrees      Strength   Right Hand Grip (lbs) 110    Right Hand Lateral Pinch 21 lbs    Right Hand 3 Point Pinch 15 lbs    Left Hand Grip (lbs) 115    Left Hand Lateral Pinch 25 lbs    Left Hand 3 Point Pinch 16 lbs  Right Hand AROM   R Thumb Opposition to Index --   2 cm off - very develop thenar eminence     Left Hand AROM   L Thumb Opposition to Index --   2cm off -very develop thenar eminence                   OT Treatments/Exercises (OP) - 07/26/20 0001      RUE Contrast Bath   Time 8 minutes    Comments prior to review of HEP      LUE Contrast Bath   Time 8 minutes    Comments prior to review of HEP         Pt to do   Contrast 2-3 x day Splint wearing night time- hard and on computer - wrist wrap- neoprene Benik splints fitted on pt for use on computer Massage volar forearm  Gentle stretches for wrist extention - 10 reps Tendon glides -AROM 8 reps Med N glide 5 reps       OT Education - 07/26/20 1420    Education Details findings of eval and HEP    Person(s) Educated Patient    Methods Explanation;Demonstration;Tactile cues;Verbal cues;Handout    Comprehension Verbal cues required;Returned demonstration;Verbalized understanding               OT Long Term Goals - 07/26/20 1542      OT LONG TERM GOAL #1    Title Pt to be independent in HEP to decrease symptoms in the am and able to do activities more than 10 min prior to sensory changes    Baseline Numbness in the morning, when on computer , using hand in any activity within 1-2 min numbness occur    Time 3    Period Weeks    Status New    Target Date 08/16/20      OT LONG TERM GOAL #2   Title Pt test negative for Tinel and Phalens test for CTS to wean out of daytime splint to less than 50% of day    Baseline Pt positive bilateral for phalens test and Tinel    Time 5    Period Weeks    Status New    Target Date 08/23/20                 Plan - 07/26/20 1422    Clinical Impression Statement Pt refer to OT with diagnosis of bilateral CTS - had nerve conduction test and bilateral shots end of 2021- but symptoms returned in few wks per pt - pt positive for Phalan's and Tinel - numbness in medial N distribution in the am , and on computer and if trying to use hands- pt tight in volar wrist and forearm - and very muscular in bilateral thenar eminence and webspace - AROM WNL and grip - Prehension in lower range for his age - pt can benefit from OT services to decrease symptoms and modifications to tasks    OT Occupational Profile and History Problem Focused Assessment - Including review of records relating to presenting problem    Occupational performance deficits (Please refer to evaluation for details): ADL's;IADL's;Rest and Sleep;Work;Play;Leisure    Body Structure / Function / Physical Skills ADL;UE functional use;Sensation;IADL;Proprioception;Strength;Decreased knowledge of precautions    Rehab Potential Fair    Clinical Decision Making Limited treatment options, no task modification necessary    Comorbidities Affecting Occupational Performance: --   work from home on computer   Modification or Assistance  to Complete Evaluation  No modification of tasks or assist necessary to complete eval    OT Frequency 2x / week    OT Duration 4 weeks     OT Treatment/Interventions Self-care/ADL training;Iontophoresis;Contrast Bath;Ultrasound;DME and/or AE instruction;Manual Therapy;Splinting;Patient/family education;Dry needling;Therapeutic exercise    Consulted and Agree with Plan of Care Patient           Patient will benefit from skilled therapeutic intervention in order to improve the following deficits and impairments:   Body Structure / Function / Physical Skills: ADL,UE functional use,Sensation,IADL,Proprioception,Strength,Decreased knowledge of precautions       Visit Diagnosis: Carpal tunnel syndrome, bilateral upper limbs - Plan: Ot plan of care cert/re-cert  Other disturbances of skin sensation - Plan: Ot plan of care cert/re-cert  Muscle weakness (generalized) - Plan: Ot plan of care cert/re-cert    Problem List Patient Active Problem List   Diagnosis Date Noted  . Carpal tunnel syndrome on both sides 11/13/2019  . OSA (obstructive sleep apnea) 04/15/2019  . Morbid obesity (HCC) 02/28/2018  . Elevated hemoglobin A1c 02/25/2018  . Dysphagia 02/19/2018  . Hoffman's reflex positive 02/19/2018  . Increased tendon reflexes 02/19/2018  . Dizziness 02/14/2018  . Tinnitus, bilateral 01/10/2018  . Anxiety 11/01/2017  . Heartburn   . Other specified diseases of esophagus   . History of vertigo 05/24/2017  . GERD (gastroesophageal reflux disease) 04/12/2017  . Laryngopharyngeal reflux (LPR) 04/12/2017  . S/P nasal septoplasty 04/12/2017  . Seasonal allergies 04/12/2017  . Allergic rhinitis 04/12/2017    Oletta Cohn OTR/L,CLT 07/26/2020, 3:49 PM  [facility] [address] [place], [address] Phone: [phone]   Fax:  [phone]  Name: Clement Deneault MRN: [id number] Date of Birth: [birth year]

## 2020-07-26 NOTE — Patient Instructions (Signed)
Contrast 2-3 x day Splint wearing night time- hard and on computer - wrist wrap Massage volar forearm  Gentle stretches for wrist extention - 10 reps Tendon glides -AROM 8 reps Med N glide 5 reps

## 2020-07-27 ENCOUNTER — Other Ambulatory Visit: Payer: Self-pay | Admitting: Family Medicine

## 2020-07-27 DIAGNOSIS — E291 Testicular hypofunction: Secondary | ICD-10-CM

## 2020-07-28 ENCOUNTER — Other Ambulatory Visit: Payer: BC Managed Care – PPO

## 2020-07-28 ENCOUNTER — Other Ambulatory Visit: Payer: Self-pay

## 2020-07-28 DIAGNOSIS — E291 Testicular hypofunction: Secondary | ICD-10-CM | POA: Diagnosis not present

## 2020-07-29 LAB — HEMATOCRIT: Hematocrit: 51.9 % — ABNORMAL HIGH (ref 37.5–51.0)

## 2020-07-29 LAB — TESTOSTERONE: Testosterone: 474 ng/dL (ref 264–916)

## 2020-07-30 ENCOUNTER — Ambulatory Visit: Payer: Self-pay | Admitting: Urology

## 2020-07-30 ENCOUNTER — Encounter: Payer: Self-pay | Admitting: Urology

## 2020-08-05 ENCOUNTER — Ambulatory Visit: Payer: BC Managed Care – PPO | Admitting: Occupational Therapy

## 2020-08-05 ENCOUNTER — Other Ambulatory Visit: Payer: Self-pay

## 2020-08-05 DIAGNOSIS — G5603 Carpal tunnel syndrome, bilateral upper limbs: Secondary | ICD-10-CM | POA: Diagnosis not present

## 2020-08-05 DIAGNOSIS — R208 Other disturbances of skin sensation: Secondary | ICD-10-CM | POA: Diagnosis not present

## 2020-08-05 DIAGNOSIS — M6281 Muscle weakness (generalized): Secondary | ICD-10-CM

## 2020-08-05 NOTE — Therapy (Signed)
[facility] [address], [place], [address] Phone: [phone]   Fax:  [phone]  Occupational Therapy Treatment  Patient Details  Name: Robert [name] MRN: [id number] Date of Birth: [birth year] Referring Provider (OT): Dr Rosita Kea   Encounter Date: 08/05/2020   OT End of Session - 08/05/20 1100    Visit Number 2    Number of Visits 8    Date for OT Re-Evaluation 08/23/20    OT Start Time 1100    OT Stop Time 1145    OT Time Calculation (min) 45 min    Activity Tolerance Patient tolerated treatment well    Behavior During Therapy [facility] for tasks assessed/performed           Past Medical History:  Diagnosis Date  . ADHD   . GERD (gastroesophageal reflux disease)   . Wears contact lenses     Past Surgical History:  Procedure Laterality Date  . ESOPHAGOGASTRODUODENOSCOPY (EGD) WITH PROPOFOL N/A 06/14/2017   Procedure: ESOPHAGOGASTRODUODENOSCOPY (EGD) WITH PROPOFOL;  Surgeon: Midge Minium, MD;  Location: [facility];  Service: Endoscopy;  Laterality: N/A;  . SEPTOPLASTY  06/13/2011  . WISDOM TOOTH EXTRACTION      There were no vitals filed for this visit.   Subjective Assessment - 08/05/20 1059    Subjective  I doing the massage , got a graston massage tool. Numbness about same - time period of numbness coming on about same- and forearm feels about the same doing the hot and cold and massage    Limitations Pt had nerve conduction test for CTS with Dr Malvin Johns on 02/05/20, then refer to Dr Rosita Kea 03/10/20- had shot in L , and then in R CT on 03/26/20- pt cont to have CTS and refer to OT /hand therapy for CTS    Patient Stated Goals Want the numbness better so I can use my hands for working, yardwork and every day activities    Currently in Pain? No/denies               Wrist extention coming in 75 R and L - decrease flexion still -discomfort volar wrist         OT Treatments/Exercises  (OP) - 08/05/20 0001      RUE Contrast Bath   Time 8 minutes    Comments prior to soft tissue and ROM      LUE Contrast Bath   Time 8 minutes    Comments prior to soft tissue and ROM          Graston tool nr 2 and 4 done on volar more than dorsal - but radial too - sweeping  And webspace of thumb  And MC spreads    Pt to cont with  Contrast 2-3 x day at home Splint wearing night time- hard and on computer - wrist wrap- neoprene Benik splints fitted last time to use  on computer Massage volar forearm - pt was using graston tool -but that involve tight and composite grip - to use palm , ask somebody else , or get massage balls to roll over with palm   Gentle stretches for wrist extention - 10 reps with elbows to side  But then review and can do at home on wall composite extention stretch R better than L -slight pull -pt felt pull into L 3rd digit -5 reps - hold 5 sec -no pressure thru palm After manual pt had very little tightness on volar  side and PROM was 90 degrees - R better than L Composite N glide done - 5  Reps And pt to do wall stretch only 5 x 5 sec  Tendon glides -AROM 8 reps Med N glide 5 reps        OT Education - 08/05/20 1100    Education Details progressand HEP    Person(s) Educated Patient    Methods Explanation;Demonstration;Tactile cues;Verbal cues;Handout    Comprehension Verbal cues required;Returned demonstration;Verbalized understanding               OT Long Term Goals - 07/26/20 1542      OT LONG TERM GOAL #1   Title Pt to be independent in HEP to decrease symptoms in the am and able to do activities more than 10 min prior to sensory changes    Baseline Numbness in the morning, when on computer , using hand in any activity within 1-2 min numbness occur    Time 3    Period Weeks    Status New    Target Date 08/16/20      OT LONG TERM GOAL #2   Title Pt test negative for Tinel and Phalens test for CTS to wean out of daytime splint to less  than 50% of day    Baseline Pt positive bilateral for phalens test and Tinel    Time 5    Period Weeks    Status New    Target Date 08/23/20                 Plan - 08/05/20 1100    Clinical Impression Statement Pt responded great on contrast and graston tool on volar forearm and wrist - R better than L - and able to get easy composite extenion on R more than L - add composite extention stretch on wall and nerve glide to pt's HEP -and review again modifications or tasks that will aggrivate CTS - cont with splint wearing    OT Occupational Profile and History Problem Focused Assessment - Including review of records relating to presenting problem    Occupational performance deficits (Please refer to evaluation for details): ADL's;IADL's;Rest and Sleep;Work;Play;Leisure    Body Structure / Function / Physical Skills ADL;UE functional use;Sensation;IADL;Proprioception;Strength;Decreased knowledge of precautions    Rehab Potential Fair    Clinical Decision Making Limited treatment options, no task modification necessary    Modification or Assistance to Complete Evaluation  No modification of tasks or assist necessary to complete eval    OT Frequency 2x / week    OT Duration 4 weeks    OT Treatment/Interventions Self-care/ADL training;Iontophoresis;Contrast Bath;Ultrasound;DME and/or AE instruction;Manual Therapy;Splinting;Patient/family education;Dry needling;Therapeutic exercise    Consulted and Agree with Plan of Care Patient           Patient will benefit from skilled therapeutic intervention in order to improve the following deficits and impairments:   Body Structure / Function / Physical Skills: ADL,UE functional use,Sensation,IADL,Proprioception,Strength,Decreased knowledge of precautions       Visit Diagnosis: Carpal tunnel syndrome, bilateral upper limbs  Other disturbances of skin sensation  Muscle weakness (generalized)    Problem List Patient Active Problem List    Diagnosis Date Noted  . Carpal tunnel syndrome on both sides 11/13/2019  . OSA (obstructive sleep apnea) 04/15/2019  . Morbid obesity (HCC) 02/28/2018  . Elevated hemoglobin A1c 02/25/2018  . Dysphagia 02/19/2018  . Hoffman's reflex positive 02/19/2018  . Increased tendon reflexes 02/19/2018  . Dizziness 02/14/2018  . Tinnitus,  bilateral 01/10/2018  . Anxiety 11/01/2017  . Heartburn   . Other specified diseases of esophagus   . History of vertigo 05/24/2017  . GERD (gastroesophageal reflux disease) 04/12/2017  . Laryngopharyngeal reflux (LPR) 04/12/2017  . S/P nasal septoplasty 04/12/2017  . Seasonal allergies 04/12/2017  . Allergic rhinitis 04/12/2017    Oletta Cohn OTR/L,CLT 08/05/2020, 12:25 PM  [facility] [address] [place], [address] Phone: [phone]   Fax:  [phone]  Name: Nasir Bright MRN: [id number] Date of Birth: Sep 15, 1980

## 2020-08-09 ENCOUNTER — Ambulatory Visit: Payer: BC Managed Care – PPO | Admitting: Psychology

## 2020-08-10 ENCOUNTER — Encounter: Payer: BC Managed Care – PPO | Admitting: Occupational Therapy

## 2020-08-13 ENCOUNTER — Ambulatory Visit: Payer: BC Managed Care – PPO | Attending: Orthopedic Surgery | Admitting: Occupational Therapy

## 2020-08-13 ENCOUNTER — Other Ambulatory Visit: Payer: Self-pay

## 2020-08-13 DIAGNOSIS — R208 Other disturbances of skin sensation: Secondary | ICD-10-CM | POA: Diagnosis not present

## 2020-08-13 DIAGNOSIS — G5603 Carpal tunnel syndrome, bilateral upper limbs: Secondary | ICD-10-CM | POA: Diagnosis not present

## 2020-08-13 DIAGNOSIS — M6281 Muscle weakness (generalized): Secondary | ICD-10-CM | POA: Diagnosis not present

## 2020-08-13 NOTE — Therapy (Signed)
[place] [facility] [address] [place], [address] Phone: [phone]   Fax:  [phone]  Occupational Therapy Treatment  Patient Details  Name: Robert [name] MRN: [id number] Date of Birth: 1981/06/08 Referring Provider (OT): Dr Rosita Kea   Encounter Date: 08/13/2020   OT End of Session - 08/13/20 1417    Visit Number 3    Number of Visits 8    Date for OT Re-Evaluation 08/23/20    OT Start Time 1135    OT Stop Time 1216    OT Time Calculation (min) 41 min    Activity Tolerance Patient tolerated treatment well    Behavior During Therapy [facility] for tasks assessed/performed           Past Medical History:  Diagnosis Date  . ADHD   . GERD (gastroesophageal reflux disease)   . Wears contact lenses     Past Surgical History:  Procedure Laterality Date  . ESOPHAGOGASTRODUODENOSCOPY (EGD) WITH PROPOFOL N/A 06/14/2017   Procedure: ESOPHAGOGASTRODUODENOSCOPY (EGD) WITH PROPOFOL;  Surgeon: Midge Minium, MD;  Location: [facility];  Service: Endoscopy;  Laterality: N/A;  . SEPTOPLASTY  06/13/2011  . WISDOM TOOTH EXTRACTION      There were no vitals filed for this visit.   Subjective Assessment - 08/13/20 1416    Subjective  I was this week in [facility] for family funeral and was not to much on computer -but did not do my exercise as much - the massage - did do the stretches    Limitations Pt had nerve conduction test for CTS with Dr Malvin Johns on 02/05/20, then refer to Dr Rosita Kea 03/10/20- had shot in L , and then in R CT on 03/26/20- pt cont to have CTS and refer to OT /hand therapy for CTS    Patient Stated Goals Want the numbness better so I can use my hands for working, yardwork and every day activities    Currently in Pain? No/denies              [facility] OT Assessment - 08/13/20 0001      AROM   Right Wrist Extension 75 Degrees    Right Wrist Flexion 80 Degrees    Left Wrist Extension 80 Degrees    Left Wrist  Flexion 85 Degrees           Pt show increase PROM and AROM in wrist L more than R - decrease tightness in volar forearms  Negative Tinel over L CT  R postive Tinel          OT Treatments/Exercises (OP) - 08/13/20 0001      RUE Contrast Bath   Time 8 minutes    Comments prior to soft tissue      LUE Contrast Bath   Time 8 minutes    Comments prior soft tissue           Graston tool nr 2 and 4 done on volar more than dorsal - but radial too - sweeping  And webspace of thumb  And MC spreads    Pt to cont with Contrast 2-3 x day at home Splint wearing night time- hard and on computer - wrist wrap- neoprene Benik splints fitted last time to use  on computer Massage volar forearm - pt was using graston tool -but that involve tight and composite grip - to use palm , ask somebody else , or get massage balls to roll over with palm  Gentle stretches for wrist extention - 10 reps with elbows to side  Review and can do at home on wall composite extention stretch L better than R  -slight pull -pt felt pull into L 3rd digit -5 reps - hold 5 sec -no pressure thru palm and pain or stretch should be less than 2/10 After manual pt had very little tightness on volar side and tolerate wall stretch much better  Composite N glide done - 5  Reps and review again  And pt to do wall stretch only 5 x 5 sec  Tendon glides -AROM 8 reps Med N glide 5 reps        OT Education - 08/13/20 1417    Education Details progressand HEP    Person(s) Educated Patient    Methods Explanation;Demonstration;Tactile cues;Verbal cues;Handout    Comprehension Verbal cues required;Returned demonstration;Verbalized understanding               OT Long Term Goals - 07/26/20 1542      OT LONG TERM GOAL #1   Title Pt to be independent in HEP to decrease symptoms in the am and able to do activities more than 10 min prior to sensory changes    Baseline Numbness in the morning, when on computer , using  hand in any activity within 1-2 min numbness occur    Time 3    Period Weeks    Status New    Target Date 08/16/20      OT LONG TERM GOAL #2   Title Pt test negative for Tinel and Phalens test for CTS to wean out of daytime splint to less than 50% of day    Baseline Pt positive bilateral for phalens test and Tinel    Time 5    Period Weeks    Status New    Target Date 08/23/20                 Plan - 08/13/20 1418    Clinical Impression Statement Pt show this date decrease tightness in L more than R forearm flexors - increase wrist flexion and extentoin - positive Tinel only on R -but report cont symptoms of CTS - "hands going to sleep" - review again composite stretches to forearms and to keep pain under 2/10 and add this date composite nerve stretch    OT Occupational Profile and History Problem Focused Assessment - Including review of records relating to presenting problem    Occupational performance deficits (Please refer to evaluation for details): ADL's;IADL's;Rest and Sleep;Work;Play;Leisure    Body Structure / Function / Physical Skills ADL;UE functional use;Sensation;IADL;Proprioception;Strength;Decreased knowledge of precautions    Rehab Potential Fair    Clinical Decision Making Limited treatment options, no task modification necessary    Comorbidities Affecting Occupational Performance: None   work on computers   Modification or Assistance to Complete Evaluation  No modification of tasks or assist necessary to complete eval    OT Frequency 2x / week    OT Duration 4 weeks    OT Treatment/Interventions Self-care/ADL training;Iontophoresis;Contrast Bath;Ultrasound;DME and/or AE instruction;Manual Therapy;Splinting;Patient/family education;Dry needling;Therapeutic exercise    Consulted and Agree with Plan of Care Patient           Patient will benefit from skilled therapeutic intervention in order to improve the following deficits and impairments:   Body Structure /  Function / Physical Skills: ADL,UE functional use,Sensation,IADL,Proprioception,Strength,Decreased knowledge of precautions       Visit Diagnosis: Carpal tunnel syndrome, bilateral upper  limbs  Other disturbances of skin sensation  Muscle weakness (generalized)    Problem List Patient Active Problem List   Diagnosis Date Noted  . Carpal tunnel syndrome on both sides 11/13/2019  . OSA (obstructive sleep apnea) 04/15/2019  . Morbid obesity (HCC) 02/28/2018  . Elevated hemoglobin A1c 02/25/2018  . Dysphagia 02/19/2018  . Hoffman's reflex positive 02/19/2018  . Increased tendon reflexes 02/19/2018  . Dizziness 02/14/2018  . Tinnitus, bilateral 01/10/2018  . Anxiety 11/01/2017  . Heartburn   . Other specified diseases of esophagus   . History of vertigo 05/24/2017  . GERD (gastroesophageal reflux disease) 04/12/2017  . Laryngopharyngeal reflux (LPR) 04/12/2017  . S/P nasal septoplasty 04/12/2017  . Seasonal allergies 04/12/2017  . Allergic rhinitis 04/12/2017    Oletta Cohn OTR/L,CLT 08/13/2020, 2:23 PM  [facility] [address] [place], [address] Phone: [phone]   Fax:  [phone]  Name: Timm Bonenberger MRN: [id number] Date of Birth: 01-30-1981

## 2020-08-17 ENCOUNTER — Ambulatory Visit: Payer: BC Managed Care – PPO | Admitting: Occupational Therapy

## 2020-08-19 ENCOUNTER — Other Ambulatory Visit: Payer: Self-pay

## 2020-08-19 ENCOUNTER — Ambulatory Visit: Payer: BC Managed Care – PPO | Admitting: Occupational Therapy

## 2020-08-19 DIAGNOSIS — G5603 Carpal tunnel syndrome, bilateral upper limbs: Secondary | ICD-10-CM

## 2020-08-19 DIAGNOSIS — M6281 Muscle weakness (generalized): Secondary | ICD-10-CM

## 2020-08-19 DIAGNOSIS — R208 Other disturbances of skin sensation: Secondary | ICD-10-CM

## 2020-08-19 NOTE — Therapy (Signed)
[facility] [address] [place], [address] Phone: [phone]   Fax:  [phone]  Occupational Therapy Treatment  Patient Details  Name: Robert [name] MRN: [id number] Date of Birth: [birth year] Referring Provider (OT): Dr Rosita Kea   Encounter Date: 08/19/2020   OT End of Session - 08/19/20 0816    Visit Number 4    Number of Visits 8    Date for OT Re-Evaluation 08/23/20    OT Start Time 0816    OT Stop Time 0850    OT Time Calculation (min) 34 min    Activity Tolerance Patient tolerated treatment well    Behavior During Therapy [facility] for tasks assessed/performed           Past Medical History:  Diagnosis Date  . ADHD   . GERD (gastroesophageal reflux disease)   . Wears contact lenses     Past Surgical History:  Procedure Laterality Date  . ESOPHAGOGASTRODUODENOSCOPY (EGD) WITH PROPOFOL N/A 06/14/2017   Procedure: ESOPHAGOGASTRODUODENOSCOPY (EGD) WITH PROPOFOL;  Surgeon: Midge Minium, MD;  Location: [facility];  Service: Endoscopy;  Laterality: N/A;  . SEPTOPLASTY  06/13/2011  . WISDOM TOOTH EXTRACTION      There were no vitals filed for this visit.   Subjective Assessment - 08/19/20 0816    Subjective  Tight this morning  -Numbness still happens during night , holding phone, - probably about the same amount of times - 4 x day    Limitations Pt had nerve conduction test for CTS with Dr Malvin Johns on 02/05/20, then refer to Dr Rosita Kea 03/10/20- had shot in L , and then in R CT on 03/26/20- pt cont to have CTS and refer to OT /hand therapy for CTS    Patient Stated Goals Want the numbness better so I can use my hands for working, yardwork and every day activities    Currently in Pain? No/denies              [facility] OT Assessment - 08/19/20 0001      AROM   Right Wrist Extension 75 Degrees    Right Wrist Flexion 80 Degrees    Left Wrist Extension 80 Degrees    Left Wrist Flexion 85 Degrees       Strength   Right Hand Grip (lbs) 110    Right Hand Lateral Pinch 26 lbs    Right Hand 3 Point Pinch 18 lbs    Left Hand Grip (lbs) 115    Left Hand Lateral Pinch 26 lbs    Left Hand 3 Point Pinch 18 lbs         good progress in wrist extention Great improvement in prehension strength - also now  WNL like grip for age     Pt report tightness coming in - this am - but because of am - PROM felt same - much improve since [facility]        OT Treatments/Exercises (OP) - 08/19/20 0001      RUE Contrast Bath   Time 8 minutes    Comments prior to soft tisseu and ROM      LUE Contrast Bath   Time 8 minutes    Comments prior to soft tissue and ROM             Graston tool nr 2 and 4 done on volar more than dorsal - but radial too - sweeping  And webspace of thumb And MC spreads  Pt to cont withContrast 2-3 x dayat home Splint wearing increase to all time time- except with HEP and ADL"s  Cont with wrist wraps -  neoprene Benik splints  to useon computer Massage volar forearm- pt was using graston tool -but that involve tight and composite grip - to use palm , ask somebody else , or get massage balls to roll over with palm  Gentle stretches for wrist extention - 10 reps- great improvement in tightness Review and can do at home on wall composite extention stretch bilaterally   -less of pull this date after soft tissue and contrast  slight pull -5 reps - hold 5 sec -no pressure thru palm and pain or stretch should be less than 2/10 After manual pt had very little tightness on volar side and tolerate wall stretch much better  Composite N glide done - 5 Reps and review again  And pt to do wall stretch only 5 x 5 sec Tendon glides -AROM 8 reps Med N glide 5 reps       OT Education - 08/19/20 0816    Education Details progressand HEP    Person(s) Educated Patient    Methods Explanation;Demonstration;Tactile cues;Verbal cues;Handout    Comprehension Verbal cues  required;Returned demonstration;Verbalized understanding               OT Long Term Goals - 07/26/20 1542      OT LONG TERM GOAL #1   Title Pt to be independent in HEP to decrease symptoms in the am and able to do activities more than 10 min prior to sensory changes    Baseline Numbness in the morning, when on computer , using hand in any activity within 1-2 min numbness occur    Time 3    Period Weeks    Status New    Target Date 08/16/20      OT LONG TERM GOAL #2   Title Pt test negative for Tinel and Phalens test for CTS to wean out of daytime splint to less than 50% of day    Baseline Pt positive bilateral for phalens test and Tinel    Time 5    Period Weeks    Status New    Target Date 08/23/20                 Plan - 08/19/20 0827    Clinical Impression Statement Pt seen for 4 visits made great progress in AROM , tightness on volar wrist and forearm - and increase prehension strength- pt report no numbness on computer - but do have numbness about the same amount of time during day and night time - wearing splints but night time mostly and some actiivities - pt to call Dr Rosita Kea for appt - but in meantime wear splints as much as he can - off for ADL's only and can follow up with me in 10 days if not seen Dr Rosita Kea yet    OT Occupational Profile and History Problem Focused Assessment - Including review of records relating to presenting problem    Occupational performance deficits (Please refer to evaluation for details): ADL's;IADL's;Rest and Sleep;Work;Play;Leisure    Body Structure / Function / Physical Skills ADL;UE functional use;Sensation;IADL;Proprioception;Strength;Decreased knowledge of precautions    Rehab Potential Fair    Clinical Decision Making Limited treatment options, no task modification necessary    Comorbidities Affecting Occupational Performance: None    Modification or Assistance to Complete Evaluation  No modification of tasks or assist necessary  to  complete eval    OT Frequency 1x / week    OT Duration 2 weeks    OT Treatment/Interventions Self-care/ADL training;Iontophoresis;Contrast Bath;Ultrasound;DME and/or AE instruction;Manual Therapy;Splinting;Patient/family education;Dry needling;Therapeutic exercise    Consulted and Agree with Plan of Care Patient           Patient will benefit from skilled therapeutic intervention in order to improve the following deficits and impairments:   Body Structure / Function / Physical Skills: ADL,UE functional use,Sensation,IADL,Proprioception,Strength,Decreased knowledge of precautions       Visit Diagnosis: Carpal tunnel syndrome, bilateral upper limbs  Other disturbances of skin sensation  Muscle weakness (generalized)    Problem List Patient Active Problem List   Diagnosis Date Noted  . Carpal tunnel syndrome on both sides 11/13/2019  . OSA (obstructive sleep apnea) 04/15/2019  . Morbid obesity (HCC) 02/28/2018  . Elevated hemoglobin A1c 02/25/2018  . Dysphagia 02/19/2018  . Hoffman's reflex positive 02/19/2018  . Increased tendon reflexes 02/19/2018  . Dizziness 02/14/2018  . Tinnitus, bilateral 01/10/2018  . Anxiety 11/01/2017  . Heartburn   . Other specified diseases of esophagus   . History of vertigo 05/24/2017  . GERD (gastroesophageal reflux disease) 04/12/2017  . Laryngopharyngeal reflux (LPR) 04/12/2017  . S/P nasal septoplasty 04/12/2017  . Seasonal allergies 04/12/2017  . Allergic rhinitis 04/12/2017    Oletta Cohn OTR/L,CLT 08/19/2020, 8:55 AM   [facility] [address], [place], [address] Phone: [phone]   Fax:  [phone]  Name: Larenzo Caples MRN: [id number] Date of Birth: [birth year]

## 2020-08-20 ENCOUNTER — Ambulatory Visit (INDEPENDENT_AMBULATORY_CARE_PROVIDER_SITE_OTHER): Payer: PRIVATE HEALTH INSURANCE | Admitting: Psychology

## 2020-08-20 DIAGNOSIS — F418 Other specified anxiety disorders: Secondary | ICD-10-CM

## 2020-08-23 ENCOUNTER — Ambulatory Visit (INDEPENDENT_AMBULATORY_CARE_PROVIDER_SITE_OTHER): Payer: PRIVATE HEALTH INSURANCE | Admitting: Psychology

## 2020-08-23 DIAGNOSIS — F418 Other specified anxiety disorders: Secondary | ICD-10-CM | POA: Diagnosis not present

## 2020-08-30 ENCOUNTER — Ambulatory Visit: Payer: BC Managed Care – PPO | Admitting: Occupational Therapy

## 2020-08-30 ENCOUNTER — Other Ambulatory Visit: Payer: Self-pay

## 2020-08-30 DIAGNOSIS — G5603 Carpal tunnel syndrome, bilateral upper limbs: Secondary | ICD-10-CM | POA: Diagnosis not present

## 2020-08-30 DIAGNOSIS — R208 Other disturbances of skin sensation: Secondary | ICD-10-CM | POA: Diagnosis not present

## 2020-08-30 DIAGNOSIS — M6281 Muscle weakness (generalized): Secondary | ICD-10-CM

## 2020-08-30 NOTE — Therapy (Signed)
[place] [facility] [address], [place], [address] Phone: [phone]   Fax:  [phone]  Occupational Therapy Treatment  Patient Details  Name: Robert [name] MRN: [id number] Date of Birth: Mar 27, 1981 Referring Provider (OT): Dr Rudene Christians   Encounter Date: 08/30/2020   OT End of Session - 08/30/20 1345    Visit Number 5    Number of Visits 8    Date for OT Re-Evaluation 08/30/20    OT Start Time 1301    OT Stop Time 1340    OT Time Calculation (min) 39 min    Activity Tolerance Patient tolerated treatment well    Behavior During Therapy [facility] for tasks assessed/performed           Past Medical History:  Diagnosis Date  . ADHD   . GERD (gastroesophageal reflux disease)   . Wears contact lenses     Past Surgical History:  Procedure Laterality Date  . ESOPHAGOGASTRODUODENOSCOPY (EGD) WITH PROPOFOL N/A 06/14/2017   Procedure: ESOPHAGOGASTRODUODENOSCOPY (EGD) WITH PROPOFOL;  Surgeon: Lucilla Lame, MD;  Location: [place];  Service: Endoscopy;  Laterality: N/A;  . SEPTOPLASTY  06/13/2011  . WISDOM TOOTH EXTRACTION      There were no vitals filed for this visit.   Subjective Assessment - 08/30/20 1343    Subjective  I can tell tightness is better, the carpal tunnel numbness better - stil feel the pinkie and ring finger go numb- but then I see I am bending my elbow- my arms or forearm from my elbow downs feel still weak or tired at times - my neck has been more tight or stiff the last few months , year- had massage about 2 wks ago    Limitations Pt had nerve conduction test for CTS with Dr Melrose Nakayama on 02/05/20, then refer to Dr Rudene Christians 03/10/20- had shot in L , and then in R CT on 03/26/20- pt cont to have CTS and refer to OT /hand therapy for CTS    Patient Stated Goals Want the numbness better so I can use my hands for working, yardwork and every day activities    Currently in Pain? No/denies               [facility] OT Assessment - 08/30/20 0001      AROM   Right Wrist Extension 80 Degrees    Right Wrist Flexion 80 Degrees    Right Wrist Radial Deviation 20 Degrees    Right Wrist Ulnar Deviation 32 Degrees    Left Wrist Extension 80 Degrees    Left Wrist Flexion 85 Degrees    Left Wrist Radial Deviation 20 Degrees    Left Wrist Ulnar Deviation 38 Degrees      Strength   Right Hand Grip (lbs) 115    Right Hand Lateral Pinch 27 lbs    Right Hand 3 Point Pinch 20 lbs    Left Hand Grip (lbs) 112    Left Hand Lateral Pinch 26 lbs    Left Hand 3 Point Pinch 20 lbs           Report weak or heavy feeling in bilateral arms from elbow's down- admit neck more stiff the last few months or since working from home Desk or table to high for his computer - his most of the day on computer - R hand dominant - with mouse  Looking into correct table or desk for home  Did get massage about 2wks ago  Slight pull on R upper traps and scalens  No limitations for cervical flexion ,ext and rotation  Did add upper traps and scalens stretches to HEP in shower  And scapula squeezes - pt with rounded shoulders  5 x day - 10 reps    Took measurements for AROM for bilateral wrist and forearm - WNL and PROM - strength 5/5 in wrist and forearm - except supination  Grip and prehension WNL for age       Graston tool nr 2 and 4 done on volar and radial wrist and forearm sweeping  And webspace of thumb And MC spreads  Positive Tinel and phalen's still   Pt to cont withContrast 1 -2 x day at home Splint wearing decrease to night time and cont with wrist wraps -  neoprene Benik splints  to useon computer Massage volar forearm- pt was using graston tool -but that involve tight and composite grip - to use palm , ask somebody else , or get massage balls to roll over with palm  Gentle stretches for wrist extention - 10 reps- great improvement in tightness Cont at home with wall composite  extention stretchbilaterally -less of pull  slight pull -5 reps - hold 5 sec -no pressure thru palm and pain or stretch should be less than 2/10 After manual pt has very little tightness on volar sideand tolerate wall stretch much better Composite N glide to  Cont with - 5 Reps Tendon glides -AROM 8 reps             Med N glide 5 reps  Cont with HEP and follow up with Dr Rudene Christians in week               OT Education - 08/30/20 1344    Education Details progressa nd HEP    Person(s) Educated Patient    Methods Explanation;Demonstration;Tactile cues;Verbal cues;Handout    Comprehension Verbal cues required;Returned demonstration;Verbalized understanding               OT Long Term Goals - 08/30/20 1350      OT LONG TERM GOAL #1   Title Pt to be independent in HEP to decrease symptoms in the am and able to do activities more than 10 min prior to sensory changes    Baseline Numbness in the morning, when on computer , using hand in any activity within 1-2 min numbness occur- numbness on computer better - but still happening with tasks like using chop sticks, gripping tight like screwdriver - pt paying attention to loosen up on his grip or pinch    Status Partially Met      OT LONG TERM GOAL #2   Title Pt test negative for Tinel and Phalens test for CTS to wean out of daytime splint to less than 50% of day    Baseline Pt positive bilateral for phalens test and Tinel still - but report numbness did improve    Status Partially Met                 Plan - 08/30/20 1345    Clinical Impression Statement Pt made great progress in 5 visits in AROM of bilateral wrist , decrease tightness in volar wrist and forearm- grip and prehension strength now WNL for his age. Pt report now numbness when on computer - but after wearing splints this last 10 day most all the time - derease numbness in Carpal tunnel - but still positive Tinel and Phalens- pt report  weak and heavy feeling  from elbow distally - but AROM and strength improve greatly - did add some upper traps and scalens stretches as well as scapula squeezes- pt had been working from home the last 2 years and computer setup not correct - pt doing well with HEP for soft tissue mobs, stretches , nerve glides -and splint wearing now only night time - soft ones on computer - pt to follow up with Dr Rudene Christians next week    OT Occupational Profile and History Problem Focused Assessment - Including review of records relating to presenting problem    Occupational performance deficits (Please refer to evaluation for details): ADL's;IADL's;Rest and Sleep;Work;Play;Leisure    Body Structure / Function / Physical Skills ADL;UE functional use;Sensation;IADL;Proprioception;Strength;Decreased knowledge of precautions    Rehab Potential Fair    Clinical Decision Making Limited treatment options, no task modification necessary    Comorbidities Affecting Occupational Performance: None    Modification or Assistance to Complete Evaluation  No modification of tasks or assist necessary to complete eval    OT Frequency 1x / week    OT Duration --   1 wks   OT Treatment/Interventions Self-care/ADL training;Iontophoresis;Contrast Bath;Ultrasound;DME and/or AE instruction;Manual Therapy;Splinting;Patient/family education;Dry needling;Therapeutic exercise    Consulted and Agree with Plan of Care Patient           Patient will benefit from skilled therapeutic intervention in order to improve the following deficits and impairments:   Body Structure / Function / Physical Skills: ADL,UE functional use,Sensation,IADL,Proprioception,Strength,Decreased knowledge of precautions       Visit Diagnosis: Carpal tunnel syndrome, bilateral upper limbs - Plan: Ot plan of care cert/re-cert  Other disturbances of skin sensation - Plan: Ot plan of care cert/re-cert  Muscle weakness (generalized) - Plan: Ot plan of care cert/re-cert    Problem  List Patient Active Problem List   Diagnosis Date Noted  . Carpal tunnel syndrome on both sides 11/13/2019  . OSA (obstructive sleep apnea) 04/15/2019  . Morbid obesity ([place]) 02/28/2018  . Elevated hemoglobin A1c 02/25/2018  . Dysphagia 02/19/2018  . Hoffman's reflex positive 02/19/2018  . Increased tendon reflexes 02/19/2018  . Dizziness 02/14/2018  . Tinnitus, bilateral 01/10/2018  . Anxiety 11/01/2017  . Heartburn   . Other specified diseases of esophagus   . History of vertigo 05/24/2017  . GERD (gastroesophageal reflux disease) 04/12/2017  . Laryngopharyngeal reflux (LPR) 04/12/2017  . S/P nasal septoplasty 04/12/2017  . Seasonal allergies 04/12/2017  . Allergic rhinitis 04/12/2017    Rosalyn Gess OTR/L,CLT 08/30/2020, 1:54 PM  [place] [facility] [address], [place], [address] Phone: [phone]   Fax:  [phone]  Name: Toure Edmonds MRN: [id number] Date of Birth: 1980-08-28

## 2020-09-02 ENCOUNTER — Other Ambulatory Visit: Payer: Self-pay

## 2020-09-02 ENCOUNTER — Ambulatory Visit: Payer: BC Managed Care – PPO | Admitting: Urology

## 2020-09-02 ENCOUNTER — Encounter: Payer: Self-pay | Admitting: Urology

## 2020-09-02 VITALS — BP 117/79 | HR 128 | Ht 66.0 in | Wt 235.0 lb

## 2020-09-02 DIAGNOSIS — E291 Testicular hypofunction: Secondary | ICD-10-CM

## 2020-09-02 NOTE — Progress Notes (Signed)
09/02/2020 2:18 PM   Robert [name] 1981/03/13 [id number]  Referring provider: Smitty Cords, DO [address] [place],  [place] [address]  Chief Complaint  Patient presents with  . Hypogonadism     Urologic history: 1. Hypogonadism -Trial Clomid 12/19; improved T but not symptoms -TRT started 07/2018 -Symptoms tiredness, fatigue, decreased libido -Testosterone cypionate 200 mg every 2 weeks  HPI: 40 y.o. male presents for annual follow-up.   Stable symptoms on TRT  No bothersome LUTS  Labs 07/28/2020: Testosterone 474, hematocrit 51.9   PMH: Past Medical History:  Diagnosis Date  . ADHD   . GERD (gastroesophageal reflux disease)   . Wears contact lenses     Surgical History: Past Surgical History:  Procedure Laterality Date  . ESOPHAGOGASTRODUODENOSCOPY (EGD) WITH PROPOFOL N/A 06/14/2017   Procedure: ESOPHAGOGASTRODUODENOSCOPY (EGD) WITH PROPOFOL;  Surgeon: Midge Minium, MD;  [facility];  Service: Endoscopy;  Laterality: N/A;  . SEPTOPLASTY  06/13/2011  . WISDOM TOOTH EXTRACTION      Home Medications:  Allergies as of 09/02/2020   No Known Allergies     Medication List       Accurate as of September 02, 2020  2:18 PM. If you have any questions, ask your nurse or doctor.        2-3CC SYRINGE 3 ML Misc Use as directed   cyclobenzaprine 10 MG tablet Commonly known as: FLEXERIL Take 1 tablet (10 mg total) by mouth 3 (three) times daily as needed for muscle spasms.   dicyclomine 10 MG capsule Commonly known as: Bentyl Take 1 capsule (10 mg total) by mouth 4 (four) times daily -  before meals and at bedtime. As needed for cramping.   escitalopram 20 MG tablet Commonly known as: LEXAPRO Take 1 tablet (20 mg total) by mouth daily.   NEEDLE (DISP) 18 G 18G X 1-1/2" Misc Commonly known as: BD Disp Needles Use 18G to draw up Testosterone medication   NEEDLE (DISP) 21 G 21G X 1-1/2" Misc Commonly known as: BD Eclipse Needle Use  21G needle to inject Testosterone medication.   omeprazole 40 MG capsule Commonly known as: PRILOSEC TAKE 1 CAPSULE(40 MG) BY MOUTH DAILY BEFORE BREAKFAST   PreviDent 5000 Dry Mouth 1.1 % Gel dental gel Generic drug: sodium fluoride USE AS DIRECTED TO BRUSH TWICE DAILY   testosterone cypionate 200 MG/ML injection Commonly known as: DEPOTESTOSTERONE CYPIONATE ADMINISTER 1 ML(200 MG) IN THE MUSCLE EVERY 14 DAYS       Allergies: No Known Allergies  Family History: Family History  Problem Relation Age of Onset  . Cancer Maternal Grandfather 84       stomach  . Esophageal cancer Maternal Grandfather 26  . Colon cancer Neg Hx   . Prostate cancer Neg Hx   . Diabetes Neg Hx   . Hypertension Neg Hx   . Hypercholesterolemia Neg Hx     Social History:  reports that he quit smoking about 3 years ago. His smoking use included cigarettes. He has a 15.00 pack-year smoking history. He has quit using smokeless tobacco. He reports that he does not drink alcohol and does not use drugs.   Physical Exam: BP 117/79   Pulse (!) 128   Ht 5\' 6"  (1.676 m)   Wt 235 lb (106.6 kg)   BMI 37.93 kg/m   Constitutional:  Alert and oriented, No acute distress. HEENT:  AT, moist mucus membranes.  Trachea midline, no masses. Cardiovascular: No clubbing, cyanosis, or edema. Respiratory:  Normal respiratory effort, no increased work of breathing.   Assessment & Plan:    1.  Hypogonadism  Stable on TRT  Hematocrit slightly elevated 51.9  Recommend blood donation to keep hematocrit <50  Testosterone refill  Lab visit 6 months testosterone, hematocrit  Office visit 1 year   [name], MD  [facility] [address] [place], [place] [address] [phone]

## 2020-09-04 ENCOUNTER — Encounter: Payer: Self-pay | Admitting: Urology

## 2020-09-04 DIAGNOSIS — E291 Testicular hypofunction: Secondary | ICD-10-CM | POA: Insufficient documentation

## 2020-09-04 MED ORDER — TESTOSTERONE CYPIONATE 200 MG/ML IM SOLN: INTRAMUSCULAR | 3 refills | Status: DC

## 2020-09-06 DIAGNOSIS — G5603 Carpal tunnel syndrome, bilateral upper limbs: Secondary | ICD-10-CM | POA: Diagnosis not present

## 2020-09-07 ENCOUNTER — Ambulatory Visit: Payer: BC Managed Care – PPO | Admitting: Psychology

## 2020-09-15 DIAGNOSIS — R7309 Other abnormal glucose: Secondary | ICD-10-CM

## 2020-09-15 DIAGNOSIS — Z1159 Encounter for screening for other viral diseases: Secondary | ICD-10-CM

## 2020-09-15 DIAGNOSIS — Z113 Encounter for screening for infections with a predominantly sexual mode of transmission: Secondary | ICD-10-CM

## 2020-09-15 DIAGNOSIS — G4733 Obstructive sleep apnea (adult) (pediatric): Secondary | ICD-10-CM

## 2020-09-15 DIAGNOSIS — E291 Testicular hypofunction: Secondary | ICD-10-CM

## 2020-09-15 DIAGNOSIS — Z0000 Encounter for general adult medical examination without abnormal findings: Secondary | ICD-10-CM

## 2020-09-15 DIAGNOSIS — E781 Pure hyperglyceridemia: Secondary | ICD-10-CM

## 2020-09-22 ENCOUNTER — Other Ambulatory Visit: Payer: Self-pay

## 2020-09-22 ENCOUNTER — Other Ambulatory Visit: Payer: BC Managed Care – PPO

## 2020-09-22 DIAGNOSIS — E781 Pure hyperglyceridemia: Secondary | ICD-10-CM | POA: Diagnosis not present

## 2020-09-22 DIAGNOSIS — Z113 Encounter for screening for infections with a predominantly sexual mode of transmission: Secondary | ICD-10-CM | POA: Diagnosis not present

## 2020-09-22 DIAGNOSIS — G4733 Obstructive sleep apnea (adult) (pediatric): Secondary | ICD-10-CM

## 2020-09-22 DIAGNOSIS — R7309 Other abnormal glucose: Secondary | ICD-10-CM

## 2020-09-22 DIAGNOSIS — Z1159 Encounter for screening for other viral diseases: Secondary | ICD-10-CM | POA: Diagnosis not present

## 2020-09-22 DIAGNOSIS — Z0000 Encounter for general adult medical examination without abnormal findings: Secondary | ICD-10-CM

## 2020-09-22 DIAGNOSIS — E291 Testicular hypofunction: Secondary | ICD-10-CM

## 2020-09-23 ENCOUNTER — Other Ambulatory Visit: Payer: Self-pay

## 2020-09-23 DIAGNOSIS — K219 Gastro-esophageal reflux disease without esophagitis: Secondary | ICD-10-CM

## 2020-09-23 LAB — HEPATITIS C ANTIBODY
Hepatitis C Ab: NONREACTIVE
SIGNAL TO CUT-OFF: 0.01 (ref <1.00)

## 2020-09-23 LAB — COMPLETE METABOLIC PANEL WITH GFR
AG Ratio: 1.8 (calc) (ref 1.0–2.5)
ALT: 39 U/L (ref 9–46)
AST: 27 U/L (ref 10–40)
Albumin: 4.8 g/dL (ref 3.6–5.1)
Alkaline phosphatase (APISO): 58 U/L (ref 36–130)
BUN: 17 mg/dL (ref 7–25)
CO2: 27 mmol/L (ref 20–32)
Calcium: 9.7 mg/dL (ref 8.6–10.3)
Chloride: 102 mmol/L (ref 98–110)
Creat: 1.24 mg/dL (ref 0.60–1.35)
GFR, Est African American: 85 mL/min/{1.73_m2} (ref >=60)
GFR, Est Non African American: 73 mL/min/{1.73_m2} (ref >=60)
Globulin: 2.7 g/dL (calc) (ref 1.9–3.7)
Glucose, Bld: 88 mg/dL (ref 65–99)
Potassium: 4.4 mmol/L (ref 3.5–5.3)
Sodium: 138 mmol/L (ref 135–146)
Total Bilirubin: 0.6 mg/dL (ref 0.2–1.2)
Total Protein: 7.5 g/dL (ref 6.1–8.1)

## 2020-09-23 LAB — TSH: TSH: 1.85 mIU/L (ref 0.40–4.50)

## 2020-09-23 LAB — CBC WITH DIFFERENTIAL/PLATELET
Absolute Monocytes: 741 cells/uL (ref 200–950)
Basophils Absolute: 68 cells/uL (ref 0–200)
Basophils Relative: 1 %
Eosinophils Absolute: 211 cells/uL (ref 15–500)
Eosinophils Relative: 3.1 %
HCT: 52 % — ABNORMAL HIGH (ref 38.5–50.0)
Hemoglobin: 16.8 g/dL (ref 13.2–17.1)
Lymphs Abs: 1945 cells/uL (ref 850–3900)
MCH: 27.2 pg (ref 27.0–33.0)
MCHC: 32.3 g/dL (ref 32.0–36.0)
MCV: 84.3 fL (ref 80.0–100.0)
MPV: 10.2 fL (ref 7.5–12.5)
Monocytes Relative: 10.9 %
Neutro Abs: 3835 cells/uL (ref 1500–7800)
Neutrophils Relative %: 56.4 %
Platelets: 264 10*3/uL (ref 140–400)
RBC: 6.17 10*6/uL — ABNORMAL HIGH (ref 4.20–5.80)
RDW: 13.2 % (ref 11.0–15.0)
Total Lymphocyte: 28.6 %
WBC: 6.8 10*3/uL (ref 3.8–10.8)

## 2020-09-23 LAB — LIPID PANEL
Cholesterol: 173 mg/dL (ref <200)
HDL: 40 mg/dL (ref >=40)
LDL Cholesterol (Calc): 105 mg/dL (calc) — ABNORMAL HIGH
Non-HDL Cholesterol (Calc): 133 mg/dL (calc) — ABNORMAL HIGH (ref <130)
Total CHOL/HDL Ratio: 4.3 (calc) (ref <5.0)
Triglycerides: 162 mg/dL — ABNORMAL HIGH (ref <150)

## 2020-09-23 LAB — HIV ANTIBODY (ROUTINE TESTING W REFLEX): HIV 1&2 Ab, 4th Generation: NONREACTIVE

## 2020-09-23 LAB — HEMOGLOBIN A1C
Hgb A1c MFr Bld: 5.8 % of total Hgb — ABNORMAL HIGH (ref <5.7)
Mean Plasma Glucose: 120 mg/dL
eAG (mmol/L): 6.6 mmol/L

## 2020-09-23 LAB — RPR: RPR Ser Ql: NONREACTIVE

## 2020-09-23 MED ORDER — OMEPRAZOLE 40 MG PO CPDR: DELAYED_RELEASE_CAPSULE | ORAL | 2 refills | Status: DC

## 2020-09-29 ENCOUNTER — Other Ambulatory Visit: Payer: Self-pay

## 2020-09-29 ENCOUNTER — Telehealth (INDEPENDENT_AMBULATORY_CARE_PROVIDER_SITE_OTHER): Payer: PRIVATE HEALTH INSURANCE | Admitting: Psychiatry

## 2020-09-29 ENCOUNTER — Encounter: Payer: Self-pay | Admitting: Psychiatry

## 2020-09-29 DIAGNOSIS — G4701 Insomnia due to medical condition: Secondary | ICD-10-CM | POA: Diagnosis not present

## 2020-09-29 DIAGNOSIS — F411 Generalized anxiety disorder: Secondary | ICD-10-CM

## 2020-09-29 MED ORDER — PROPRANOLOL HCL 10 MG PO TABS: 10.0000 mg | ORAL_TABLET | Freq: Two times a day (BID) | ORAL | 0 refills | Status: DC

## 2020-09-29 MED ORDER — VENLAFAXINE HCL ER 75 MG PO CP24: 75.0000 mg | ORAL_CAPSULE | Freq: Every day | ORAL | 0 refills | Status: DC
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
## 2020-09-29 NOTE — Patient Instructions (Signed)

## 2020-09-30 ENCOUNTER — Encounter: Payer: Self-pay | Admitting: Family Medicine

## 2020-09-30 ENCOUNTER — Other Ambulatory Visit (HOSPITAL_COMMUNITY)
Admission: RE | Admit: 2020-09-30 | Discharge: 2020-09-30 | Disposition: A | Discharge status: Home / Self Care | Payer: BC Managed Care – PPO | Source: Ambulatory Visit | Attending: Family Medicine | Admitting: Family Medicine

## 2020-09-30 ENCOUNTER — Ambulatory Visit (INDEPENDENT_AMBULATORY_CARE_PROVIDER_SITE_OTHER): Payer: BC Managed Care – PPO | Admitting: Family Medicine

## 2020-09-30 ENCOUNTER — Encounter: Payer: Self-pay | Admitting: Psychiatry

## 2020-09-30 ENCOUNTER — Other Ambulatory Visit: Payer: Self-pay

## 2020-09-30 VITALS — BP 135/84 | HR 92 | Temp 96.9°F | Ht 66.0 in | Wt 234.4 lb

## 2020-09-30 DIAGNOSIS — G43909 Migraine, unspecified, not intractable, without status migrainosus: Secondary | ICD-10-CM | POA: Insufficient documentation

## 2020-09-30 DIAGNOSIS — F411 Generalized anxiety disorder: Secondary | ICD-10-CM | POA: Diagnosis not present

## 2020-09-30 DIAGNOSIS — Z113 Encounter for screening for infections with a predominantly sexual mode of transmission: Secondary | ICD-10-CM

## 2020-09-30 DIAGNOSIS — R109 Unspecified abdominal pain: Secondary | ICD-10-CM

## 2020-09-30 DIAGNOSIS — G4733 Obstructive sleep apnea (adult) (pediatric): Secondary | ICD-10-CM

## 2020-09-30 DIAGNOSIS — Z0000 Encounter for general adult medical examination without abnormal findings: Secondary | ICD-10-CM | POA: Diagnosis not present

## 2020-09-30 DIAGNOSIS — G4701 Insomnia due to medical condition: Secondary | ICD-10-CM | POA: Diagnosis not present

## 2020-09-30 MED ORDER — DICYCLOMINE HCL 10 MG PO CAPS: 10.0000 mg | ORAL_CAPSULE | Freq: Every evening | ORAL | 3 refills | Status: DC | PRN

## 2020-09-30 NOTE — Progress Notes (Signed)
Subjective:    Patient ID: Robert [name], male    DOB: [birth year], 40 y.o.   MRN: [id number]  Robert [name] is a 40 y.o. male presenting on 09/30/2020 for Annual Exam   HPI   Here for Annual Physical and Lab Review.  Morbid Obesity BMI >37 Pre-Diabetes Lifestyle: - Weight about 1 lb up in past 4-5 months - Diet: More balanced meals, breakfast higher protein, he is trying to work on intermittent fasting regimen, Drinks sweet tea often, admits goal to limit this in future. He is working from home, eating differently - Exercise:   GERD Abdominal Cramping / Bloating Improved on Dicyclomine in PM for abdominal digestive symptoms and cramping. Usually taking in PM. Needs refill  Generalized Anxiety Disorder Insomnia Followed by Dr Hoover Brunette [facility]. Previously w/ Dr Maryruth Bun Has tapered off Escitalopram. Now on new rx Venlafaxine 75mg  XR daily  OSA, on CPAP - Patient reports prior history of dx OSA and on CPAP for years, prior to treatment initial symptoms were snoring, daytime sleepiness and fatigue, has had several sleep studies in the past. - Today reports that sleep apnea is well controlled. He uses the CPAP machine every night. Tolerates the machine well, and thinks that sleeps better with it and feels good. No new concerns or symptoms.   Health Maintenance: STD Screening done, negative, except needs urine GC Chlamydia, will check today.  Depression screen [facility] 2/9 09/29/2020 11/13/2019 04/15/2019  Decreased Interest 1 0 0  Down, Depressed, Hopeless 0 0 0  PHQ - 2 Score 1 0 0  Altered sleeping 2 - -  Tired, decreased energy 1 - -  Change in appetite 2 - -  Feeling bad or failure about yourself  0 - -  Trouble concentrating 0 - -  Moving slowly or fidgety/restless 0 - -  Suicidal thoughts 0 - -  PHQ-9 Score 6 - -  Difficult doing work/chores Not difficult at all - -    Past Medical History:  Diagnosis Date  . ADHD   . Anxiety   . GERD (gastroesophageal reflux  disease)   . Wears contact lenses    Past Surgical History:  Procedure Laterality Date  . ESOPHAGOGASTRODUODENOSCOPY (EGD) WITH PROPOFOL N/A 06/14/2017   Procedure: ESOPHAGOGASTRODUODENOSCOPY (EGD) WITH PROPOFOL;  Surgeon: 08/12/2017, MD;  Location: [facility];  Service: Endoscopy;  Laterality: N/A;  . SEPTOPLASTY  06/13/2011  . WISDOM TOOTH EXTRACTION     Social History   Socioeconomic History  . Marital status: Married    Spouse name: Not on file  . Number of children: 1  . Years of education: Vocational  . Highest education level: Not on file  Occupational History  . 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 Smoking status: Former Smoker    Packs/day: 1.00    Years: 15.00    Pack years: 15.00    Types: Cigarettes    Quit date: 10/10/2016    Years since quitting: 3.9  . Smokeless tobacco: Former 12/10/2016  . Tobacco comment: Quit using vaping  Vaping Use  . Vaping Use: Former  . Quit date: 04/14/2018  Substance and Sexual Activity  . Alcohol use: No  . Drug use: No  . Sexual activity: Yes    Birth control/protection: Surgical    Comment: Vasectomy 2013  Other Topics Concern  . Not on file  Social History Narrative  . Not on file   Social Determinants of Health  Financial Resource Strain: Not on file  Food Insecurity: Not on file  Transportation Needs: Not on file  Physical Activity: Not on file  Stress: Not on file  Social Connections: Not on file  Intimate Partner Violence: Not on file   Family History  Problem Relation Age of Onset  . Depression Mother   . Suicidality Mother   . Cancer Maternal Grandfather 84       stomach  . Esophageal cancer Maternal Grandfather 40  . Depression Sister   . Colon cancer Neg Hx   . Prostate cancer Neg Hx   . Diabetes Neg Hx   . Hypertension Neg Hx   . Hypercholesterolemia Neg Hx    Current Outpatient Medications on File Prior to Visit  Medication Sig  . chlorhexidine (PERIDEX) 0.12  % solution SMARTSIG:By Mouth  . finasteride (PROPECIA) 1 MG tablet Take 1 tablet by mouth daily.  Marland Kitchen NEEDLE, DISP, 18 G (BD DISP NEEDLES) 18G X 1-1/2" MISC Use 18G to draw up Testosterone medication  . NEEDLE, DISP, 21 G (BD ECLIPSE NEEDLE) 21G X 1-1/2" MISC Use 21G needle to inject Testosterone medication.  Marland Kitchen omeprazole (PRILOSEC) 40 MG capsule TAKE 1 CAPSULE(40 MG) BY MOUTH DAILY BEFORE BREAKFAST  . propranolol (INDERAL) 10 MG tablet Take 1 tablet (10 mg total) by mouth 2 (two) times daily.  . Syringe, Disposable, (2-3CC SYRINGE) 3 ML MISC Use as directed  . testosterone cypionate (DEPOTESTOSTERONE CYPIONATE) 200 MG/ML injection ADMINISTER 1 ML(200 MG) IN THE MUSCLE EVERY 14 DAYS  . venlafaxine XR (EFFEXOR-XR) 75 MG 24 hr capsule Take 1 capsule (75 mg total) by mouth daily.   No current facility-administered medications on file prior to visit.    Review of Systems  Constitutional: Negative for activity change, appetite change, chills, diaphoresis, fatigue and fever.  HENT: Negative for congestion and hearing loss.   Eyes: Negative for visual disturbance.  Respiratory: Negative for apnea, cough, chest tightness, shortness of breath and wheezing.   Cardiovascular: Negative for chest pain, palpitations and leg swelling.  Gastrointestinal: Negative for abdominal pain, anal bleeding, blood in stool, constipation, diarrhea, nausea and vomiting.  Endocrine: Negative for cold intolerance.  Genitourinary: Negative for decreased urine volume, difficulty urinating, dysuria, frequency and hematuria.  Musculoskeletal: Negative for arthralgias, back pain and neck pain.  Skin: Negative for rash.  Allergic/Immunologic: Negative for environmental allergies.  Neurological: Negative for dizziness, weakness, light-headedness, numbness and headaches.  Hematological: Negative for adenopathy.  Psychiatric/Behavioral: Negative for behavioral problems, dysphoric mood and sleep disturbance.   Per HPI unless  specifically indicated above     Objective:    BP 135/84 (BP Location: Left Arm, Patient Position: Sitting, Cuff Size: Normal)   Pulse 92   Temp (!) 96.9 F (36.1 C) (Temporal)   Ht 5\' 6"  (1.676 m)   Wt 234 lb 6.4 oz (106.3 kg)   SpO2 98%   BMI 37.83 kg/m   Wt Readings from Last 3 Encounters:  09/30/20 234 lb 6.4 oz (106.3 kg)  09/02/20 235 lb (106.6 kg)  04/23/20 235 lb (106.6 kg)    Physical Exam Vitals and nursing note reviewed.  Constitutional:      General: He is not in acute distress.    Appearance: He is well-developed. He is not diaphoretic.     Comments: Well-appearing, comfortable, cooperative  HENT:     Head: Normocephalic and atraumatic.  Eyes:     General:        Right eye: No discharge.  Left eye: No discharge.     Conjunctiva/sclera: Conjunctivae normal.     Pupils: Pupils are equal, round, and reactive to light.  Neck:     Thyroid: No thyromegaly.  Cardiovascular:     Rate and Rhythm: Normal rate and regular rhythm.     Heart sounds: Normal heart sounds. No murmur heard.   Pulmonary:     Effort: Pulmonary effort is normal. No respiratory distress.     Breath sounds: Normal breath sounds. No wheezing or rales.  Abdominal:     General: Bowel sounds are normal. There is no distension.     Palpations: Abdomen is soft. There is no mass.     Tenderness: There is no abdominal tenderness.  Musculoskeletal:        General: No tenderness. Normal range of motion.     Cervical back: Normal range of motion and neck supple.     Comments: Upper / Lower Extremities: - Normal muscle tone, strength bilateral upper extremities 5/5, lower extremities 5/5  Lymphadenopathy:     Cervical: No cervical adenopathy.  Skin:    General: Skin is warm and dry.     Findings: No erythema or rash.  Neurological:     Mental Status: He is alert and oriented to person, place, and time.     Comments: Distal sensation intact to light touch all extremities  Psychiatric:         Behavior: Behavior normal.     Comments: Well groomed, good eye contact, normal speech and thoughts        Results for orders placed or performed in visit on 09/22/20  RPR  Result Value Ref Range   RPR Ser Ql NON-REACTIVE NON-REACTI  HIV Antibody (routine testing w rflx)  Result Value Ref Range   HIV 1&2 Ab, 4th Generation NON-REACTIVE NON-REACTI  Hepatitis C antibody  Result Value Ref Range   Hepatitis C Ab NON-REACTIVE NON-REACTI   SIGNAL TO CUT-OFF 0.01 <1.00  TSH  Result Value Ref Range   TSH 1.85 0.40 - 4.50 mIU/L  Lipid panel  Result Value Ref Range   Cholesterol 173 <200 mg/dL   HDL 40 > OR = 40 mg/dL   Triglycerides 315 (H) <150 mg/dL   LDL Cholesterol (Calc) 105 (H) mg/dL (calc)   Total CHOL/HDL Ratio 4.3 <5.0 (calc)   Non-HDL Cholesterol (Calc) 133 (H) <130 mg/dL (calc)  COMPLETE METABOLIC PANEL WITH GFR  Result Value Ref Range   Glucose, Bld 88 65 - 99 mg/dL   BUN 17 7 - 25 mg/dL   Creat 4.00 8.67 - 6.19 mg/dL   GFR, Est Non African American 73 > OR = 60 mL/min/1.11m2   GFR, Est African American 85 > OR = 60 mL/min/1.46m2   BUN/Creatinine Ratio NOT APPLICABLE 6 - 22 (calc)   Sodium 138 135 - 146 mmol/L   Potassium 4.4 3.5 - 5.3 mmol/L   Chloride 102 98 - 110 mmol/L   CO2 27 20 - 32 mmol/L   Calcium 9.7 8.6 - 10.3 mg/dL   Total Protein 7.5 6.1 - 8.1 g/dL   Albumin 4.8 3.6 - 5.1 g/dL   Globulin 2.7 1.9 - 3.7 g/dL (calc)   AG Ratio 1.8 1.0 - 2.5 (calc)   Total Bilirubin 0.6 0.2 - 1.2 mg/dL   Alkaline phosphatase (APISO) 58 36 - 130 U/L   AST 27 10 - 40 U/L   ALT 39 9 - 46 U/L  CBC with Differential/Platelet  Result Value Ref Range  WBC 6.8 3.8 - 10.8 Thousand/uL   RBC 6.17 (H) 4.20 - 5.80 Million/uL   Hemoglobin 16.8 13.2 - 17.1 g/dL   HCT 62.3 (H) 76.2 - 83.1 %   MCV 84.3 80.0 - 100.0 fL   MCH 27.2 27.0 - 33.0 pg   MCHC 32.3 32.0 - 36.0 g/dL   RDW 51.7 61.6 - 07.3 %   Platelets 264 140 - 400 Thousand/uL   MPV 10.2 7.5 - 12.5 fL   Neutro Abs  3,835 1,500 - 7,800 cells/uL   Lymphs Abs 1,945 850 - 3,900 cells/uL   Absolute Monocytes 741 200 - 950 cells/uL   Eosinophils Absolute 211 15 - 500 cells/uL   Basophils Absolute 68 0 - 200 cells/uL   Neutrophils Relative % 56.4 %   Total Lymphocyte 28.6 %   Monocytes Relative 10.9 %   Eosinophils Relative 3.1 %   Basophils Relative 1.0 %  Hemoglobin A1c  Result Value Ref Range   Hgb A1c MFr Bld 5.8 (H) <5.7 % of total Hgb   Mean Plasma Glucose 120 mg/dL   eAG (mmol/L) 6.6 mmol/L      Assessment & Plan:   Problem List Items Addressed This Visit    OSA (obstructive sleep apnea)   Morbid obesity (HCC)   Migraine headache   Insomnia due to medical condition   GAD (generalized anxiety disorder)    Other Visit Diagnoses    Annual physical exam    -  Primary   Screen for STD (sexually transmitted disease)       Abdominal cramping       Relevant Medications   dicyclomine (BENTYL) 10 MG capsule      Updated Health Maintenance information UTD COVID Vaccine, offered Booster Reviewed recent lab results with patient  Morbid Obesity BMI >37 Comorbid with GERD, Hypogonadism, Elevated A1c, Anxiety. Encouraged improvement to lifestyle with diet and exercise Goal of weight loss - discussion on weight loss strategy  Functional Bowel Symptoms Re order Dicyclomine  History of migraines Completed form w/ photosensitivity exception for window tint application  Meds ordered this encounter  Medications  . dicyclomine (BENTYL) 10 MG capsule    Sig: Take 1 capsule (10 mg total) by mouth at bedtime as needed for spasms (cramping).    Dispense:  90 capsule    Refill:  3     Follow up plan: Return in about 6 months (around 04/01/2021) for 6 month follow-up Weight, Anxiety updates.  Saralyn Pilar, DO [facility] 09/30/2020, 10:39 AM

## 2020-09-30 NOTE — Assessment & Plan Note (Signed)
Improved Associated insomnia Followed by [facility] Psychiatry Dr Elna Breslow Off Lexapro  On med management Venlafaxine

## 2020-09-30 NOTE — Assessment & Plan Note (Signed)
Well controlled, chronic OSA on CPAP, now - Good adherence to CPAP nightly - Continue current CPAP therapy, patient seems to be benefiting from therapy  

## 2020-09-30 NOTE — Patient Instructions (Addendum)
Thank you for coming to the office today.  Keep up the great work. Goal to keep improving diet and exercise as discussed. Intermittent fasting sounds like a good plan.  Refilled Dicyclomine for 90 day with refills.  Overall lab results look good.  We will check the Urine test for GC Chlamydia today. Stay tuned on result.  Seborrheic Dermatitis of scalp.  Please schedule a Follow-up Appointment to: Return in about 6 months (around 04/01/2021) for 6 month follow-up Weight, Anxiety updates.  If you have any other questions or concerns, please feel free to call the office or send a message through MyChart. You may also schedule an earlier appointment if necessary.  Additionally, you may be receiving a survey about your experience at our office within a few days to 1 week by e-mail or mail. We value your feedback.  Saralyn Pilar, DO [facility] [place]

## 2020-10-04 LAB — GC/CHLAMYDIA PROBE AMP (~~LOC~~) NOT AT ARMC
Chlamydia: NEGATIVE
Comment: NEGATIVE
Comment: NORMAL
Neisseria Gonorrhea: NEGATIVE

## 2020-10-11 ENCOUNTER — Other Ambulatory Visit: Payer: Self-pay | Admitting: Family Medicine

## 2020-10-11 DIAGNOSIS — F419 Anxiety disorder, unspecified: Secondary | ICD-10-CM

## 2020-11-03 ENCOUNTER — Encounter: Payer: Self-pay | Admitting: Psychiatry

## 2020-11-03 ENCOUNTER — Other Ambulatory Visit: Payer: Self-pay

## 2020-11-03 ENCOUNTER — Telehealth (INDEPENDENT_AMBULATORY_CARE_PROVIDER_SITE_OTHER): Payer: PRIVATE HEALTH INSURANCE | Admitting: Psychiatry

## 2020-11-03 DIAGNOSIS — F411 Generalized anxiety disorder: Secondary | ICD-10-CM | POA: Diagnosis not present

## 2020-11-03 DIAGNOSIS — G4701 Insomnia due to medical condition: Secondary | ICD-10-CM | POA: Diagnosis not present

## 2020-11-03 NOTE — Progress Notes (Signed)
Virtual Visit via Video Note  I connected with Robert [name] on 11/03/20 at  1:00 PM EDT by a video enabled telemedicine application and verified that I am speaking with the correct person using two identifiers.  Location Provider Location : [facility] Patient Location : Home  Participants: Patient , Provider    I discussed the limitations of evaluation and management by telemedicine and the availability of in person appointments. The patient expressed understanding and agreed to proceed.    I discussed the assessment and treatment plan with the patient. The patient was provided an opportunity to ask questions and all were answered. The patient agreed with the plan and demonstrated an understanding of the instructions.   The patient was advised to call back or seek an in-person evaluation if the symptoms worsen or if the condition fails to improve as anticipated.   BH MD OP Progress Note  11/03/2020 1:32 PM Robert [name]  MRN:  [id number]  Chief Complaint:  Chief Complaint    Follow-up; Anxiety; Insomnia     HPI: Robert [name] is a 40 year old divorced, Caucasian male, lives in [place], employed, has a history of generalized anxiety disorder, insomnia, gastroesophageal reflux disease was evaluated by telemedicine today.  Patient today reports he continues to struggle with sleep.  He however reports he does not have a good sleep hygiene and it is all over the place.  Patient reports when he has difficulty sleeping he plays video games.  That also could have an impact on his sleep quality.  He does use CPAP on a regular basis.  Patient reports anxiety symptoms are manageable.  He is often able to distract himself when he has a lot of anxiety and that helps.  He is compliant on the venlafaxine.  Patient denies any suicidality, homicidality or perceptual disturbances.  Patient denies side effects to medications.  Patient denies any other concerns today.  Visit Diagnosis:     ICD-10-CM   1. GAD (generalized anxiety disorder)  F41.1   2. Insomnia due to medical condition  G47.01    Mood, OSA    Past Psychiatric History: I have reviewed past psychiatric history from progress note on 09/29/2020.  Past trials of Lexapro, Wellbutrin, BuSpar, venlafaxine  Past Medical History:  Past Medical History:  Diagnosis Date  . ADHD   . Anxiety   . GERD (gastroesophageal reflux disease)   . Wears contact lenses     Past Surgical History:  Procedure Laterality Date  . ESOPHAGOGASTRODUODENOSCOPY (EGD) WITH PROPOFOL N/A 06/14/2017   Procedure: ESOPHAGOGASTRODUODENOSCOPY (EGD) WITH PROPOFOL;  Surgeon: Midge Minium, MD;  Location: [facility];  Service: Endoscopy;  Laterality: N/A;  . SEPTOPLASTY  06/13/2011  . WISDOM TOOTH EXTRACTION      Family Psychiatric History: I have reviewed family psychiatric history from progress note on 09/29/2020  Family History:  Family History  Problem Relation Age of Onset  . Depression Mother   . Suicidality Mother   . Cancer Maternal Grandfather 84       stomach  . Esophageal cancer Maternal Grandfather 49  . Depression Sister   . Colon cancer Neg Hx   . Prostate cancer Neg Hx   . Diabetes Neg Hx   . Hypertension Neg Hx   . Hypercholesterolemia Neg Hx     Social History: I have reviewed social history from progress note on 09/29/2020 Social History   Socioeconomic History  . Marital status: Divorced    Spouse name: Not on file  . Number of  children: 1  . Years of education: Vocational  . Highest education level: Not on file  Occupational History  . Occupation: IT    Comment: Commuting to [employer]  . Smoking status: Former Smoker    Packs/day: 1.00    Years: 15.00    Pack years: 15.00    Types: Cigarettes    Quit date: 10/10/2016    Years since quitting: 4.0  . Smokeless tobacco: Former Neurosurgeon  . Tobacco comment: Quit using vaping  Vaping Use  . Vaping Use: Former  . Quit date: 04/14/2018   Substance and Sexual Activity  . Alcohol use: No  . Drug use: No  . Sexual activity: Yes    Birth control/protection: Surgical    Comment: Vasectomy 2013  Other Topics Concern  . Not on file  Social History Narrative  . Not on file   Social Determinants of Health   Financial Resource Strain: Not on file  Food Insecurity: Not on file  Transportation Needs: Not on file  Physical Activity: Not on file  Stress: Not on file  Social Connections: Not on file    Allergies: No Known Allergies  Metabolic Disorder Labs: Lab Results  Component Value Date   HGBA1C 5.8 (H) 09/22/2020   MPG 120 09/22/2020   MPG 114 02/21/2018   Lab Results  Component Value Date   PROLACTIN 8.9 05/16/2018   Lab Results  Component Value Date   CHOL 173 09/22/2020   TRIG 162 (H) 09/22/2020   HDL 40 09/22/2020   CHOLHDL 4.3 09/22/2020   LDLCALC 105 (H) 09/22/2020   LDLCALC 98 02/21/2018   Lab Results  Component Value Date   TSH 1.85 09/22/2020   TSH 1.04 02/21/2018    Therapeutic Level Labs: No results found for: LITHIUM No results found for: VALPROATE No components found for:  CBMZ  Current Medications: Current Outpatient Medications  Medication Sig Dispense Refill  . chlorhexidine (PERIDEX) 0.12 % solution SMARTSIG:By Mouth    . dicyclomine (BENTYL) 10 MG capsule Take 1 capsule (10 mg total) by mouth at bedtime as needed for spasms (cramping). 90 capsule 3  . finasteride (PROPECIA) 1 MG tablet Take 1 tablet by mouth daily.    Marland Kitchen NEEDLE, DISP, 18 G (BD DISP NEEDLES) 18G X 1-1/2" MISC Use 18G to draw up Testosterone medication 50 each 0  . NEEDLE, DISP, 21 G (BD ECLIPSE NEEDLE) 21G X 1-1/2" MISC Use 21G needle to inject Testosterone medication. 50 each 0  . omeprazole (PRILOSEC) 40 MG capsule TAKE 1 CAPSULE(40 MG) BY MOUTH DAILY BEFORE BREAKFAST 90 capsule 2  . propranolol (INDERAL) 10 MG tablet Take 1 tablet (10 mg total) by mouth 2 (two) times daily. 180 tablet 0  . Syringe,  Disposable, (2-3CC SYRINGE) 3 ML MISC Use as directed 100 each 0  . testosterone cypionate (DEPOTESTOSTERONE CYPIONATE) 200 MG/ML injection ADMINISTER 1 ML(200 MG) IN THE MUSCLE EVERY 14 DAYS 4 mL 3  . venlafaxine XR (EFFEXOR-XR) 75 MG 24 hr capsule Take 1 capsule (75 mg total) by mouth daily. 90 capsule 0   No current facility-administered medications for this visit.     Musculoskeletal: Strength & Muscle Tone: UTA Gait & Station: UTA Patient leans: N/A  Psychiatric Specialty Exam: Review of Systems  Psychiatric/Behavioral: Positive for sleep disturbance. The patient is nervous/anxious.   All other systems reviewed and are negative.   There were no vitals taken for this visit.There is no height or weight on file to calculate BMI.  General Appearance: Casual  Eye Contact:  Fair  Speech:  Clear and Coherent  Volume:  Normal  Mood:  Anxious  Affect:  Congruent  Thought Process:  Goal Directed and Descriptions of Associations: Intact  Orientation:  Full (Time, Place, and Person)  Thought Content: Logical   Suicidal Thoughts:  No  Homicidal Thoughts:  No  Memory:  Immediate;   Fair Recent;   Fair Remote;   Fair  Judgement:  Fair  Insight:  Fair  Psychomotor Activity:  Normal  Concentration:  Concentration: Fair and Attention Span: Fair  Recall:  [employer] of Knowledge: Fair  Language: Fair  Akathisia:  No  Handed:  Right  AIMS (if indicated): UTA  Assets:  Communication Skills Desire for Improvement Housing Social Support  ADL's:  Intact  Cognition: WNL  Sleep:  Poor   Screenings: GAD-7   Flowsheet Row Video Visit from 11/03/2020 in [facility] Video Visit from 09/29/2020 in [facility] Office Visit from 11[facility]13/2019 in [facility] Office Visit from 02/28/2018 in [facility] Office Visit from 11/01/2017 in [facility]  Total GAD-7 Score 7 3 3  0 2    PHQ2-9    Flowsheet Row Video Visit from 09/29/2020 in [facility] Office Visit from 11/13/2019 in [facility] Office Visit from 04/15/2019 in [facility] Office Visit from 02/28/2018 in [facility] Office Visit from 01/10/2018 in [facility]  PHQ-2 Total Score 1 0 0 0 0  PHQ-9 Total Score 6 -- -- -- --    Flowsheet Row Video Visit from 09/29/2020 in [facility]  C-SSRS RISK CATEGORY No Risk       Assessment and Plan: Harlyn Italiano is a 40 year old divorced Caucasian male with history of generalized anxiety disorder was evaluated by telemedicine today.  Patient is biologically predisposed given his family history.  Patient with psychosocial stressors of pandemic, relationship struggles.  Patient is currently struggling with sleep although stable with regards to his anxiety.  Plan Generalized anxiety disorder-stable GAD-7 today equals 7 Continue CBT with Mr. 20 Continue venlafaxine extended release 75 mg p.o. daily in the evening Propranolol 10 mg p.o. twice daily  Insomnia-unstable Discussed sleep hygiene techniques Continue CPAP Patient is not interested in a sleep aid at this time  Patient to fax medical records from his previous provider with Doctor on demand.  Follow-up in clinic in person in 2 months or sooner as needed.  This note was generated in part or whole with voice recognition software. Voice recognition is usually quite accurate but there are transcription errors that can and very often do occur. I apologize for any typographical errors that were not detected and corrected.       Delight Ovens, MD 11/03/2020, 1:32 PM

## 2020-12-21 ENCOUNTER — Telehealth: Payer: Self-pay

## 2020-12-21 DIAGNOSIS — F411 Generalized anxiety disorder: Secondary | ICD-10-CM

## 2020-12-21 MED ORDER — VENLAFAXINE HCL ER 75 MG PO CP24: 75.0000 mg | ORAL_CAPSULE | Freq: Every day | ORAL | 0 refills | Status: DC

## 2020-12-21 MED ORDER — PROPRANOLOL HCL 10 MG PO TABS: 10.0000 mg | ORAL_TABLET | Freq: Two times a day (BID) | ORAL | 0 refills | Status: DC

## 2020-12-21 NOTE — Telephone Encounter (Signed)
pt needs refills on his medications the effexor and the propranolol

## 2020-12-21 NOTE — Telephone Encounter (Signed)
Have sent Effexor and propranolol to pharmacy.

## 2020-12-22 ENCOUNTER — Ambulatory Visit: Payer: BC Managed Care – PPO | Admitting: Family Medicine

## 2020-12-22 ENCOUNTER — Other Ambulatory Visit: Payer: Self-pay

## 2020-12-22 ENCOUNTER — Encounter: Payer: Self-pay | Admitting: Family Medicine

## 2020-12-22 VITALS — BP 126/83 | HR 83 | Ht 66.0 in | Wt 238.4 lb

## 2020-12-22 DIAGNOSIS — M10471 Other secondary gout, right ankle and foot: Secondary | ICD-10-CM | POA: Diagnosis not present

## 2020-12-22 MED ORDER — PREDNISONE 20 MG PO TABS: ORAL_TABLET | ORAL | 0 refills | Status: DC

## 2020-12-22 NOTE — Patient Instructions (Addendum)
Thank you for coming to the office today.  Your Right toe pain is most likely caused an Acute Gout Flare - Gout is a chronic problem that will have episodic flare ups with pain, redness, swelling of a joint, most common spots are big toe, foot and ankle, knee or sometimes hands or wrists. It is caused by small crystals made of Uric Acid that form in the joint causing pain and swelling.  Either Ibuprofen 600mg  3 times a day as needed OR  instead Aleve/ Naproxen 250 1-2 pills  with food twice a day for up to 10 days, if pain is not resolved then you can take it up to 14 days and then as needed   For all gout flares, the sooner you start the medication, then the shorter the flare lasts. Go ahead and start taking Naproxen as soon as you get significant gout pain and swelling again in the future, and if it is not improving within 48 hours then you can follow-up at our office. OR if you don't start medication you can come to the office within 24-48 hours for treatment here.  Gout flares can repeat again soon after they resolve in the same spot or other joints, and may need repeat treatment.  Our goal is to prevent future gout flares. Try to avoid dietary triggers that are the most common causes of gout flares. - Avoid the following foods/drinks: - Red meat, organ meat (liver) - Alcohol (especially beer, also wine, liquor) - Processed foods / carbs (white bread, white rice, pasta, sugar) - Sugary drinks (sweet tea, soda) - Shellfish, shrimp / lobster  - Foods that are preferred to eat: - Beans, Lentils, Whole grains, Quinoa - Fruits, Vegetables - Dairy, Cheese, Yogurt - Soy based protein  We can do a blood test to check Uric Acid level, but the best way to confirm a diagnosis and help 04-30-1978 determine the exact treatment you need is to drain the swelling and analyze a sample of the fluid for crystals. We do not do this procedure here at our office, and would ask that you notify us or come in for  evaluation and we can get you urgently scheduled with Orthopedics or Rheumatology office.   Please schedule a Follow-up Appointment to: Return if symptoms worsen or fail to improve, for gout.  If you have any other questions or concerns, please feel free to call the office or send a message through MyChart. You may also schedule an earlier appointment if necessary.  Additionally, you may be receiving a survey about your experience at our office within a few days to 1 week by e-mail or mail. We value your feedback.  [place], DO [facility], [facility]   Gout  Gout is a condition that causes painful swelling of the joints. Gout is a type of inflammation of the joints (arthritis). This condition is caused by having too much uric acid in the body. Uric acid is a chemical that forms when the body breaks down substances called purines.Purines are important for building body proteins. When the body has too much uric acid, sharp crystals can form and build up inside the joints. This causes pain and swelling. Gout attacks can happen quickly and may be very painful (acute gout). Over time, the attacks can affect more joints and become more frequent (chronic gout). Gout can also cause uric acid to build up under the skin and inside thekidneys. What are the causes? This condition is caused by  too much uric acid in your blood. This can happen because: Your kidneys do not remove enough uric acid from your blood. This is the most common cause. Your body makes too much uric acid. This can happen with some cancers and cancer treatments. It can also occur if your body is breaking down too many red blood cells (hemolytic anemia). You eat too many foods that are high in purines. These foods include organ meats and some seafood. Alcohol, especially beer, is also high in purines. A gout attack may be triggered by trauma or stress. What increases the risk? You are more likely to develop this  condition if you: Have a family history of gout. Are male and middle-aged. Are male and have gone through menopause. Are obese. Frequently drink alcohol, especially beer. Are dehydrated. Lose weight too quickly. Have an organ transplant. Have lead poisoning. Take certain medicines, including aspirin, cyclosporine, diuretics, levodopa, and niacin. Have kidney disease. Have a skin condition called psoriasis. What are the signs or symptoms? An attack of acute gout happens quickly. It usually occurs in just one joint. The most common place is the big toe. Attacks often start at night. Other joints that may be affected include joints of the feet, ankle, knee, fingers, wrist, or elbow. Symptoms of this condition may include: Severe pain. Warmth. Swelling. Stiffness. Tenderness. The affected joint may be very painful to touch. Shiny, red, or purple skin. Chills and fever. Chronic gout may cause symptoms more frequently. More joints may be involved. You may also have white or yellow lumps (tophi) on your hands or feet or in other areas near your joints. How is this diagnosed? This condition is diagnosed based on your symptoms, medical history, and physical exam. You may have tests, such as: Blood tests to measure uric acid levels. Removal of joint fluid with a thin needle (aspiration) to look for uric acid crystals. X-rays to look for joint damage. How is this treated? Treatment for this condition has two phases: treating an acute attack and preventing future attacks. Acute gout treatment may include medicines to reduce pain and swelling, including: NSAIDs. Steroids. These are strong anti-inflammatory medicines that can be taken by mouth (orally) or injected into a joint. Colchicine. This medicine relieves pain and swelling when it is taken soon after an attack. It can be given by mouth or through an IV. Preventive treatment may include: Daily use of smaller doses of NSAIDs or  colchicine. Use of a medicine that reduces uric acid levels in your blood. Changes to your diet. You may need to see a dietitian about what to eat and drink to prevent gout. Follow these instructions at home: During a gout attack  If directed, put ice on the affected area: Put ice in a plastic bag. Place a towel between your skin and the bag. Leave the ice on for 20 minutes, 2-3 times a day. Raise (elevate) the affected joint above the level of your heart as often as possible. Rest the joint as much as possible. If the affected joint is in your leg, you may be given crutches to use. Follow instructions from your health care provider about eating or drinking restrictions.  Avoiding future gout attacks Follow a low-purine diet as told by your dietitian or health care provider. Avoid foods and drinks that are high in purines, including liver, kidney, anchovies, asparagus, herring, mushrooms, mussels, and beer. Maintain a healthy weight or lose weight if you are overweight. If you want to lose weight, talk  with your health care provider. It is important that you do not lose weight too quickly. Start or maintain an exercise program as told by your health care provider. Eating and drinking Drink enough fluids to keep your urine pale yellow. If you drink alcohol: Limit how much you use to: 0-1 drink a day for women. 0-2 drinks a day for men. Be aware of how much alcohol is in your drink. In the [place] one drink equals one 12 oz bottle of beer (355 mL) one 5 oz glass of wine (148 mL), or one 1 oz glass of hard liquor (44 mL). General instructions Take over-the-counter and prescription medicines only as told by your health care provider. Do not drive or use heavy machinery while taking prescription pain medicine. Return to your normal activities as told by your health care provider. Ask your health care provider what activities are safe for you. Keep all follow-up visits as told by your health  care provider. This is important. Contact a health care provider if you have: Another gout attack. Continuing symptoms of a gout attack after 10 days of treatment. Side effects from your medicines. Chills or a fever. Burning pain when you urinate. Pain in your lower back or belly. Get help right away if you: Have severe or uncontrolled pain. Cannot urinate. Summary Gout is painful swelling of the joints caused by inflammation. The most common site of pain is the big toe, but it can affect other joints in the body. Medicines and dietary changes can help to prevent and treat gout attacks. This information is not intended to replace advice given to you by your health care provider. Make sure you discuss any questions you have with your healthcare provider. Document Revised: 12/19/2017 Document Reviewed: 12/19/2017 Elsevier Patient Education  2022 [employer].

## 2020-12-22 NOTE — Progress Notes (Signed)
Subjective:    Patient ID: Robert [name], male    DOB: 1981-02-08, 40 y.o.   MRN: [id number]  Robert [name] is a 40 y.o. male presenting on 12/22/2020 for Gout   HPI  Suspected Acute Gout Right Great Toe Right Toe Pain Reports 1-2 week some mild aches and pain  Now recently acute flare up on this past Friday had R great toe with significant severe acute pain, with some swelling without redness, he rested, took Advil PRN and it gradually improved, even sore to touch. - No prior gout flares before. He admits some increased red meat, his mother is on keto diet and he has had more of that lately. Today improved overall - Denies any actual injury, trauma, other joint pain    Depression screen [facility] [place] 2/9 12/22/2020 11/13/2019 04/15/2019  Decreased Interest 2 0 0  Down, Depressed, Hopeless 1 0 0  PHQ - 2 Score 3 0 0  Altered sleeping 3 - -  Tired, decreased energy 1 - -  Change in appetite 1 - -  Feeling bad or failure about yourself  0 - -  Trouble concentrating 0 - -  Moving slowly or fidgety/restless 0 - -  Suicidal thoughts 0 - -  PHQ-9 Score 8 - -  Difficult doing work/chores Somewhat difficult - -  Some encounter information is confidential and restricted. Go to Review Flowsheets activity to see all data.    Social History   Tobacco Use   Smoking status: Former    Packs/day: 1.00    Years: 15.00    Pack years: 15.00    Types: Cigarettes    Quit date: 10/10/2016    Years since quitting: 4.2   Smokeless tobacco: Former   Tobacco comments:    Quit using vaping  Vaping Use   Vaping Use: Former   Quit date: 04/14/2018  Substance Use Topics   Alcohol use: No   Drug use: No    Review of Systems Per HPI unless specifically indicated above     Objective:    BP 126/83   Pulse 83   Ht 5\' 6"  (1.676 m)   Wt 238 lb 6.4 oz (108.1 kg)   SpO2 98%   BMI 38.48 kg/m   Wt Readings from Last 3 Encounters:  12/22/20 238 lb 6.4 oz (108.1 kg)  09/30/20 234 lb 6.4 oz (106.3 kg)   09/02/20 235 lb (106.6 kg)    Physical Exam Vitals and nursing note reviewed.  Constitutional:      General: He is not in acute distress.    Appearance: Normal appearance. He is well-developed. He is not diaphoretic.     Comments: Well-appearing, comfortable, cooperative  HENT:     Head: Normocephalic and atraumatic.  Eyes:     General:        Right eye: No discharge.        Left eye: No discharge.     Conjunctiva/sclera: Conjunctivae normal.  Cardiovascular:     Rate and Rhythm: Normal rate.  Pulmonary:     Effort: Pulmonary effort is normal.  Musculoskeletal:     Comments: Right Great Toe with MTP joint mild edema minimal erythema residual, mild tender to palpation has full range of motion.  Skin:    General: Skin is warm and dry.     Findings: No erythema or rash.  Neurological:     Mental Status: He is alert and oriented to person, place, and time.  Psychiatric:  Mood and Affect: Mood normal.        Behavior: Behavior normal.        Thought Content: Thought content normal.     Comments: Well groomed, good eye contact, normal speech and thoughts   Results for orders placed or performed in visit on 09/30/20  GC/Chlamydia probe amp ([place])not at [facility]  Result Value Ref Range   Neisseria Gonorrhea Negative    Chlamydia Negative    Comment Normal Reference Ranger Chlamydia - Negative    Comment      Normal Reference Range Neisseria Gonorrhea - Negative      Assessment & Plan:   Problem List Items Addressed This Visit   None Visit Diagnoses     Acute gout due to other secondary cause involving toe of right foot    -  Primary   Relevant Medications   predniSONE (DELTASONE) 20 MG tablet       Clinically consistent with acute gout flare of Right great toe MTP, onset 5 (days ago) with mprovement, tried nsaid No prior known history of gout before Differential Dx - unlikely trauma without known inciting injury, unlikely OA, no sign of infection or systemic  symptoms. No prior uric acid lvl  Plan: Rx prednisone taper 7 day if persistent pain or recurrence otherwise keep rx for future next flare May finish NSAID instead if gradually imjproving now Avoid excessive ambulation, relative rest, ice if helps, can take Tylenol PRN 4. Avoid food triggers (red meat, alcohol) Handout given  F/u in future if recurrence or if interested to do uric acid testing / preventative therapy options    Meds ordered this encounter  Medications   predniSONE (DELTASONE) 20 MG tablet    Sig: Take daily with food. Start with 60mg  (3 pills) x 2 days, then reduce to 40mg  (2 pills) x 2 days, then 20mg  (1 pill) x 3 days    Dispense:  13 tablet    Refill:  0      Follow up plan: Return if symptoms worsen or fail to improve, for gout.   , DO [facility] 12/22/2020, 11:03 AM

## 2020-12-28 ENCOUNTER — Other Ambulatory Visit: Payer: Self-pay | Admitting: *Deleted

## 2020-12-28 DIAGNOSIS — E291 Testicular hypofunction: Secondary | ICD-10-CM

## 2021-01-04 ENCOUNTER — Ambulatory Visit (INDEPENDENT_AMBULATORY_CARE_PROVIDER_SITE_OTHER): Payer: PRIVATE HEALTH INSURANCE | Admitting: Psychiatry

## 2021-01-04 ENCOUNTER — Other Ambulatory Visit: Payer: Self-pay

## 2021-01-04 ENCOUNTER — Encounter: Payer: Self-pay | Admitting: Psychiatry

## 2021-01-04 VITALS — BP 128/91 | HR 80 | Temp 98.0°F | Wt 240.0 lb

## 2021-01-04 DIAGNOSIS — G4701 Insomnia due to medical condition: Secondary | ICD-10-CM | POA: Diagnosis not present

## 2021-01-04 DIAGNOSIS — F3289 Other specified depressive episodes: Secondary | ICD-10-CM | POA: Insufficient documentation

## 2021-01-04 DIAGNOSIS — F411 Generalized anxiety disorder: Secondary | ICD-10-CM

## 2021-01-04 DIAGNOSIS — F32A Depression, unspecified: Secondary | ICD-10-CM

## 2021-01-04 MED ORDER — VENLAFAXINE HCL ER 37.5 MG PO CP24
37.5000 mg | ORAL_CAPSULE | Freq: Every day | ORAL | 0 refills | Status: DC
Start: 2021-01-04 — End: 2021-03-01

## 2021-01-04 NOTE — Progress Notes (Signed)
BH MD OP Progress Note  01/04/2021 4:37 PM Robert [name]  MRN:  [id number]  Chief Complaint:  Chief Complaint   Follow-up; Anxiety; Sleeping Problem    HPI: Robert [name] is a 40 year old Caucasian male, lives in [place], employed, has a history of GAD, insomnia, history of Tourette's disorder, gastroesophageal reflux disease was evaluated in office today.  Patient today reports he is currently struggling with feeling unmotivated during the day, spending a lot of time in bed, feeling sluggish, tired.  This has been going on since the past few days, getting worse since his vacation.  Patient reports it has been hard to get back into a routine with his work since then.  He also does not like working from home and it is trying to get back into the office if possible.  He reports he really wants to have the social interaction.  Patient reports he has been using the CPAP religiously.  He gets anywhere between 6 to 9 hours of sleep.  However in spite of that he feels tired during the day.  Unsure why.  He has not been exercising however is trying to find the right time to exercise on a daily basis.  He does have a gym membership and is interested in getting back there on a daily basis.  Patient reports appetite is fair.  He denies any suicidality, homicidality or perceptual disturbances.  Patient is compliant on his venlafaxine.  He reports he may have had muscular jerks when he lays down to sleep, this may have happened 3-4 times in the past month.  Not sure what could be contributing to this.  Patient denies any other concerns today.  Visit Diagnosis:    ICD-10-CM   1. GAD (generalized anxiety disorder)  F41.1 venlafaxine XR (EFFEXOR-XR) 37.5 MG 24 hr capsule    2. Insomnia due to medical condition  G47.01    mood , OSA    3. Depression, unspecified depression type  F32.A       Past Psychiatric History: Reviewed past psychiatric history from progress note on 09/29/2020.  Past trials  of Lexapro, Wellbutrin, BuSpar, venlafaxine  Past Medical History:  Past Medical History:  Diagnosis Date   ADHD    Anxiety    GERD (gastroesophageal reflux disease)    Wears contact lenses     Past Surgical History:  Procedure Laterality Date   ESOPHAGOGASTRODUODENOSCOPY (EGD) WITH PROPOFOL N/A 06/14/2017   Procedure: ESOPHAGOGASTRODUODENOSCOPY (EGD) WITH PROPOFOL;  Surgeon: Midge Minium, MD;  [facility];  Service: Endoscopy;  Laterality: N/A;   SEPTOPLASTY  06/13/2011   WISDOM TOOTH EXTRACTION      Family Psychiatric History: Reviewed family psychiatric history from progress note on 09/29/2020  Family History:  Family History  Problem Relation Age of Onset   Depression Mother    Suicidality Mother    Cancer Maternal Grandfather 62       stomach   Esophageal cancer Maternal Grandfather 61   Depression Sister    Colon cancer Neg Hx    Prostate cancer Neg Hx    Diabetes Neg Hx    Hypertension Neg Hx    Hypercholesterolemia Neg Hx     Social History: Reviewed social history from progress note on 09/29/2020 Social History   Socioeconomic History   Marital status: Divorced    Spouse name: Not on file   Number of children: 1   Years of education: Vocational   Highest education level: Not on file  Occupational History  Occupation: IT    Comment: Commuting to [employer]  Tobacco Use   Smoking status: Former    Packs/day: 1.00    Years: 15.00    Pack years: 15.00    Types: Cigarettes    Quit date: 10/10/2016    Years since quitting: 4.2   Smokeless tobacco: Former   Tobacco comments:    Quit using vaping  Vaping Use   Vaping Use: Former   Quit date: 04/14/2018  Substance and Sexual Activity   Alcohol use: No   Drug use: No   Sexual activity: Yes    Birth control/protection: Surgical    Comment: Vasectomy 2013  Other Topics Concern   Not on file  Social History Narrative   Not on file   Social Determinants of Health   Financial Resource  Strain: Not on file  Food Insecurity: Not on file  Transportation Needs: Not on file  Physical Activity: Not on file  Stress: Not on file  Social Connections: Not on file    Allergies: No Known Allergies  Metabolic Disorder Labs: Lab Results  Component Value Date   HGBA1C 5.8 (H) 09/22/2020   MPG 120 09/22/2020   MPG 114 02/21/2018   Lab Results  Component Value Date   PROLACTIN 8.9 05/16/2018   Lab Results  Component Value Date   CHOL 173 09/22/2020   TRIG 162 (H) 09/22/2020   HDL 40 09/22/2020   CHOLHDL 4.3 09/22/2020   LDLCALC 105 (H) 09/22/2020   LDLCALC 98 02/21/2018   Lab Results  Component Value Date   TSH 1.85 09/22/2020   TSH 1.04 02/21/2018    Therapeutic Level Labs: No results found for: LITHIUM No results found for: VALPROATE No components found for:  CBMZ  Current Medications: Current Outpatient Medications  Medication Sig Dispense Refill   chlorhexidine (PERIDEX) 0.12 % solution SMARTSIG:By Mouth     dicyclomine (BENTYL) 10 MG capsule Take 1 capsule (10 mg total) by mouth at bedtime as needed for spasms (cramping). 90 capsule 3   finasteride (PROPECIA) 1 MG tablet Take 1 tablet by mouth daily.     NEEDLE, DISP, 18 G (BD DISP NEEDLES) 18G X 1-1/2" MISC Use 18G to draw up Testosterone medication 50 each 0   NEEDLE, DISP, 21 G (BD ECLIPSE NEEDLE) 21G X 1-1/2" MISC Use 21G needle to inject Testosterone medication. 50 each 0   omeprazole (PRILOSEC) 40 MG capsule TAKE 1 CAPSULE(40 MG) BY MOUTH DAILY BEFORE BREAKFAST 90 capsule 2   predniSONE (DELTASONE) 20 MG tablet Take daily with food. Start with 60mg  (3 pills) x 2 days, then reduce to 40mg  (2 pills) x 2 days, then 20mg  (1 pill) x 3 days 13 tablet 0   propranolol (INDERAL) 10 MG tablet Take 1 tablet (10 mg total) by mouth 2 (two) times daily. 180 tablet 0   Syringe, Disposable, (2-3CC SYRINGE) 3 ML MISC Use as directed 100 each 0   testosterone cypionate (DEPOTESTOSTERONE CYPIONATE) 200 MG/ML injection  ADMINISTER 1 ML(200 MG) IN THE MUSCLE EVERY 14 DAYS 4 mL 3   venlafaxine XR (EFFEXOR-XR) 37.5 MG 24 hr capsule Take 1 capsule (37.5 mg total) by mouth daily with breakfast. Take along with 75 mg daily 90 capsule 0   venlafaxine XR (EFFEXOR-XR) 75 MG 24 hr capsule Take 1 capsule (75 mg total) by mouth daily. 90 capsule 0   No current facility-administered medications for this visit.     Musculoskeletal: Strength & Muscle Tone: within normal limits Gait & Station:  normal Patient leans: N/A  Psychiatric Specialty Exam: Review of Systems  Constitutional:  Positive for fatigue.  Musculoskeletal:        Muscle jerks at times - denies now  Psychiatric/Behavioral:  Positive for dysphoric mood and sleep disturbance.    Blood pressure (!) 128/91, pulse 80, temperature 98 F (36.7 C), weight 240 lb (108.9 kg).Body mass index is 38.74 kg/m.  General Appearance: Casual  Eye Contact:  Fair  Speech:  Clear and Coherent  Volume:  Normal  Mood:  Depressed  Affect:  Congruent  Thought Process:  Goal Directed and Descriptions of Associations: Intact  Orientation:  Full (Time, Place, and Person)  Thought Content: Logical   Suicidal Thoughts:  No  Homicidal Thoughts:  No  Memory:  Immediate;   Fair Recent;   Fair Remote;   Fair  Judgement:  Fair  Insight:  Fair  Psychomotor Activity:  Normal  Concentration:  Concentration: Fair and Attention Span: Fair  Recall:  [employer] of Knowledge: Fair  Language: Fair  Akathisia:  No  Handed:  Right  AIMS (if indicated): done  Assets:  Communication Skills Desire for Improvement Housing Talents/Skills Transportation Vocational/Educational  ADL's:  Intact  Cognition: WNL  Sleep:   Restless   Screenings: GAD-7    Flowsheet Row Office Visit from 01/04/2021 in [facility] Office Visit from 12/22/2020 in [facility] Video Visit from 11/03/2020 in [facility] Video Visit from  09/29/2020 in [facility] Office Visit from 11/13/2019 in [facility]  Total GAD-7 Score 3 4 7 3 3       PHQ2-9    Flowsheet Row Office Visit from 01/04/2021 in [facility] Office Visit from 12/22/2020 in [facility] Video Visit from 09/29/2020 in [facility] Office Visit from 11/13/2019 in [facility] Office Visit from 04/15/2019 in [place]  PHQ-2 Total Score 3 3 1  0 0  PHQ-9 Total Score 10 8 6  -- --      Flowsheet Row Office Visit from 01/04/2021 in [facility] Video Visit from 09/29/2020 in [facility]  C-SSRS RISK CATEGORY No Risk No Risk        Assessment and Plan: Robert [name] is a 40 year old divorced Caucasian male with history of generalized anxiety disorder was evaluated in office today.  Patient with psychosocial stressors of the pandemic, relationship struggle continues to struggle with sleep problems, possible mood symptoms, will benefit from the following plan.  Plan Generalized anxiety disorder-improving Continue CBT with [facility]. Continue venlafaxine as prescribed Propranolol 10 mg p.o. twice daily  Depressive disorder unspecified-unstable Will increase venlafaxine extended release to 112.5 mg p.o. daily Advised patient to either take venlafaxine as a divided dosage or take it earlier than evening time since it likely could be affecting his sleep. Patient to monitor for side effects including worsening of muscular jerks.  Insomnia-unstable Patient reports he is spending enough time sleeping however does not feel like he is getting enough sleep.  He is compliant on his CPAP. He will change his venlafaxine dosage time to see if that will help. Will monitor closely.  Discussed lifestyle changes, exercising more, weight loss.  Follow-up in clinic in 3 to 4  weeks or sooner if needed.  This note was generated in part or whole with voice recognition software. Voice recognition is usually quite accurate but there are transcription errors that  can and very often do occur. I apologize for any typographical errors that were not detected and corrected.      Jomarie Longs, MD 01/05/2021, 5:53 PM

## 2021-01-20 ENCOUNTER — Telehealth: Payer: BC Managed Care – PPO | Admitting: Psychiatry

## 2021-02-03 ENCOUNTER — Encounter: Payer: Self-pay | Admitting: Psychiatry

## 2021-02-03 ENCOUNTER — Telehealth (INDEPENDENT_AMBULATORY_CARE_PROVIDER_SITE_OTHER): Payer: PRIVATE HEALTH INSURANCE | Admitting: Psychiatry

## 2021-02-03 ENCOUNTER — Other Ambulatory Visit: Payer: Self-pay

## 2021-02-03 DIAGNOSIS — G4701 Insomnia due to medical condition: Secondary | ICD-10-CM | POA: Diagnosis not present

## 2021-02-03 DIAGNOSIS — F411 Generalized anxiety disorder: Secondary | ICD-10-CM | POA: Diagnosis not present

## 2021-02-03 DIAGNOSIS — F32A Depression, unspecified: Secondary | ICD-10-CM | POA: Diagnosis not present

## 2021-02-03 NOTE — Progress Notes (Signed)
Virtual Visit via Video Note  I connected with Robert [name] on 02/03/21 at  2:30 PM EDT by a video enabled telemedicine application and verified that I am speaking with the correct person using two identifiers.  Location Provider Location : [facility] Patient Location : Home  Participants: Patient , Provider   I discussed the limitations of evaluation and management by telemedicine and the availability of in person appointments. The patient expressed understanding and agreed to proceed.    I discussed the assessment and treatment plan with the patient. The patient was provided an opportunity to ask questions and all were answered. The patient agreed with the plan and demonstrated an understanding of the instructions.   The patient was advised to call back or seek an in-person evaluation if the symptoms worsen or if the condition fails to improve as anticipated.    BH MD OP Progress Note  02/03/2021 6:06 PM Deric Bocock  MRN:  [id number]  Chief Complaint:  Chief Complaint   Follow-up; Depression; Anxiety    HPI: Robert [name] is a 40 year old Caucasian male, lives in [place], employed, has a history of GAD, insomnia, history of Tourette's disorder, gastroesophageal reflux disease was evaluated by telemedicine today.  Patient today reports he is currently tolerating the higher dose of venlafaxine as well.  He reports since being on the higher dosage his mood symptoms have improved.  He does not feel as tired and reports his motivation has improved.  He was feeling unmotivated during the day and was spending a lot of time in bed and feeling sluggish and tired prior to this change.  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  He reports that also could be helping him to feel more motivated.  He continues to have sleep problems however his sleep issues are more due to his sleep hygiene.  He is currently working on his sleep hygiene.  He uses  CPAP every night and that does help him.  Patient denies any suicidality or homicidality.  Patient denies any perceptual disturbances.  Patient denies any other concerns today.  Visit Diagnosis:    ICD-10-CM   1. GAD (generalized anxiety disorder)  F41.1     2. Insomnia due to medical condition  G47.01    mood OSA    3. Depression, unspecified depression type  F32.A       Past Psychiatric History: Reviewed past psychiatric history from progress note on 09/29/2020.  Past trials of Lexapro, Wellbutrin, BuSpar, venlafaxine  Past Medical History:  Past Medical History:  Diagnosis Date   ADHD    Anxiety    GERD (gastroesophageal reflux disease)    Wears contact lenses     Past Surgical History:  Procedure Laterality Date   ESOPHAGOGASTRODUODENOSCOPY (EGD) WITH PROPOFOL N/A 06/14/2017   Procedure: ESOPHAGOGASTRODUODENOSCOPY (EGD) WITH PROPOFOL;  Surgeon: Midge Minium, MD;  Location: [facility];  Service: Endoscopy;  Laterality: N/A;   SEPTOPLASTY  06/13/2011   WISDOM TOOTH EXTRACTION      Family Psychiatric History: Reviewed family psychiatric history from progress note on 09/29/2020  Family History:  Family History  Problem Relation Age of Onset   Depression Mother    Suicidality Mother    Cancer Maternal Grandfather 70       stomach   Esophageal cancer Maternal Grandfather 72   Depression Sister    Colon cancer Neg Hx    Prostate cancer Neg Hx    Diabetes  Neg Hx    Hypertension Neg Hx    Hypercholesterolemia Neg Hx     Social History: Reviewed social history from progress note on 09/29/2020 Social History   Socioeconomic History   Marital status: Divorced    Spouse name: Not on file   Number of children: 1   Years of education: Vocational   Highest education level: Not on file  Occupational History   Occupation: IT    Comment: Commuting to [employer]  Tobacco Use   Smoking status: Former    Packs/day: 1.00    Years: 15.00    Pack years: 15.00     Types: Cigarettes    Quit date: 10/10/2016    Years since quitting: 4.3   Smokeless tobacco: Former   Tobacco comments:    Quit using vaping  Vaping Use   Vaping Use: Former   Quit date: 04/14/2018  Substance and Sexual Activity   Alcohol use: No   Drug use: No   Sexual activity: Yes    Birth control/protection: Surgical    Comment: Vasectomy 2013  Other Topics Concern   Not on file  Social History Narrative   Not on file   Social Determinants of Health   Financial Resource Strain: Not on file  Food Insecurity: Not on file  Transportation Needs: Not on file  Physical Activity: Not on file  Stress: Not on file  Social Connections: Not on file    Allergies: No Known Allergies  Metabolic Disorder Labs: Lab Results  Component Value Date   HGBA1C 5.8 (H) 09/22/2020   MPG 120 09/22/2020   MPG 114 02/21/2018   Lab Results  Component Value Date   PROLACTIN 8.9 05/16/2018   Lab Results  Component Value Date   CHOL 173 09/22/2020   TRIG 162 (H) 09/22/2020   HDL 40 09/22/2020   CHOLHDL 4.3 09/22/2020   LDLCALC 105 (H) 09/22/2020   LDLCALC 98 02/21/2018   Lab Results  Component Value Date   TSH 1.85 09/22/2020   TSH 1.04 02/21/2018    Therapeutic Level Labs: No results found for: LITHIUM No results found for: VALPROATE No components found for:  CBMZ  Current Medications: Current Outpatient Medications  Medication Sig Dispense Refill   chlorhexidine (PERIDEX) 0.12 % solution SMARTSIG:By Mouth     dicyclomine (BENTYL) 10 MG capsule Take 1 capsule (10 mg total) by mouth at bedtime as needed for spasms (cramping). 90 capsule 3   finasteride (PROPECIA) 1 MG tablet Take 1 tablet by mouth daily.     NEEDLE, DISP, 18 G (BD DISP NEEDLES) 18G X 1-1/2" MISC Use 18G to draw up Testosterone medication 50 each 0   NEEDLE, DISP, 21 G (BD ECLIPSE NEEDLE) 21G X 1-1/2" MISC Use 21G needle to inject Testosterone medication. 50 each 0   omeprazole (PRILOSEC) 40 MG capsule TAKE 1  CAPSULE(40 MG) BY MOUTH DAILY BEFORE BREAKFAST (Patient not taking: Reported on 02/03/2021) 90 capsule 2   predniSONE (DELTASONE) 20 MG tablet Take daily with food. Start with 60mg  (3 pills) x 2 days, then reduce to 40mg  (2 pills) x 2 days, then 20mg  (1 pill) x 3 days 13 tablet 0   propranolol (INDERAL) 10 MG tablet Take 1 tablet (10 mg total) by mouth 2 (two) times daily. 180 tablet 0   Syringe, Disposable, (2-3CC SYRINGE) 3 ML MISC Use as directed 100 each 0   testosterone cypionate (DEPOTESTOSTERONE CYPIONATE) 200 MG/ML injection ADMINISTER 1 ML(200 MG) IN THE MUSCLE EVERY 14 DAYS 4  mL 3   venlafaxine XR (EFFEXOR-XR) 37.5 MG 24 hr capsule Take 1 capsule (37.5 mg total) by mouth daily with breakfast. Take along with 75 mg daily 90 capsule 0   venlafaxine XR (EFFEXOR-XR) 75 MG 24 hr capsule Take 1 capsule (75 mg total) by mouth daily. 90 capsule 0   No current facility-administered medications for this visit.     Musculoskeletal: Strength & Muscle Tone:  UTA Gait & Station: normal Patient leans: N/A  Psychiatric Specialty Exam: Review of Systems  Psychiatric/Behavioral:  The patient is nervous/anxious.   All other systems reviewed and are negative.  There were no vitals taken for this visit.There is no height or weight on file to calculate BMI.  General Appearance: Casual  Eye Contact:  Fair  Speech:  Clear and Coherent  Volume:  Normal  Mood:  Anxious improving  Affect:  Congruent  Thought Process:  Goal Directed and Descriptions of Associations: Intact  Orientation:  Full (Time, Place, and Person)  Thought Content: Logical   Suicidal Thoughts:  No  Homicidal Thoughts:  No  Memory:  Immediate;   Fair Recent;   Fair Remote;   Fair  Judgement:  Fair  Insight:  Fair  Psychomotor Activity:  Normal  Concentration:  Concentration: Fair and Attention Span: Fair  Recall:  Fair  Fund of Knowledge: Good  Language: Fair  Akathisia:  No  Handed:  Right  AIMS (if indicated): done   Assets:  Communication Skills Desire for Improvement Housing Social Support Talents/Skills Transportation Vocational/Educational  ADL's:  Intact  Cognition: WNL  Sleep:  Fair   Screenings: GAD-7    Flowsheet Row Video Visit from 02/03/2021 in [facility] Office Visit from 01/04/2021 in [facility] Office Visit from 12/22/2020 in [facility] Video Visit from 11/03/2020 in [facility] Video Visit from 09/29/2020 in [facility]  Total GAD-7 Score 3 3 4 7 3       PHQ2-9    Flowsheet Row Video Visit from 02/03/2021 in [facility] Office Visit from 01/04/2021 in [facility] Office Visit from 12/22/2020 in [facility] Video Visit from 09/29/2020 in [facility] Office Visit from 11/13/2019 in [place]  PHQ-2 Total Score 0 3 3 1  0  PHQ-9 Total Score 3 10 8 6  --      Flowsheet Row Office Visit from 01/04/2021 in [facility] Video Visit from 09/29/2020 in [facility]  C-SSRS RISK CATEGORY No Risk No Risk        Assessment and Plan: Muscab Brenneman is a 40 year old divorced, Caucasian male with history of generalized anxiety disorder was evaluated by telemedicine today.  Patient with psychosocial stressors of the pandemic, relationship struggles, currently struggles improving on the medication.  Plan as noted below.  Plan Generalized anxiety disorder-stable Venlafaxine extended release 112.5 mg p.o. daily Propranolol 10 mg p.o. twice daily  Depressive disorder unspecified-improving Venlafaxine extended release 112.5 mg p.o. daily   Insomnia-improving Continue sleep hygiene techniques Continue CPAP  Follow-up in clinic in 8 weeks or sooner if needed.  This note was generated in part or  whole with voice recognition software. Voice recognition is usually quite accurate but there are transcription errors that can and very often do occur. I apologize for any typographical errors that were not detected and corrected.     [facility], MD 02/03/2021, 6:06 PM

## 2021-03-01 ENCOUNTER — Telehealth: Payer: Self-pay

## 2021-03-01 DIAGNOSIS — F411 Generalized anxiety disorder: Secondary | ICD-10-CM

## 2021-03-01 MED ORDER — PROPRANOLOL HCL 10 MG PO TABS: 10.0000 mg | ORAL_TABLET | Freq: Two times a day (BID) | ORAL | 0 refills | Status: DC

## 2021-03-01 MED ORDER — VENLAFAXINE HCL ER 75 MG PO CP24: 75.0000 mg | ORAL_CAPSULE | Freq: Every day | ORAL | 0 refills | Status: DC

## 2021-03-01 MED ORDER — VENLAFAXINE HCL ER 37.5 MG PO CP24: 37.5000 mg | ORAL_CAPSULE | Freq: Every day | ORAL | 0 refills | Status: DC

## 2021-03-01 NOTE — Telephone Encounter (Signed)
I have sent Effexor to pharmacy. 

## 2021-03-01 NOTE — Telephone Encounter (Signed)
received fax that pt needs refills.  looks as if pt should have enough until 10-12th but he will not have enough to get to his next appt.

## 2021-03-08 ENCOUNTER — Other Ambulatory Visit: Payer: Self-pay

## 2021-03-08 ENCOUNTER — Other Ambulatory Visit: Payer: BC Managed Care – PPO

## 2021-03-08 DIAGNOSIS — E291 Testicular hypofunction: Secondary | ICD-10-CM | POA: Diagnosis not present

## 2021-03-09 ENCOUNTER — Encounter: Payer: Self-pay | Admitting: *Deleted

## 2021-03-09 LAB — TESTOSTERONE: Testosterone: 523 ng/dL (ref 264–916)

## 2021-03-09 LAB — HEMATOCRIT: Hematocrit: 49.4 % (ref 37.5–51.0)

## 2021-03-10 ENCOUNTER — Encounter: Payer: Self-pay | Admitting: Urology

## 2021-03-10 ENCOUNTER — Other Ambulatory Visit: Payer: Self-pay | Admitting: Urology

## 2021-03-11 ENCOUNTER — Other Ambulatory Visit: Payer: Self-pay | Admitting: Family Medicine

## 2021-03-11 MED ORDER — TESTOSTERONE CYPIONATE 200 MG/ML IM SOLN: INTRAMUSCULAR | 3 refills | Status: DC

## 2021-03-28 ENCOUNTER — Encounter: Payer: Self-pay | Admitting: Family Medicine

## 2021-03-28 ENCOUNTER — Encounter: Payer: Self-pay | Admitting: Urology

## 2021-03-29 ENCOUNTER — Other Ambulatory Visit: Payer: Self-pay | Admitting: Urology

## 2021-03-29 DIAGNOSIS — E291 Testicular hypofunction: Secondary | ICD-10-CM

## 2021-03-29 MED ORDER — CLOMIPHENE CITRATE 50 MG PO TABS
ORAL_TABLET | ORAL | 3 refills | Status: DC
Start: 2021-03-29 — End: 2021-05-04

## 2021-04-01 ENCOUNTER — Other Ambulatory Visit: Payer: Self-pay

## 2021-04-01 ENCOUNTER — Encounter: Payer: Self-pay | Admitting: Family Medicine

## 2021-04-01 ENCOUNTER — Ambulatory Visit: Payer: BC Managed Care – PPO | Admitting: Family Medicine

## 2021-04-01 VITALS — Ht 66.0 in | Wt 239.0 lb

## 2021-04-01 DIAGNOSIS — Z114 Encounter for screening for human immunodeficiency virus [HIV]: Secondary | ICD-10-CM

## 2021-04-01 DIAGNOSIS — Z113 Encounter for screening for infections with a predominantly sexual mode of transmission: Secondary | ICD-10-CM

## 2021-04-01 NOTE — Progress Notes (Signed)
Subjective:    Patient ID: Robert [name], male    DOB: 12/29/1980, 40 y.o.   MRN: [id number]  Robert [name] is a 40 y.o. male presenting on 04/01/2021 for No chief complaint on file.   HPI  Here for STD Screening labs He is asymptomatic. He is single and sexually active He has had prior screening done negative. Requests blood and will return for urine test.   Depression screen Robert [name] 2/9 04/01/2021 12/22/2020 11/13/2019  Decreased Interest 1 2 0  Down, Depressed, Hopeless 0 1 0  PHQ - 2 Score 1 3 0  Altered sleeping 2 3 -  Tired, decreased energy 1 1 -  Change in appetite 0 1 -  Feeling bad or failure about yourself  0 0 -  Trouble concentrating 1 0 -  Moving slowly or fidgety/restless 0 0 -  Suicidal thoughts 0 0 -  PHQ-9 Score 5 8 -  Difficult doing work/chores Not difficult at all Somewhat difficult -  Some encounter information is confidential and restricted. Go to Review Flowsheets activity to see all data.    Social History   Tobacco Use   Smoking status: Former    Packs/day: 1.00    Years: 15.00    Pack years: 15.00    Types: Cigarettes    Quit date: 10/10/2016    Years since quitting: 4.4   Smokeless tobacco: Former   Tobacco comments:    Quit using vaping  Vaping Use   Vaping Use: Former   Quit date: 04/14/2018  Substance Use Topics   Alcohol use: No   Drug use: No    Review of Systems Per HPI unless specifically indicated above     Objective:    Ht 5\' 6"  (1.676 m)   Wt 239 lb (108.4 kg)   BMI 38.58 kg/m   Wt Readings from Last 3 Encounters:  04/01/21 239 lb (108.4 kg)  12/22/20 238 lb 6.4 oz (108.1 kg)  09/30/20 234 lb 6.4 oz (106.3 kg)    Physical Exam Vitals and nursing note reviewed.  Constitutional:      General: He is not in acute distress.    Appearance: Normal appearance. He is well-developed. He is not diaphoretic.     Comments: Well-appearing, comfortable, cooperative  HENT:     Head: Normocephalic and atraumatic.  Eyes:      General:        Right eye: No discharge.        Left eye: No discharge.     Conjunctiva/sclera: Conjunctivae normal.  Cardiovascular:     Rate and Rhythm: Normal rate.  Pulmonary:     Effort: Pulmonary effort is normal.  Skin:    General: Skin is warm and dry.     Findings: No erythema or rash.  Neurological:     Mental Status: He is alert and oriented to person, place, and time.  Psychiatric:        Mood and Affect: Mood normal.        Behavior: Behavior normal.        Thought Content: Thought content normal.     Comments: Well groomed, good eye contact, normal speech and thoughts   Results for orders placed or performed in visit on 03/08/21  Hematocrit  Result Value Ref Range   Hematocrit 49.4 37.5 - 51.0 %  Testosterone  Result Value Ref Range   Testosterone 523 264 - 916 ng/dL      Assessment & Plan:   Problem List Items  Addressed This Visit   None Visit Diagnoses     Screening for STD (sexually transmitted disease)    -  Primary   Relevant Orders   HIV Antibody (routine testing w rflx)   RPR   C. trachomatis/N. gonorrhoeae RNA   Screening for HIV (human immunodeficiency virus)       Relevant Orders   HIV Antibody (routine testing w rflx)       Asymptomatic STD Screening Labs ordered today HIV RPR Return for urine next week, 1st or 2nd void for AM GC Chlamydia screen   No orders of the defined types were placed in this encounter.     Follow up plan: Return in about 3 months (around 07/02/2021) for 1 week 10/26 for Lab only (Urine Test), 3 month fasting lab only then 1 week later Annual Physical.   ORDER LABS FOR PHYSICAL AFTER URINE TEST NEXT WEEK  Saralyn Pilar, DO [facility] 04/01/2021, 4:24 PM

## 2021-04-01 NOTE — Patient Instructions (Addendum)
Thank you for coming to the office today.  Urine next week, takes 3-5 days for result.  Blood today, result in 24 hours approximately.  Please schedule a Follow-up Appointment to: Return in about 3 months (around 07/02/2021) for 1 week 10/26 for Lab only (Urine Test), 3 month fasting lab only then 1 week later Annual Physical.  If you have any other questions or concerns, please feel free to call the office or send a message through MyChart. You may also schedule an earlier appointment if necessary.  Additionally, you may be receiving a survey about your experience at our office within a few days to 1 week by e-mail or mail. We value your feedback.  Saralyn Pilar, DO [facility] [place]

## 2021-04-04 LAB — HIV ANTIBODY (ROUTINE TESTING W REFLEX): HIV 1&2 Ab, 4th Generation: NONREACTIVE

## 2021-04-04 LAB — RPR: RPR Ser Ql: NONREACTIVE

## 2021-04-05 ENCOUNTER — Other Ambulatory Visit: Payer: Self-pay

## 2021-04-05 DIAGNOSIS — Z113 Encounter for screening for infections with a predominantly sexual mode of transmission: Secondary | ICD-10-CM

## 2021-04-06 ENCOUNTER — Other Ambulatory Visit: Payer: BC Managed Care – PPO

## 2021-04-06 DIAGNOSIS — Z113 Encounter for screening for infections with a predominantly sexual mode of transmission: Secondary | ICD-10-CM | POA: Diagnosis not present

## 2021-04-07 ENCOUNTER — Telehealth (INDEPENDENT_AMBULATORY_CARE_PROVIDER_SITE_OTHER): Payer: PRIVATE HEALTH INSURANCE | Admitting: Psychiatry

## 2021-04-07 ENCOUNTER — Other Ambulatory Visit: Payer: Self-pay

## 2021-04-07 ENCOUNTER — Encounter: Payer: Self-pay | Admitting: Psychiatry

## 2021-04-07 DIAGNOSIS — G4701 Insomnia due to medical condition: Secondary | ICD-10-CM

## 2021-04-07 DIAGNOSIS — F32A Depression, unspecified: Secondary | ICD-10-CM

## 2021-04-07 DIAGNOSIS — F411 Generalized anxiety disorder: Secondary | ICD-10-CM

## 2021-04-07 LAB — C. TRACHOMATIS/N. GONORRHOEAE RNA
C. trachomatis RNA, TMA: NOT DETECTED
N. gonorrhoeae RNA, TMA: NOT DETECTED

## 2021-04-07 MED ORDER — PROPRANOLOL HCL 10 MG PO TABS: 10.0000 mg | ORAL_TABLET | Freq: Three times a day (TID) | ORAL | 0 refills | Status: DC | PRN

## 2021-04-07 NOTE — Progress Notes (Signed)
Virtual Visit via Video Note  I connected with Robert [name] on 04/07/21 at  1:00 PM EDT by a video enabled telemedicine application and verified that I am speaking with the correct person using two identifiers. Location Provider Location : [facility] Patient Location : Home  Participants: Patient , Provider   I discussed the limitations of evaluation and management by telemedicine and the availability of in person appointments. The patient expressed understanding and agreed to proceed.    I discussed the assessment and treatment plan with the patient. The patient was provided an opportunity to ask questions and all were answered. The patient agreed with the plan and demonstrated an understanding of the instructions.   The patient was advised to call back or seek an in-person evaluation if the symptoms worsen or if the condition fails to improve as anticipated.    BH MD OP Progress Note  04/07/2021 3:02 PM Mustapha Colson  MRN:  [id number]  Chief Complaint:  Chief Complaint   Follow-up; Anxiety; Depression    HPI: Robert [name] is a 40 year old Caucasian male, lives in [place], employed, has a history of GAD, insomnia, history of Tourette's disorder, gastroesophageal reflux disease was evaluated by telemedicine today.  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  His girlfriend has been staying with him more frequently while her house is being completed.  That has been stressful although he has been trying to cope.  He reports the propranolol does help to some extent however is agreeable to increasing the dosage.  Patient denies any sadness or lack of motivation, reports the past few weeks he has been more motivated to do things.  Denies suicidality, homicidality or perceptual disturbances.  Patient reports sleep is overall okay, continues to use CPAP.  He has been getting around 8 hours of sleep on  average.  Patient denies any suicidality, homicidality or perceptual disturbances.  Patient is compliant on venlafaxine, denies side effects.  Patient denies any other concerns today.  Visit Diagnosis:    ICD-10-CM   1. GAD (generalized anxiety disorder)  F41.1 propranolol (INDERAL) 10 MG tablet    2. Insomnia due to medical condition  G47.01    mood, OSA    3. Depression, unspecified depression type  F32.A       Past Psychiatric History: Reviewed past psychiatric history from progress note from 09/29/2020.  Past trials of Lexapro, Wellbutrin, BuSpar, venlafaxine  Past Medical History:  Past Medical History:  Diagnosis Date   ADHD    Anxiety    GERD (gastroesophageal reflux disease)    Wears contact lenses     Past Surgical History:  Procedure Laterality Date   ESOPHAGOGASTRODUODENOSCOPY (EGD) WITH PROPOFOL N/A 06/14/2017   Procedure: ESOPHAGOGASTRODUODENOSCOPY (EGD) WITH PROPOFOL;  Surgeon: Midge Minium, MD;  Location: [facility];  Service: Endoscopy;  Laterality: N/A;   SEPTOPLASTY  06/13/2011   WISDOM TOOTH EXTRACTION      Family Psychiatric History: Reviewed family psychiatric history from progress note on 09/29/2020  Family History:  Family History  Problem Relation Age of Onset   Depression Mother    Suicidality Mother    Cancer Maternal Grandfather 64       stomach   Esophageal cancer Maternal Grandfather 68   Depression Sister    Colon cancer Neg Hx    Prostate cancer Neg Hx    Diabetes Neg Hx    Hypertension Neg Hx    Hypercholesterolemia Neg Hx  Social History: Reviewed social history from progress note on 09/29/2020 Social History   Socioeconomic History   Marital status: Divorced    Spouse name: Not on file   Number of children: 1   Years of education: Vocational   Highest education level: Not on file  Occupational History   Occupation: IT    Comment: Commuting to [employer]  Tobacco Use   Smoking status: Former    Packs/day:  1.00    Years: 15.00    Pack years: 15.00    Types: Cigarettes    Quit date: 10/10/2016    Years since quitting: 4.4   Smokeless tobacco: Former   Tobacco comments:    Quit using vaping  Vaping Use   Vaping Use: Former   Quit date: 04/14/2018  Substance and Sexual Activity   Alcohol use: No   Drug use: No   Sexual activity: Yes    Birth control/protection: Surgical    Comment: Vasectomy 2013  Other Topics Concern   Not on file  Social History Narrative   Not on file   Social Determinants of Health   Financial Resource Strain: Not on file  Food Insecurity: Not on file  Transportation Needs: Not on file  Physical Activity: Not on file  Stress: Not on file  Social Connections: Not on file    Allergies: No Known Allergies  Metabolic Disorder Labs: Lab Results  Component Value Date   HGBA1C 5.8 (H) 09/22/2020   MPG 120 09/22/2020   MPG 114 02/21/2018   Lab Results  Component Value Date   PROLACTIN 8.9 05/16/2018   Lab Results  Component Value Date   CHOL 173 09/22/2020   TRIG 162 (H) 09/22/2020   HDL 40 09/22/2020   CHOLHDL 4.3 09/22/2020   LDLCALC 105 (H) 09/22/2020   LDLCALC 98 02/21/2018   Lab Results  Component Value Date   TSH 1.85 09/22/2020   TSH 1.04 02/21/2018    Therapeutic Level Labs: No results found for: LITHIUM No results found for: VALPROATE No components found for:  CBMZ  Current Medications: Current Outpatient Medications  Medication Sig Dispense Refill   chlorhexidine (PERIDEX) 0.12 % solution SMARTSIG:By Mouth     clomiPHENE (CLOMID) 50 MG tablet Take 1/2 tablet daily 30 tablet 3   dicyclomine (BENTYL) 10 MG capsule Take 1 capsule (10 mg total) by mouth at bedtime as needed for spasms (cramping). 90 capsule 3   finasteride (PROPECIA) 1 MG tablet Take 1 tablet by mouth daily.     NEEDLE, DISP, 18 G (BD DISP NEEDLES) 18G X 1-1/2" MISC Use 18G to draw up Testosterone medication 50 each 0   NEEDLE, DISP, 21 G (BD ECLIPSE NEEDLE) 21G X  1-1/2" MISC Use 21G needle to inject Testosterone medication. 50 each 0   omeprazole (PRILOSEC) 40 MG capsule TAKE 1 CAPSULE(40 MG) BY MOUTH DAILY BEFORE BREAKFAST (Patient not taking: Reported on 04/07/2021) 90 capsule 2   predniSONE (DELTASONE) 20 MG tablet Take daily with food. Start with 60mg  (3 pills) x 2 days, then reduce to 40mg  (2 pills) x 2 days, then 20mg  (1 pill) x 3 days 13 tablet 0   propranolol (INDERAL) 10 MG tablet Take 1 tablet (10 mg total) by mouth 3 (three) times daily as needed. For severe anxiety attacks 270 tablet 0   Syringe, Disposable, (2-3CC SYRINGE) 3 ML MISC Use as directed 100 each 0   testosterone cypionate (DEPOTESTOSTERONE CYPIONATE) 200 MG/ML injection ADMINISTER 1 ML(200 MG) IN THE MUSCLE EVERY 14  DAYS 4 mL 3   venlafaxine XR (EFFEXOR-XR) 37.5 MG 24 hr capsule Take 1 capsule (37.5 mg total) by mouth daily with breakfast. Take along with 75 mg daily 90 capsule 0   venlafaxine XR (EFFEXOR-XR) 75 MG 24 hr capsule Take 1 capsule (75 mg total) by mouth daily. 90 capsule 0   No current facility-administered medications for this visit.     Musculoskeletal: Strength & Muscle Tone:  UTA Gait & Station:  Seated Patient leans: N/A  Psychiatric Specialty Exam: Review of Systems  Psychiatric/Behavioral:  The patient is nervous/anxious.   All other systems reviewed and are negative.  There were no vitals taken for this visit.There is no height or weight on file to calculate BMI.  General Appearance: Casual  Eye Contact:  Good  Speech:  Clear and Coherent  Volume:  Normal  Mood:  Anxious coping well  Affect:  Congruent  Thought Process:  Goal Directed and Descriptions of Associations: Intact  Orientation:  Full (Time, Place, and Person)  Thought Content: Logical   Suicidal Thoughts:  No  Homicidal Thoughts:  No  Memory:  Immediate;   Fair Recent;   Fair Remote;   Fair  Judgement:  Fair  Insight:  Fair  Psychomotor Activity:  Normal  Concentration:   Concentration: Fair and Attention Span: Fair  Recall:  [employer] of Knowledge: Fair  Language: Fair  Akathisia:  No  Handed:  Right  AIMS (if indicated): done  Assets:  Communication Skills Desire for Improvement Resilience Social Support Talents/Skills Transportation  ADL's:  Intact  Cognition: WNL  Sleep:   improving   Screenings: GAD-7    Flowsheet Row Office Visit from 04/01/2021 in [facility] Video Visit from 02/03/2021 in [facility] Office Visit from 01/04/2021 in [facility] Office Visit from 12/22/2020 in [facility] Video Visit from 11/03/2020 in [facility]  Total GAD-7 Score 6 3 3 4 7       PHQ2-9    Flowsheet Row Video Visit from 04/07/2021 in [facility] Office Visit from 04/01/2021 in [facility] Video Visit from 02/03/2021 in [facility] Office Visit from 01/04/2021 in [facility] Office Visit from 12/22/2020 in [place]  PHQ-2 Total Score 1 1 0 3 3  PHQ-9 Total Score 4 5 3 10 8       Flowsheet Row Office Visit from 01/04/2021 in [facility] Video Visit from 09/29/2020 in [facility]  C-SSRS RISK CATEGORY No Risk No Risk        Assessment and Plan: Urban Naval is a 40 year old Caucasian male, has a history of generalized anxiety disorder, was evaluated by telemedicine today.  Patient is currently struggling with situational stressors which does have an impact on his anxiety, will benefit from the following plan.  Plan GAD-unstable Increase propranolol to 10 mg p.o. 3 times daily as needed Venlafaxine extended release 112.5 mg p.o. daily  Depressive disorder unspecified-stable Venlafaxine extended release 112.5 mg p.o. daily  Insomnia-stable Continue  CPAP Continue sleep hygiene techniques  Follow-up in clinic in 3 months or sooner if needed.  This note was generated in part or whole with voice recognition software. Voice recognition is usually quite accurate but there are transcription errors that can and very often do occur. I apologize for any typographical errors that were not detected and corrected.       Vallie Teters  Satia Winger, MD 04/07/2021, 3:02 PM

## 2021-05-02 ENCOUNTER — Other Ambulatory Visit: Payer: Self-pay

## 2021-05-02 DIAGNOSIS — E291 Testicular hypofunction: Secondary | ICD-10-CM

## 2021-05-03 ENCOUNTER — Other Ambulatory Visit: Payer: BC Managed Care – PPO

## 2021-05-03 ENCOUNTER — Other Ambulatory Visit: Payer: Self-pay

## 2021-05-03 DIAGNOSIS — E291 Testicular hypofunction: Secondary | ICD-10-CM

## 2021-05-04 ENCOUNTER — Telehealth: Payer: Self-pay | Admitting: Urology

## 2021-05-04 ENCOUNTER — Encounter: Payer: Self-pay | Admitting: *Deleted

## 2021-05-04 LAB — TESTOSTERONE: Testosterone: 148 ng/dL — ABNORMAL LOW (ref 264–916)

## 2021-05-04 NOTE — Telephone Encounter (Signed)
Testosterone level was low at 148.  When was last injection in relation to his blood draw?

## 2021-05-09 ENCOUNTER — Encounter: Payer: Self-pay | Admitting: Family Medicine

## 2021-05-15 ENCOUNTER — Telehealth: Payer: Self-pay | Admitting: Urology

## 2021-05-15 MED ORDER — TESTOSTERONE CYPIONATE 200 MG/ML IM SOLN: 300.0000 mg | INTRAMUSCULAR | 0 refills | Status: DC

## 2021-05-15 NOTE — Telephone Encounter (Signed)
Rx testosterone 300mg  (1.5cc)every 2 weeks sent to pharmacy.  Please schedule lab visit for midcycle testosterone level in 6 weeks

## 2021-05-16 ENCOUNTER — Encounter: Payer: Self-pay | Admitting: Family Medicine

## 2021-05-16 NOTE — Telephone Encounter (Signed)
LMOM informed patient medication was sent to the pharmacy. Lab appointment has been made and if he needs to change this appointment he is to return call.

## 2021-05-24 ENCOUNTER — Encounter: Payer: Self-pay | Admitting: Urology

## 2021-06-07 ENCOUNTER — Telehealth: Payer: Self-pay

## 2021-06-07 DIAGNOSIS — F411 Generalized anxiety disorder: Secondary | ICD-10-CM

## 2021-06-07 MED ORDER — VENLAFAXINE HCL ER 37.5 MG PO CP24
37.5000 mg | ORAL_CAPSULE | Freq: Every day | ORAL | 0 refills | Status: DC
Start: 2021-06-07 — End: 2021-08-31

## 2021-06-07 MED ORDER — VENLAFAXINE HCL ER 75 MG PO CP24
75.0000 mg | ORAL_CAPSULE | Freq: Every day | ORAL | 0 refills | Status: DC
Start: 2021-06-07 — End: 2021-08-31

## 2021-06-07 NOTE — Telephone Encounter (Signed)
I have sent venlafaxine extended release to [facility].

## 2021-06-07 NOTE — Telephone Encounter (Signed)
Medication refill - Fax from [facility] for a refill of pt's prescribed Venlafaxine XR 75 mg.  90 day order last provided 03/01/21 and patient returns next on 04/30/22.

## 2021-06-27 ENCOUNTER — Encounter: Payer: Self-pay | Admitting: Family Medicine

## 2021-06-28 ENCOUNTER — Other Ambulatory Visit: Payer: Self-pay | Admitting: Family Medicine

## 2021-06-28 DIAGNOSIS — Z113 Encounter for screening for infections with a predominantly sexual mode of transmission: Secondary | ICD-10-CM

## 2021-06-28 DIAGNOSIS — Z0000 Encounter for general adult medical examination without abnormal findings: Secondary | ICD-10-CM

## 2021-06-28 DIAGNOSIS — E781 Pure hyperglyceridemia: Secondary | ICD-10-CM

## 2021-06-28 DIAGNOSIS — R7309 Other abnormal glucose: Secondary | ICD-10-CM

## 2021-06-28 DIAGNOSIS — Z114 Encounter for screening for human immunodeficiency virus [HIV]: Secondary | ICD-10-CM

## 2021-06-28 DIAGNOSIS — Z7251 High risk heterosexual behavior: Secondary | ICD-10-CM

## 2021-06-29 DIAGNOSIS — G4733 Obstructive sleep apnea (adult) (pediatric): Secondary | ICD-10-CM | POA: Diagnosis not present

## 2021-06-30 ENCOUNTER — Other Ambulatory Visit: Payer: Self-pay

## 2021-06-30 ENCOUNTER — Telehealth (INDEPENDENT_AMBULATORY_CARE_PROVIDER_SITE_OTHER): Payer: PRIVATE HEALTH INSURANCE | Admitting: Psychiatry

## 2021-06-30 ENCOUNTER — Encounter: Payer: Self-pay | Admitting: Psychiatry

## 2021-06-30 DIAGNOSIS — Z114 Encounter for screening for human immunodeficiency virus [HIV]: Secondary | ICD-10-CM

## 2021-06-30 DIAGNOSIS — R7309 Other abnormal glucose: Secondary | ICD-10-CM

## 2021-06-30 DIAGNOSIS — F411 Generalized anxiety disorder: Secondary | ICD-10-CM

## 2021-06-30 DIAGNOSIS — E781 Pure hyperglyceridemia: Secondary | ICD-10-CM

## 2021-06-30 DIAGNOSIS — Z0000 Encounter for general adult medical examination without abnormal findings: Secondary | ICD-10-CM

## 2021-06-30 DIAGNOSIS — G4701 Insomnia due to medical condition: Secondary | ICD-10-CM

## 2021-06-30 DIAGNOSIS — Z7251 High risk heterosexual behavior: Secondary | ICD-10-CM

## 2021-06-30 DIAGNOSIS — Z113 Encounter for screening for infections with a predominantly sexual mode of transmission: Secondary | ICD-10-CM

## 2021-06-30 MED ORDER — DOXEPIN HCL 10 MG PO CAPS
10.0000 mg | ORAL_CAPSULE | Freq: Every evening | ORAL | 1 refills | Status: DC | PRN
Start: 2021-06-30 — End: 2021-08-31

## 2021-06-30 NOTE — Progress Notes (Signed)
Virtual Visit via Video Note  I connected with Robert [name] on 06/30/21 at  1:20 PM EST by a video enabled telemedicine application and verified that I am speaking with the correct person using two identifiers.  Location Provider Location : [facility] Patient Location : Home  Participants: Patient , Provider   I discussed the limitations of evaluation and management by telemedicine and the availability of in person appointments. The patient expressed understanding and agreed to proceed.   I discussed the assessment and treatment plan with the patient. The patient was provided an opportunity to ask questions and all were answered. The patient agreed with the plan and demonstrated an understanding of the instructions.   The patient was advised to call back or seek an in-person evaluation if the symptoms worsen or if the condition fails to improve as anticipated.   BH MD OP Progress Note  07/01/2021 1:40 PM Robert [name]  MRN:  [id number]  Chief Complaint:  Chief Complaint   Follow-up, 41 year old Caucasian male, with history of generalized anxiety disorder, insomnia presents with worsening anxiety and sleep problems.    HPI: Robert [name] is a 41 year old Caucasian male, lives in [place], employed, has a history of GAD, insomnia, history of Tourette's disorder, gastroesophageal reflux disease was evaluated by telemedicine today.  Patient reports he started getting more and more anxious as the holidays started.  Patient reports his anxiety got worse in November and December.  Reports now that he is in January and is currently back at work anxiety symptoms more manageable.  He continues to take the venlafaxine which helps.  He reports he may have skipped it by mistake here and there and that did cause some withdrawal symptoms.  He hence has been more compliant.  Patient reports sleep problems since the past few weeks.  It started during the holidays.  He was on a break during the holiday and  that also did not help since his sleep hygiene was disrupted.  Now he is trying to get back on a sleep routine however continues to have difficulty falling asleep.  Patient has tried melatonin and that did not help.  Does not use a lot of caffeine.  Reports he tries to switch of devices prior to bedtime.  Has tried other relaxation techniques and that has not helped.  Reports he is compliant on CPAP.  Denies suicidality, homicidality or perceptual disturbances.  Patient denies any other concerns today.  Visit Diagnosis:    ICD-10-CM   1. GAD (generalized anxiety disorder)  F41.1     2. Insomnia due to medical condition  G47.01 doxepin (SINEQUAN) 10 MG capsule      Past Psychiatric History: Reviewed past psychiatric history from progress note on 09/29/2020.  Past trials of Lexapro, Wellbutrin, BuSpar, venlafaxine.  Past Medical History:  Past Medical History:  Diagnosis Date   ADHD    Anxiety    GERD (gastroesophageal reflux disease)    Wears contact lenses     Past Surgical History:  Procedure Laterality Date   ESOPHAGOGASTRODUODENOSCOPY (EGD) WITH PROPOFOL N/A 06/14/2017   Procedure: ESOPHAGOGASTRODUODENOSCOPY (EGD) WITH PROPOFOL;  Surgeon: Midge Minium, MD;  Location: [facility];  Service: Endoscopy;  Laterality: N/A;   SEPTOPLASTY  06/13/2011   WISDOM TOOTH EXTRACTION      Family Psychiatric History: Reviewed family psychiatric history from progress note on 09/29/2020.  Family History:  Family History  Problem Relation Age of Onset   Depression Mother    Suicidality Mother    Cancer  Maternal Grandfather 35       stomach   Esophageal cancer Maternal Grandfather 19   Depression Sister    Colon cancer Neg Hx    Prostate cancer Neg Hx    Diabetes Neg Hx    Hypertension Neg Hx    Hypercholesterolemia Neg Hx     Social History: Reviewed social history from progress note on 09/29/2020. Social History   Socioeconomic History   Marital status: Divorced    Spouse  name: Not on file   Number of children: 1   Years of education: Vocational   Highest education level: Not on file  Occupational History   Occupation: IT    Comment: Commuting to [employer]  Tobacco Use   Smoking status: Former    Packs/day: 1.00    Years: 15.00    Pack years: 15.00    Types: Cigarettes    Quit date: 10/10/2016    Years since quitting: 4.7   Smokeless tobacco: Former   Tobacco comments:    Quit using vaping  Vaping Use   Vaping Use: Former   Quit date: 04/14/2018  Substance and Sexual Activity   Alcohol use: No   Drug use: No   Sexual activity: Yes    Birth control/protection: Surgical    Comment: Vasectomy 2013  Other Topics Concern   Not on file  Social History Narrative   Not on file   Social Determinants of Health   Financial Resource Strain: Not on file  Food Insecurity: Not on file  Transportation Needs: Not on file  Physical Activity: Not on file  Stress: Not on file  Social Connections: Not on file    Allergies: No Known Allergies  Metabolic Disorder Labs: Lab Results  Component Value Date   HGBA1C 5.8 (H) 09/22/2020   MPG 120 09/22/2020   MPG 114 02/21/2018   Lab Results  Component Value Date   PROLACTIN 8.9 05/16/2018   Lab Results  Component Value Date   CHOL 173 09/22/2020   TRIG 162 (H) 09/22/2020   HDL 40 09/22/2020   CHOLHDL 4.3 09/22/2020   LDLCALC 105 (H) 09/22/2020   LDLCALC 98 02/21/2018   Lab Results  Component Value Date   TSH 1.85 09/22/2020   TSH 1.04 02/21/2018    Therapeutic Level Labs: No results found for: LITHIUM No results found for: VALPROATE No components found for:  CBMZ  Current Medications: Current Outpatient Medications  Medication Sig Dispense Refill   doxepin (SINEQUAN) 10 MG capsule Take 1-2 capsules (10-20 mg total) by mouth at bedtime as needed. SLEEP 60 capsule 1   chlorhexidine (PERIDEX) 0.12 % solution SMARTSIG:By Mouth     dicyclomine (BENTYL) 10 MG capsule Take 1 capsule (10 mg  total) by mouth at bedtime as needed for spasms (cramping). 90 capsule 3   finasteride (PROPECIA) 1 MG tablet Take 1 tablet by mouth daily.     NEEDLE, DISP, 18 G (BD DISP NEEDLES) 18G X 1-1/2" MISC Use 18G to draw up Testosterone medication 50 each 0   NEEDLE, DISP, 21 G (BD ECLIPSE NEEDLE) 21G X 1-1/2" MISC Use 21G needle to inject Testosterone medication. 50 each 0   predniSONE (DELTASONE) 20 MG tablet Take daily with food. Start with  (3 pills) x 2 days, then reduce to  (2 pills) x 2 days, then  (1 pill) x 3 days 13 tablet 0   propranolol (INDERAL) 10 MG tablet Take 1 tablet (10 mg total) by mouth 3 (three) times daily as  needed. For severe anxiety attacks 270 tablet 1   Syringe, Disposable, (2-3CC SYRINGE) 3 ML MISC Use as directed 100 each 0   testosterone cypionate (DEPOTESTOSTERONE CYPIONATE) 200 MG/ML injection Inject 1.5 mLs (300 mg total) into the muscle every 14 (fourteen) days. 6 mL 0   venlafaxine XR (EFFEXOR-XR) 37.5 MG 24 hr capsule Take 1 capsule (37.5 mg total) by mouth daily with breakfast. Take along with 75 mg daily 90 capsule 0   venlafaxine XR (EFFEXOR-XR) 75 MG 24 hr capsule Take 1 capsule (75 mg total) by mouth daily. 90 capsule 0   No current facility-administered medications for this visit.     Musculoskeletal: Strength & Muscle Tone:  UTA Gait & Station: normal Patient leans: N/A  Psychiatric Specialty Exam: Review of Systems  Psychiatric/Behavioral:  Positive for sleep disturbance. The patient is nervous/anxious.   All other systems reviewed and are negative.  There were no vitals taken for this visit.There is no height or weight on file to calculate BMI.  General Appearance: Casual  Eye Contact:  Fair  Speech:  Clear and Coherent  Volume:  Normal  Mood:  Anxious  Affect:  Congruent  Thought Process:  Goal Directed and Descriptions of Associations: Intact  Orientation:  Full (Time, Place, and Person)  Thought Content: Logical   Suicidal  Thoughts:  No  Homicidal Thoughts:  No  Memory:  Immediate;   Fair Recent;   Fair Remote;   Fair  Judgement:  Fair  Insight:  Fair  Psychomotor Activity:  Normal  Concentration:  Concentration: Fair and Attention Span: Fair  Recall:  [employer] of Knowledge: Fair  Language: Fair  Akathisia:  No  Handed:  Right  AIMS (if indicated): not done  Assets:  Communication Skills Desire for Improvement Housing Social Support Talents/Skills Transportation  ADL's:  Intact  Cognition: WNL  Sleep:  Poor   Screenings: GAD-7    Flowsheet Row Video Visit from 06/30/2021 in [facility] Office Visit from 04/01/2021 in [facility] Video Visit from 02/03/2021 in [facility] Office Visit from 01/04/2021 in [facility] Office Visit from 12/22/2020 in [facility]  Total GAD-7 Score 3 6 3 3 4       PHQ2-9    Flowsheet Row Video Visit from 06/30/2021 in [facility] Video Visit from 04/07/2021 in [facility] Office Visit from 04/01/2021 in [facility] Video Visit from 02/03/2021 in [facility] Office Visit from 01/04/2021 in [facility]  PHQ-2 Total Score 1 1 1  0 3  PHQ-9 Total Score -- 4 5 3 10       Flowsheet Row Video Visit from 06/30/2021 in [facility] Office Visit from 01/04/2021 in [facility] Video Visit from 09/29/2020 in [facility]  C-SSRS RISK CATEGORY No Risk No Risk No Risk        Assessment and Plan: Robert [name] is a 41 year old Caucasian male who has a history of generalized anxiety disorder was evaluated by telemedicine today.  Patient is currently struggling with sleep problems as well as anxiety symptoms, will benefit from the following  plan.  Plan GAD-improving Continue venlafaxine extended release 112.5 mg p.o. daily.  We will consider dosage increase in the future as needed. Continue propranolol 10 mg p.o. 3 times daily as needed.  Insomnia-unstable Continue CPAP Continue sleep hygiene techniques Start doxepin 10 to 20 mg p.o.  nightly as needed Provided medication education including serotonin syndrome, drug to drug interaction with venlafaxine.  Follow-up in clinic in 2 to 3 weeks or sooner if needed.  This note was generated in part or whole with voice recognition software. Voice recognition is usually quite accurate but there are transcription errors that can and very often do occur. I apologize for any typographical errors that were not detected and corrected.      Robert Longs, MD 07/01/2021, 1:40 PM

## 2021-06-30 NOTE — Patient Instructions (Signed)
Doxepin Capsules What is this medication? DOXEPIN (DOX e pin) treats depression and anxiety. It increases the amount of serotonin and norepinephrine in the brain, hormones that help regulate mood. It belongs to a group of medications called tricyclic antidepressants (TCAs). This medicine may be used for other purposes; ask your health care provider or pharmacist if you have questions. COMMON BRAND NAME(S): Sinequan What should I tell my care team before I take this medication? They need to know if you have any of these conditions: Bipolar disorder Difficulty passing urine Glaucoma Heart disease If you frequently drink alcohol containing drinks Liver disease Lung or breathing disease, like asthma or sleep apnea Prostate trouble Schizophrenia Seizures Suicidal thoughts, plans, or attempt; a previous suicide attempt by you or a family member An unusual or allergic reaction to doxepin, other medications, foods, dyes, or preservatives Pregnant or trying to get pregnant Breast-feeding How should I use this medication? Take this medication by mouth with a glass of water. Follow the directions on the prescription label. Take your doses at regular intervals. Do not take your medication more often than directed. Do not stop taking this medication suddenly except upon the advice of your care team. Stopping this medication too quickly may cause serious side effects or your condition may worsen. A special MedGuide will be given to you by the pharmacist with each prescription and refill. Be sure to read this information carefully each time. Talk to your care team about the use of this medication in children. While this medication may be prescribed for children as young as 12 years for selected conditions, precautions do apply. Overdosage: If you think you have taken too much of this medicine contact a poison control center or emergency room at once. NOTE: This medicine is only for you. Do not share this  medicine with others. What if I miss a dose? If you miss a dose, take it as soon as you can. If it is almost time for your next dose, take only that dose. Do not take double or extra doses. What may interact with this medication? Do not take this medication with any of the following: Arsenic trioxide Certain medications used to regulate abnormal heartbeat or to treat other heart conditions Cisapride Halofantrine Levomethadyl Linezolid MAOIs like Carbex, Eldepryl, Marplan, Nardil, and Parnate Methylene blue Other medications for mental depression Phenothiazines like perphenazine, thioridazine and chlorpromazine Pimozide Procarbazine Sparfloxacin St. John's Wort This medication may also interact with the following: Cimetidine Tolazamide Ziprasidone This list may not describe all possible interactions. Give your health care provider a list of all the medicines, herbs, non-prescription drugs, or dietary supplements you use. Also tell them if you smoke, drink alcohol, or use illegal drugs. Some items may interact with your medicine. What should I watch for while using this medication? Visit your care team for regular checks on your progress. It can take several days before you feel the full effect of this medication. If you have been taking this medication regularly for some time, do not suddenly stop taking it. You must gradually reduce the dose or you may get severe side effects. Ask your care team for advice. Even after you stop taking this medication it can still affect your body for several days. Patients and their families should watch out for new or worsening thoughts of suicide or depression. Also watch out for sudden changes in feelings such as feeling anxious, agitated, panicky, irritable, hostile, aggressive, impulsive, severely restless, overly excited and hyperactive, or not being able   to sleep. If this happens, especially at the beginning of treatment or after a change in dose,  call your care team. You may get drowsy or dizzy. Do not drive, use machinery, or do anything that needs mental alertness until you know how this medication affects you. Do not stand or sit up quickly, especially if you are an older patient. This reduces the risk of dizzy or fainting spells. Alcohol may increase dizziness and drowsiness. Avoid alcoholic drinks. Do not treat yourself for coughs, colds, or allergies without asking your care team for advice. Some ingredients can increase possible side effects. Your mouth may get dry. Chewing sugarless gum or sucking hard candy, and drinking plenty of water may help. Contact your care team if the problem does not go away or is severe. This medication may cause dry eyes and blurred vision. If you wear contact lenses you may feel some discomfort. Lubricating drops may help. See your care team if the problem does not go away or is severe. This medication can make you more sensitive to the sun. Keep out of the sun. If you cannot avoid being in the sun, wear protective clothing and use sunscreen. Do not use sun lamps or tanning beds/booths. What side effects may I notice from receiving this medication? Side effects that you should report to your care team as soon as possible: Allergic reactions--skin rash, itching, hives, swelling of the face, lips, tongue, or throat Irritability, confusion, fast or irregular heartbeat, muscle stiffness, twitching muscles, sweating, high fever, seizure, chills, vomiting, diarrhea, which may be signs of serotonin syndrome Sudden eye pain or change in vision such as blurry vision, seeing halos around lights, vision loss Thoughts of suicide or self-harm, worsening mood, or feelings of depression Trouble passing urine Side effects that usually do not require medical attention (report to your care team if they continue or are bothersome): Change in sex drive or performance Constipation Dizziness Drowsiness Dry  mouth Tremors Weight gain This list may not describe all possible side effects. Call your doctor for medical advice about side effects. You may report side effects to FDA at 1-800-FDA-1088. Where should I keep my medication? Keep out of the reach of children. Store at room temperature between 15 and 30 degrees C (59 and 86 degrees F). Throw away any unused medication after the expiration date. NOTE: This sheet is a summary. It may not cover all possible information. If you have questions about this medicine, talk to your doctor, pharmacist, or health care provider.  2022 Elsevier/Gold Standard (2020-09-02 00:00:00)  

## 2021-07-01 ENCOUNTER — Other Ambulatory Visit: Payer: BC Managed Care – PPO

## 2021-07-01 ENCOUNTER — Telehealth: Payer: Self-pay

## 2021-07-01 ENCOUNTER — Other Ambulatory Visit: Payer: Self-pay | Admitting: Urology

## 2021-07-01 DIAGNOSIS — Z113 Encounter for screening for infections with a predominantly sexual mode of transmission: Secondary | ICD-10-CM | POA: Diagnosis not present

## 2021-07-01 DIAGNOSIS — Z114 Encounter for screening for human immunodeficiency virus [HIV]: Secondary | ICD-10-CM | POA: Diagnosis not present

## 2021-07-01 DIAGNOSIS — Z7251 High risk heterosexual behavior: Secondary | ICD-10-CM | POA: Diagnosis not present

## 2021-07-01 DIAGNOSIS — E781 Pure hyperglyceridemia: Secondary | ICD-10-CM | POA: Diagnosis not present

## 2021-07-01 DIAGNOSIS — F411 Generalized anxiety disorder: Secondary | ICD-10-CM

## 2021-07-01 MED ORDER — PROPRANOLOL HCL 10 MG PO TABS: 10.0000 mg | ORAL_TABLET | Freq: Three times a day (TID) | ORAL | 1 refills | Status: DC | PRN

## 2021-07-01 NOTE — Telephone Encounter (Signed)
I have sent propranolol to pharmacy. 

## 2021-07-01 NOTE — Telephone Encounter (Signed)
received a request for propranolol 10mg 

## 2021-07-04 LAB — LIPID PANEL
Cholesterol: 185 mg/dL (ref <200)
HDL: 38 mg/dL — ABNORMAL LOW (ref >=40)
LDL Cholesterol (Calc): 111 mg/dL (calc) — ABNORMAL HIGH
Non-HDL Cholesterol (Calc): 147 mg/dL (calc) — ABNORMAL HIGH (ref <130)
Total CHOL/HDL Ratio: 4.9 (calc) (ref <5.0)
Triglycerides: 236 mg/dL — ABNORMAL HIGH (ref <150)

## 2021-07-04 LAB — COMPLETE METABOLIC PANEL WITH GFR
AG Ratio: 1.6 (calc) (ref 1.0–2.5)
ALT: 41 U/L (ref 9–46)
AST: 26 U/L (ref 10–40)
Albumin: 4.7 g/dL (ref 3.6–5.1)
Alkaline phosphatase (APISO): 59 U/L (ref 36–130)
BUN: 15 mg/dL (ref 7–25)
CO2: 30 mmol/L (ref 20–32)
Calcium: 9.9 mg/dL (ref 8.6–10.3)
Chloride: 101 mmol/L (ref 98–110)
Creat: 1.22 mg/dL (ref 0.60–1.26)
Globulin: 2.9 g/dL (calc) (ref 1.9–3.7)
Glucose, Bld: 94 mg/dL (ref 65–99)
Potassium: 4.2 mmol/L (ref 3.5–5.3)
Sodium: 139 mmol/L (ref 135–146)
Total Bilirubin: 0.6 mg/dL (ref 0.2–1.2)
Total Protein: 7.6 g/dL (ref 6.1–8.1)
eGFR: 77 mL/min/{1.73_m2} (ref >=60)

## 2021-07-04 LAB — CBC WITH DIFFERENTIAL/PLATELET
Absolute Monocytes: 795 cells/uL (ref 200–950)
Basophils Absolute: 60 cells/uL (ref 0–200)
Basophils Relative: 0.8 %
Eosinophils Absolute: 368 cells/uL (ref 15–500)
Eosinophils Relative: 4.9 %
HCT: 53.4 % — ABNORMAL HIGH (ref 38.5–50.0)
Hemoglobin: 17.5 g/dL — ABNORMAL HIGH (ref 13.2–17.1)
Lymphs Abs: 1995 cells/uL (ref 850–3900)
MCH: 28.4 pg (ref 27.0–33.0)
MCHC: 32.8 g/dL (ref 32.0–36.0)
MCV: 86.5 fL (ref 80.0–100.0)
MPV: 9.9 fL (ref 7.5–12.5)
Monocytes Relative: 10.6 %
Neutro Abs: 4283 cells/uL (ref 1500–7800)
Neutrophils Relative %: 57.1 %
Platelets: 237 10*3/uL (ref 140–400)
RBC: 6.17 10*6/uL — ABNORMAL HIGH (ref 4.20–5.80)
RDW: 14.4 % (ref 11.0–15.0)
Total Lymphocyte: 26.6 %
WBC: 7.5 10*3/uL (ref 3.8–10.8)

## 2021-07-04 LAB — C. TRACHOMATIS/N. GONORRHOEAE RNA
C. trachomatis RNA, TMA: NOT DETECTED
N. gonorrhoeae RNA, TMA: NOT DETECTED

## 2021-07-04 LAB — HEMOGLOBIN A1C
Hgb A1c MFr Bld: 5.9 % of total Hgb — ABNORMAL HIGH (ref <5.7)
Mean Plasma Glucose: 123 mg/dL
eAG (mmol/L): 6.8 mmol/L

## 2021-07-04 LAB — TSH: TSH: 1.91 mIU/L (ref 0.40–4.50)

## 2021-07-04 LAB — HIV ANTIBODY (ROUTINE TESTING W REFLEX): HIV 1&2 Ab, 4th Generation: NONREACTIVE

## 2021-07-04 LAB — RPR: RPR Ser Ql: NONREACTIVE

## 2021-07-04 MED ORDER — TESTOSTERONE CYPIONATE 200 MG/ML IM SOLN: 300.0000 mg | INTRAMUSCULAR | 0 refills | Status: DC

## 2021-07-08 ENCOUNTER — Encounter: Payer: Self-pay | Admitting: Family Medicine

## 2021-07-08 ENCOUNTER — Other Ambulatory Visit: Payer: Self-pay

## 2021-07-08 ENCOUNTER — Ambulatory Visit (INDEPENDENT_AMBULATORY_CARE_PROVIDER_SITE_OTHER): Payer: BC Managed Care – PPO | Admitting: Family Medicine

## 2021-07-08 ENCOUNTER — Other Ambulatory Visit: Payer: BC Managed Care – PPO

## 2021-07-08 VITALS — BP 122/70 | HR 108 | Temp 97.3°F | Ht 66.0 in | Wt 237.0 lb

## 2021-07-08 DIAGNOSIS — R7309 Other abnormal glucose: Secondary | ICD-10-CM

## 2021-07-08 DIAGNOSIS — R718 Other abnormality of red blood cells: Secondary | ICD-10-CM

## 2021-07-08 DIAGNOSIS — R7303 Prediabetes: Secondary | ICD-10-CM

## 2021-07-08 DIAGNOSIS — Z0000 Encounter for general adult medical examination without abnormal findings: Secondary | ICD-10-CM

## 2021-07-08 DIAGNOSIS — E291 Testicular hypofunction: Secondary | ICD-10-CM

## 2021-07-08 NOTE — Progress Notes (Signed)
Subjective:    Patient ID: Robert [name], male    DOB: [birth year], 41 y.o.   MRN: [id number]  Robert [name] is a 41 y.o. male presenting on 07/08/2021 for Annual Exam   HPI  Here for Annual Physical and Lab Review.   Morbid Obesity BMI >38 Pre-Diabetes Hyperlipidemia  A1c 5.9 Admits increased carb intake and wt gain goal to improve these, increased cravings in PM. LDL 111 from 105, the TG up to 236 from 162  Lifestyle: - Weight about 1 lb up in past 4-5 months - Diet: More balanced meals, breakfast higher protein, he is trying to work on intermittent fasting regimen, Drinks sweet tea often, admits goal to limit this in future. He is working from home, eating differently - Exercise:    GERD Abdominal Cramping / Bloating Improved on Dicyclomine in PM for abdominal digestive symptoms and cramping. Usually taking in PM. Needs refill   Generalized Anxiety Disorder Insomnia Followed by Dr Robert [name] Psychiatry. Previously w/ Dr Robert [name] Has tapered off Escitalopram. Now on new rx Venlafaxine 6m XR daily   OSA, on CPAP - Patient reports prior history of dx OSA and on CPAP for years, prior to treatment initial symptoms were snoring, daytime sleepiness and fatigue, has had several sleep studies in the past. - Today reports that sleep apnea is well controlled. He uses the CPAP machine every night. Tolerates the machine well, and thinks that sleeps better with it and feels good. No new concerns or symptoms.     Health Maintenance: STD Screening done, negative, except needs urine GC Chlamydia, will check today.  [facility] 04/01/2021 12/22/2020  Decreased Interest _0 Down, Depressed, Hopeless 1 0 1  PHQ - 2 Score _1 Altered sleeping _2 Tired, decreased energy _3 Change in appetite 0 0 1  Feeling bad or failure about yourself  0 0 0  Trouble concentrating 1 1 0  Moving slowly or fidgety/restless 0 0 0  Suicidal thoughts 0 0 0  PHQ-9  Score _4 Difficult doing work/chores Not difficult at all Not difficult at all Somewhat difficult  Some encounter information is confidential and restricted. Go to Review Flowsheets activity to see all data.  Some recent data might be hidden    Past Medical History:  Diagnosis Date   ADHD    Anxiety    GERD (gastroesophageal reflux disease)    Wears contact lenses    Past Surgical History:  Procedure Laterality Date   ESOPHAGOGASTRODUODENOSCOPY (EGD) WITH PROPOFOL N/A 06/14/2017   Procedure: ESOPHAGOGASTRODUODENOSCOPY (EGD) WITH PROPOFOL;  Surgeon: WLucilla Lame MD;  [place] [facility]  Service: Endoscopy;  Laterality: N/A;   SEPTOPLASTY  06/13/2011   WISDOM TOOTH EXTRACTION     Social History   Socioeconomic History   Marital status: Divorced    Spouse name: Not on file   Number of children: 1   Years of education: Vocational   Highest education level: Not on file  Occupational History   Occupation: IT    Comment: Commuting to [employer] Tobacco Use   Smoking status: Former    Packs/day: 1.00    Years: 15.00    Pack years: 15.00    Types: Cigarettes    Quit date: 10/10/2016    Years since quitting: 4.7   Smokeless tobacco: Former   Tobacco comments:    Quit using [employer]  Use: Former   Quit date: 04/14/2018  Substance and Sexual Activity   Alcohol use: No   Drug use: No   Sexual activity: Yes    Birth control/protection: Surgical    Comment: Vasectomy 2013  Other Topics Concern   Not on file  Social History Narrative   Not on file   Social Determinants of Health   Financial Resource Strain: Not on file  Food Insecurity: Not on file  Transportation Needs: Not on file  Physical Activity: Not on file  Stress: Not on file  Social Connections: Not on file  Intimate Partner Violence: Not on file   Family History  Problem Relation Age of Onset   Depression Mother    Suicidality Mother    Cancer Maternal Grandfather 2        stomach   Esophageal cancer Maternal Grandfather 25   Depression Sister    Colon cancer Neg Hx    Prostate cancer Neg Hx    Diabetes Neg Hx    Hypertension Neg Hx    Hypercholesterolemia Neg Hx    Current Outpatient Medications on File Prior to Visit  Medication Sig   chlorhexidine (PERIDEX) 0.12 % solution SMARTSIG:By Mouth   dicyclomine (BENTYL) 10 MG capsule Take 1 capsule (10 mg total) by mouth at bedtime as needed for spasms (cramping).   doxepin (SINEQUAN) 10 MG capsule Take 1-2 capsules (10-20 mg total) by mouth at bedtime as needed. SLEEP   NEEDLE, DISP, 18 G (BD DISP NEEDLES) 18G X 1-1/2" MISC Use 18G to draw up Testosterone medication   NEEDLE, DISP, 21 G (BD ECLIPSE NEEDLE) 21G X 1-1/2" MISC Use 21G needle to inject Testosterone medication.   propranolol (INDERAL) 10 MG tablet Take 1 tablet (10 mg total) by mouth 3 (three) times daily as needed. For severe anxiety attacks   Syringe, Disposable, (2-3CC SYRINGE) 3 ML MISC Use as directed   testosterone cypionate (DEPOTESTOSTERONE CYPIONATE) 200 MG/ML injection Inject 1.5 mLs (300 mg total) into the muscle every 14 (fourteen) days.   venlafaxine XR (EFFEXOR-XR) 37.5 MG 24 hr capsule Take 1 capsule (37.5 mg total) by mouth daily with breakfast. Take along with 75 mg daily   venlafaxine XR (EFFEXOR-XR) 75 MG 24 hr capsule Take 1 capsule (75 mg total) by mouth daily.   finasteride (PROPECIA) 1 MG tablet Take 1 tablet by mouth daily. (Patient not taking: Reported on 07/08/2021)   No current facility-administered medications on file prior to visit.    Review of Systems  Constitutional:  Negative for activity change, appetite change, chills, diaphoresis, fatigue and fever.  HENT:  Negative for congestion and hearing loss.   Eyes:  Negative for visual disturbance.  Respiratory:  Negative for cough, chest tightness, shortness of breath and wheezing.   Cardiovascular:  Negative for chest pain, palpitations and leg swelling.   Gastrointestinal:  Negative for abdominal pain, constipation, diarrhea, nausea and vomiting.  Genitourinary:  Negative for dysuria, frequency and hematuria.  Musculoskeletal:  Negative for arthralgias and neck pain.  Skin:  Negative for rash.  Neurological:  Negative for dizziness, weakness, light-headedness, numbness and headaches.  Hematological:  Negative for adenopathy.  Psychiatric/Behavioral:  Negative for behavioral problems, dysphoric mood and sleep disturbance.   Per HPI unless specifically indicated above      Objective:    BP 122/70 (BP Location: Left Arm, Cuff Size: Normal)    Pulse (!) 108    Temp (!) 97.3 F (36.3 C) (Temporal)    Ht _0  (  1.676 m)    Wt 237 lb (107.5 kg)    SpO2 98%    BMI 38.25 kg/m   Wt Readings from Last 3 Encounters:  07/08/21 237 lb (107.5 kg)  04/01/21 239 lb (108.4 kg)  12/22/20 238 lb 6.4 oz (108.1 kg)    Physical Exam Vitals and nursing note reviewed.  Constitutional:      General: He is not in acute distress.    Appearance: He is well-developed. He is obese. He is not diaphoretic.     Comments: Well-appearing, comfortable, cooperative  HENT:     Head: Normocephalic and atraumatic.  Eyes:     General:        Right eye: No discharge.        Left eye: No discharge.     Conjunctiva/sclera: Conjunctivae normal.  Neck:     Thyroid: No thyromegaly.  Cardiovascular:     Rate and Rhythm: Normal rate and regular rhythm.     Pulses: Normal pulses.     Heart sounds: Normal heart sounds. No murmur heard. Pulmonary:     Effort: Pulmonary effort is normal. No respiratory distress.     Breath sounds: Normal breath sounds. No wheezing or rales.  Musculoskeletal:        General: Normal range of motion.     Cervical back: Normal range of motion and neck supple.     Right lower leg: No edema.     Left lower leg: No edema.  Lymphadenopathy:     Cervical: No cervical adenopathy.  Skin:    General: Skin is warm and dry.     Findings: No  erythema or rash.  Neurological:     Mental Status: He is alert and oriented to person, place, and time. Mental status is at baseline.  Psychiatric:        Behavior: Behavior normal.     Comments: Well groomed, good eye contact, normal speech and thoughts      Results for orders placed or performed in visit on 06/30/21  C. trachomatis/N. gonorrhoeae RNA   Specimen: Urine  Result Value Ref Range   C. trachomatis RNA, TMA NOT DETECTED NOT DETECTED   N. gonorrhoeae RNA, TMA NOT DETECTED NOT DETECTED  TSH  Result Value Ref Range   TSH 1.91 0.40 - 4.50 mIU/L  RPR  Result Value Ref Range   RPR Ser Ql NON-REACTIVE NON-REACTIVE  HIV Antibody (routine testing w rflx)  Result Value Ref Range   HIV 1&2 Ab, 4th Generation NON-REACTIVE NON-REACTIVE  Hemoglobin A1c  Result Value Ref Range   Hgb A1c MFr Bld 5.9 (H) <5.7 % of total Hgb   Mean Plasma Glucose 123 mg/dL   eAG (mmol/L) 6.8 mmol/L  CBC with Differential/Platelet  Result Value Ref Range   WBC 7.5 3.8 - 10.8 Thousand/uL   RBC 6.17 (H) 4.20 - 5.80 Million/uL   Hemoglobin 17.5 (H) 13.2 - 17.1 g/dL   HCT 53.4 (H) 38.5 - 50.0 %   MCV 86.5 80.0 - 100.0 fL   MCH 28.4 27.0 - 33.0 pg   MCHC 32.8 32.0 - 36.0 g/dL   RDW 14.4 11.0 - 15.0 %   Platelets 237 140 - 400 Thousand/uL   MPV 9.9 7.5 - 12.5 fL   Neutro Abs 4,283 1,500 - 7,800 cells/uL   Lymphs Abs 1,995 850 - 3,900 cells/uL   Absolute Monocytes 795 200 - 950 cells/uL   Eosinophils Absolute 368 15 - 500 cells/uL   Basophils Absolute 60  0 - 200 cells/uL   Neutrophils Relative % 57.1 %   Total Lymphocyte 26.6 %   Monocytes Relative 10.6 %   Eosinophils Relative 4.9 %   Basophils Relative 0.8 %  Lipid panel  Result Value Ref Range   Cholesterol 185 <200 mg/dL   HDL 38 (L) > OR = 40 mg/dL   Triglycerides 236 (H) <150 mg/dL   LDL Cholesterol (Calc) 111 (H) mg/dL (calc)   Total CHOL/HDL Ratio 4.9 <5.0 (calc)   Non-HDL Cholesterol (Calc) 147 (H) <130 mg/dL (calc)  COMPLETE  METABOLIC PANEL WITH GFR  Result Value Ref Range   Glucose, Bld 94 65 - 99 mg/dL   BUN 15 7 - 25 mg/dL   Creat 1.22 0.60 - 1.26 mg/dL   eGFR 77 > OR = 60 mL/min/1.50m   BUN/Creatinine Ratio NOT APPLICABLE 6 - 22 (calc)   Sodium 139 135 - 146 mmol/L   Potassium 4.2 3.5 - 5.3 mmol/L   Chloride 101 98 - 110 mmol/L   CO2 30 20 - 32 mmol/L   Calcium 9.9 8.6 - 10.3 mg/dL   Total Protein 7.6 6.1 - 8.1 g/dL   Albumin 4.7 3.6 - 5.1 g/dL   Globulin 2.9 1.9 - 3.7 g/dL (calc)   AG Ratio 1.6 1.0 - 2.5 (calc)   Total Bilirubin 0.6 0.2 - 1.2 mg/dL   Alkaline phosphatase (APISO) 59 36 - 130 U/L   AST 26 10 - 40 U/L   ALT 41 9 - 46 U/L      Assessment & Plan:   Problem List Items Addressed This Visit     Morbid obesity (HCC)   Elevated hemoglobin A1c   Other Visit Diagnoses     Annual physical exam    -  Primary       Updated Health Maintenance information Reviewed recent lab results with patient Encouraged improvement to lifestyle with diet and exercise Goal of weight loss  Stable mild elevated A1c 5.8-5.9 range Discussion on initiating GLP1 therapy for wt and A1 pre DM he will check into status notify [place] can do sample then order if covered  Hypogonadism followed by Urology On Testosterone Mild elevated HCT He can donate blood as planned  No orders of the defined types were placed in this encounter.     Follow up plan: Return in about 15 weeks (around 10/21/2021) for 15 weeks mid May 2023 Follow-up preDM A1c, wt  med adjust .  ANobie Putnam DO [facility] 07/08/2021, 2:51 PM

## 2021-07-08 NOTE — Patient Instructions (Addendum)
Thank you for coming to the office today.  Not quite at level of a problem yet, Hemoglobin / HCT elevated, goal to donate blood or do blood draw in future if needed.  Recent Labs    09/22/20 0801 07/01/21 0803  HGBA1C 5.8* 5.9*   Elevated Triglycerides.  Call insurance find cost and coverage of the following   1. Ozempic (Semaglutide injection) - 0.25mg  weekly for 4 weeks then increase to 0.5mg  weekly   2. Trulicity (Dulaglutide) - once weekly - 0.75mg  weekly.   Ozempic (injection once week Trulicity (injection once week  We can get these newer meds at low cost if you are interested.  3 benefits - 1 significantly reduced A1c sugar, and may be able to reduce or stop metformin in future - 2 reduced appetite and weight loss with good results - 3 cardiovascular risk reduction, less likely to have heart attack/stroke   Please schedule a Follow-up Appointment to: Return in about 15 weeks (around 10/21/2021) for 15 weeks mid May 2023 Follow-up preDM A1c, wt  med adjust .  If you have any other questions or concerns, please feel free to call the office or send a message through [place]. You may also schedule an earlier appointment if necessary.  Additionally, you may be receiving a survey about your experience at our office within a few days to 1 week by e-mail or mail. We value your feedback.  Nobie Putnam, DO [place]

## 2021-07-09 LAB — TESTOSTERONE: Testosterone: 711 ng/dL (ref 264–916)

## 2021-07-11 DIAGNOSIS — R7303 Prediabetes: Secondary | ICD-10-CM | POA: Insufficient documentation

## 2021-07-11 MED ORDER — OZEMPIC (0.25 OR 0.5 MG/DOSE) 2 MG/1.5ML ~~LOC~~ SOPN: 0.2500 mg | PEN_INJECTOR | SUBCUTANEOUS | 1 refills | Status: DC

## 2021-07-13 NOTE — Telephone Encounter (Signed)
PA has been submitted.

## 2021-07-21 ENCOUNTER — Encounter: Payer: Self-pay | Admitting: Psychiatry

## 2021-07-21 ENCOUNTER — Telehealth (INDEPENDENT_AMBULATORY_CARE_PROVIDER_SITE_OTHER): Payer: PRIVATE HEALTH INSURANCE | Admitting: Psychiatry

## 2021-07-21 ENCOUNTER — Other Ambulatory Visit: Payer: Self-pay

## 2021-07-21 DIAGNOSIS — F411 Generalized anxiety disorder: Secondary | ICD-10-CM

## 2021-07-21 DIAGNOSIS — G4701 Insomnia due to medical condition: Secondary | ICD-10-CM | POA: Diagnosis not present

## 2021-07-21 MED ORDER — TRULICITY 0.75 MG/0.5ML ~~LOC~~ SOAJ: 0.7500 mg | SUBCUTANEOUS | 2 refills | Status: DC

## 2021-07-21 NOTE — Progress Notes (Signed)
Virtual Visit via Video Note  I connected with Robert [name] on 07/21/21 at  3:30 PM EST by a video enabled telemedicine application and verified that I am speaking with the correct person using two identifiers.  Location Provider Location : [facility] Patient Location : Home  Participants: Patient , Provider    I discussed the limitations of evaluation and management by telemedicine and the availability of in person appointments. The patient expressed understanding and agreed to proceed.   I discussed the assessment and treatment plan with the patient. The patient was provided an opportunity to ask questions and all were answered. The patient agreed with the plan and demonstrated an understanding of the instructions.   The patient was advised to call back or seek an in-person evaluation if the symptoms worsen or if the condition fails to improve as anticipated.   [facility]inne MD OP Progress Note  07/21/2021 3:48 PM Jayin Derousse  MRN:  [id number]  Chief Complaint:  Chief Complaint   Follow-up 41 year old Caucasian male with history of GAD, insomnia, Tourette's disorder presented for medication management.    HPI: Robert [name] is a 41 year old Caucasian male, lives in [place], employed, has a history of GAD, insomnia, history of Tourette's disorder, gastroesophageal reflux disease was evaluated by telemedicine today.  Patient today reports the past few days he has been sick with GI symptoms.  Likely contracted it from friends .  Currently feels much better.  Currently back at work and reports work is busy.  Tried the doxepin 10 mg for a few nights although it helped did not help him to stay asleep.  Hence tried going up on the dosage to a 20 mg and that helped for a couple of nights.  However had to stop taking it due to GI symptoms few nights ago and is planning to go back on it once he feels better.  Patient denies any suicidality, homicidality or perceptual disturbances.  Reports some anxiety  about the current situational stressors and the current job market.  However reports mood symptoms as manageable on the current dosage of venlafaxine.  Denies side effects.  Planning to lose weight as well as bring his hemoglobin A1c down however could not get ozempic or wegovy approved by his health insurance plan.  Denies any other concerns today.  Visit Diagnosis:    ICD-10-CM   1. GAD (generalized anxiety disorder)  F41.1     2. Insomnia due to medical condition  G47.01    mood      Past Psychiatric History: Reviewed past psychiatric history from progress note on 09/29/2020.  Past trials of Lexapro, Wellbutrin, BuSpar, venlafaxine.  Past Medical History:  Past Medical History:  Diagnosis Date   ADHD    Anxiety    GERD (gastroesophageal reflux disease)    Wears contact lenses     Past Surgical History:  Procedure Laterality Date   ESOPHAGOGASTRODUODENOSCOPY (EGD) WITH PROPOFOL N/A 06/14/2017   Procedure: ESOPHAGOGASTRODUODENOSCOPY (EGD) WITH PROPOFOL;  Surgeon: Lucilla Lame, MD;  Location: [facility];  Service: Endoscopy;  Laterality: N/A;   SEPTOPLASTY  06/13/2011   WISDOM TOOTH EXTRACTION      Family Psychiatric History: Reviewed family psychiatric history from progress note on 09/29/2020.  Family History:  Family History  Problem Relation Age of Onset   Depression Mother    Suicidality Mother    Cancer Maternal Grandfather 52       stomach   Esophageal cancer Maternal Grandfather 69   Depression Sister  Colon cancer Neg Hx    Prostate cancer Neg Hx    Diabetes Neg Hx    Hypertension Neg Hx    Hypercholesterolemia Neg Hx     Social History: Reviewed social history from progress note on 09/29/2020. Social History   Socioeconomic History   Marital status: Divorced    Spouse name: Not on file   Number of children: 1   Years of education: Vocational   Highest education level: Not on file  Occupational History   Occupation: IT    Comment: Commuting  to [employer]  Tobacco Use   Smoking status: Former    Packs/day: 1.00    Years: 15.00    Pack years: 15.00    Types: Cigarettes    Quit date: 10/10/2016    Years since quitting: 4.7   Smokeless tobacco: Former   Tobacco comments:    Quit using vaping  Vaping Use   Vaping Use: Former   Quit date: 04/14/2018  Substance and Sexual Activity   Alcohol use: No   Drug use: No   Sexual activity: Yes    Birth control/protection: Surgical    Comment: Vasectomy 2013  Other Topics Concern   Not on file  Social History Narrative   Not on file   Social Determinants of Health   Financial Resource Strain: Not on file  Food Insecurity: Not on file  Transportation Needs: Not on file  Physical Activity: Not on file  Stress: Not on file  Social Connections: Not on file    Allergies: No Known Allergies  Metabolic Disorder Labs: Lab Results  Component Value Date   HGBA1C 5.9 (H) 07/01/2021   MPG 123 07/01/2021   MPG 120 09/22/2020   Lab Results  Component Value Date   PROLACTIN 8.9 05/16/2018   Lab Results  Component Value Date   CHOL 185 07/01/2021   TRIG 236 (H) 07/01/2021   HDL 38 (L) 07/01/2021   CHOLHDL 4.9 07/01/2021   LDLCALC 111 (H) 07/01/2021   LDLCALC 105 (H) 09/22/2020   Lab Results  Component Value Date   TSH 1.91 07/01/2021   TSH 1.85 09/22/2020    Therapeutic Level Labs: No results found for: LITHIUM No results found for: VALPROATE No components found for:  CBMZ  Current Medications: Current Outpatient Medications  Medication Sig Dispense Refill   chlorhexidine (PERIDEX) 0.12 % solution SMARTSIG:By Mouth     dicyclomine (BENTYL) 10 MG capsule Take 1 capsule (10 mg total) by mouth at bedtime as needed for spasms (cramping). 90 capsule 3   doxepin (SINEQUAN) 10 MG capsule Take 1-2 capsules (10-20 mg total) by mouth at bedtime as needed. SLEEP 60 capsule 1   finasteride (PROPECIA) 1 MG tablet Take 1 tablet by mouth daily. (Patient not taking: Reported on  07/08/2021)     NEEDLE, DISP, 18 G (BD DISP NEEDLES) 18G X 1-1/2" MISC Use 18G to draw up Testosterone medication 50 each 0   NEEDLE, DISP, 21 G (BD ECLIPSE NEEDLE) 21G X 1-1/2" MISC Use 21G needle to inject Testosterone medication. 50 each 0   PILOT COVID-19 AT-HOME TEST KIT      propranolol (INDERAL) 10 MG tablet Take 1 tablet (10 mg total) by mouth 3 (three) times daily as needed. For severe anxiety attacks 270 tablet 1   Syringe, Disposable, (2-3CC SYRINGE) 3 ML MISC Use as directed 100 each 0   testosterone cypionate (DEPOTESTOSTERONE CYPIONATE) 200 MG/ML injection Inject 1.5 mLs (300 mg total) into the muscle every 14 (fourteen)  days. 6 mL 0   TRULICITY 4.56 YB/6.3SL SOPN Inject 0.75 mg into the skin once a week. 2 mL 2   venlafaxine XR (EFFEXOR-XR) 37.5 MG 24 hr capsule Take 1 capsule (37.5 mg total) by mouth daily with breakfast. Take along with 75 mg daily 90 capsule 0   venlafaxine XR (EFFEXOR-XR) 75 MG 24 hr capsule Take 1 capsule (75 mg total) by mouth daily. 90 capsule 0   No current facility-administered medications for this visit.     Musculoskeletal: Strength & Muscle Tone:  UTA Gait & Station:  Seated Patient leans: N/A  Psychiatric Specialty Exam: Review of Systems  Gastrointestinal:  Positive for abdominal pain and diarrhea.  Psychiatric/Behavioral:  Positive for sleep disturbance. The patient is nervous/anxious.   All other systems reviewed and are negative.  There were no vitals taken for this visit.There is no height or weight on file to calculate BMI.  General Appearance: Casual  Eye Contact:  Fair  Speech:  Clear and Coherent  Volume:  Normal  Mood:  Anxious  Affect:  Congruent  Thought Process:  Goal Directed and Descriptions of Associations: Intact  Orientation:  Full (Time, Place, and Person)  Thought Content: Logical   Suicidal Thoughts:  No  Homicidal Thoughts:  No  Memory:  Immediate;   Fair Recent;   Fair Remote;   Fair  Judgement:  Fair   Insight:  Fair  Psychomotor Activity:  Normal  Concentration:  Concentration: Fair and Attention Span: Fair  Recall:  [employer] of Knowledge: Fair  Language: Fair  Akathisia:  No  Handed:  Right  AIMS (if indicated): not done  Assets:  Communication Skills Desire for Improvement Housing Social Support  ADL's:  Intact  Cognition: WNL  Sleep:   improving   Screenings: GAD-7    Flowsheet Row Office Visit from 07/08/2021 in [facility] Video Visit from 06/30/2021 in [facility] Office Visit from 04/01/2021 in [facility] Video Visit from 02/03/2021 in [facility] Office Visit from 01/04/2021 in [facility]  Total GAD-7 Score _0 PHQ2-9    [place] Office Visit from 07/08/2021 in [facility] Video Visit from 06/30/2021 in [facility] Video Visit from 04/07/2021 in [facility] Office Visit from 04/01/2021 in [facility] Video Visit from 02/03/2021 in [facility]  PHQ-2 Total Score _1 0  PHQ-9 Total Score 7 -- _2 Flowsheet Row Video Visit from 06/30/2021 in [facility] Office Visit from 01/04/2021 in [facility] Video Visit from 09/29/2020 in [facility] No Risk No Risk No Risk        Assessment and Plan: Kerolos Nehme is a 41 year old Caucasian male who has a history of GAD was evaluated by telemedicine today.  Patient is currently struggling with GI symptoms, currently back at work and reports improvement with his sleep on the current medication regimen.  Plan as noted below.  Plan GAD-improving Venlafaxine extended release 112.5 mg p.o. daily Propranolol 10 mg p.o. 3 times daily as needed Discussed referral to  CBT, patient reports he will try to get established with therapist likely in [place].  Insomnia-improving Doxepin 10 to 20 mg p.o. nightly as needed Continue CPAP  Patient to continue to follow up with  primary care provider for her GI symptoms.  Patient to continue to follow up with primary care provider for weight loss, prediabetes.  Follow-up in clinic in 6 to 8 weeks or sooner if needed.  This note was generated in part or whole with voice recognition software. Voice recognition is usually quite accurate but there are transcription errors that can and very often do occur. I apologize for any typographical errors that were not detected and corrected.            Ursula Alert, MD 07/22/2021, 2:04 PM

## 2021-07-21 NOTE — Addendum Note (Signed)
Addended by: Smitty Cords on: 07/21/2021 04:44 PM   Modules accepted: Orders

## 2021-08-29 ENCOUNTER — Other Ambulatory Visit: Payer: Self-pay | Admitting: Urology

## 2021-08-30 ENCOUNTER — Encounter: Payer: Self-pay | Admitting: Urology

## 2021-08-31 ENCOUNTER — Other Ambulatory Visit: Payer: Self-pay

## 2021-08-31 ENCOUNTER — Other Ambulatory Visit: Payer: Self-pay | Admitting: Family Medicine

## 2021-08-31 ENCOUNTER — Encounter: Payer: Self-pay | Admitting: Psychiatry

## 2021-08-31 ENCOUNTER — Telehealth (INDEPENDENT_AMBULATORY_CARE_PROVIDER_SITE_OTHER): Payer: PRIVATE HEALTH INSURANCE | Admitting: Psychiatry

## 2021-08-31 DIAGNOSIS — F5101 Primary insomnia: Secondary | ICD-10-CM | POA: Diagnosis not present

## 2021-08-31 DIAGNOSIS — F411 Generalized anxiety disorder: Secondary | ICD-10-CM

## 2021-08-31 MED ORDER — VENLAFAXINE HCL ER 75 MG PO CP24: 75.0000 mg | ORAL_CAPSULE | Freq: Every day | ORAL | 0 refills | Status: DC

## 2021-08-31 MED ORDER — DOXEPIN HCL 10 MG PO CAPS: 10.0000 mg | ORAL_CAPSULE | Freq: Every evening | ORAL | 1 refills | Status: DC | PRN

## 2021-08-31 MED ORDER — VENLAFAXINE HCL ER 37.5 MG PO CP24: 37.5000 mg | ORAL_CAPSULE | Freq: Every day | ORAL | 0 refills | Status: DC

## 2021-08-31 NOTE — Progress Notes (Signed)
Virtual Visit via Video Note ? ?I connected with Robert [name] on 08/31/21 at  1:40 PM EDT by a video enabled telemedicine application and verified that I am speaking with the correct person using two identifiers. ? ?Location ?Provider Location : [facility] ?Patient Location : Home ? ?Participants: Patient , Provider ?  ?I discussed the limitations of evaluation and management by telemedicine and the availability of in person appointments. The patient expressed understanding and agreed to proceed. ?  ?I discussed the assessment and treatment [name] with the patient. The patient was provided an opportunity to ask questions and all were answered. The patient agreed with the [name] and demonstrated an understanding of the instructions. ?  ?The patient was advised to call back or seek an in-person evaluation if the symptoms worsen or if the condition fails to improve as anticipated. ? ? ?[facility] MD OP Progress Note ? ?08/31/2021 2:42 PM ?Robert [name]  ?MRN:  [id number] ? ?Chief Complaint:  ?Chief Complaint  ?Patient presents with  ? Follow-up: 41 year old Caucasian male, has a history of GAD, insomnia, Tourette's disorder, presented for medication management.  ? ?HPI: Robert [name] is a 41 year old Caucasian male, lives in [place], employed, has a history of GAD, insomnia, history of Tourette's disorder, gastroesophageal reflux disease was evaluated by telemedicine today. ? ?Patient today reports he is currently doing fairly well with regards to anxiety.  Currently compliant on venlafaxine.  Denies side effects. ? ?Reports sleep as restless for a few nights however it looks like it is improving.  Currently on doxepin takes only 10 mg.  Tried the 20 mg and felt weird some nights.  Hence has been trying to just stay on the 10 mg for now.  Agreeable that he needs to work on sleep hygiene.  Bedtime is around 10:30 PM.  Reports he stays on his computer doing work or playing video games at least until 10 PM.  Needs to be up in the morning  by 7 AM to be at work. ? ?Patient denies any suicidality, homicidality or perceptual disturbances. ? ?Patient denies any other concerns today. ? ?Visit Diagnosis:  ?  ICD-10-CM   ?1. GAD (generalized anxiety disorder)  F41.1 venlafaxine XR (EFFEXOR-XR) 37.5 MG 24 hr capsule  ?  venlafaxine XR (EFFEXOR-XR) 75 MG 24 hr capsule  ?  ?2. Primary insomnia  F51.01 doxepin (SINEQUAN) 10 MG capsule  ?  ? ? ?Past Psychiatric History: Reviewed past psychiatric history from progress note on 09/29/2020.  Past trials of Lexapro, Wellbutrin, BuSpar, venlafaxine. ? ?Past Medical History:  ?Past Medical History:  ?Diagnosis Date  ? ADHD   ? Anxiety   ? GERD (gastroesophageal reflux disease)   ? Wears contact lenses   ?  ?Past Surgical History:  ?Procedure Laterality Date  ? ESOPHAGOGASTRODUODENOSCOPY (EGD) WITH PROPOFOL N/A 06/14/2017  ? Procedure: ESOPHAGOGASTRODUODENOSCOPY (EGD) WITH PROPOFOL;  Surgeon: Lucilla Lame, MD;  Location: [facility];  Service: Endoscopy;  Laterality: N/A;  ? SEPTOPLASTY  06/13/2011  ? WISDOM TOOTH EXTRACTION    ? ? ?Family Psychiatric History: Reviewed family psychiatric history from progress note on 09/29/2020. ? ?Family History:  ?Family History  ?Problem Relation Age of Onset  ? Depression Mother   ? Suicidality Mother   ? Cancer Maternal Grandfather 76  ?     stomach  ? Esophageal cancer Maternal Grandfather 68  ? Depression Sister   ? Colon cancer Neg Hx   ? Prostate cancer Neg Hx   ? Diabetes Neg Hx   ?  Hypertension Neg Hx   ? Hypercholesterolemia Neg Hx   ? ? ?Social History: Reviewed social history from progress note on 09/29/2020. ?Social History  ? ?Socioeconomic History  ? Marital status: Divorced  ?  Spouse name: Not on file  ? Number of children: 1  ? Years of education: Vocational  ? Highest education level: Not on file  ?Occupational History  ? Occupation: IT  ?  Comment: Commuting to [employer]  ?Tobacco Use  ? Smoking status: Former  ?  Packs/day: 1.00  ?  Years: 15.00  ?  Pack  years: 15.00  ?  Types: Cigarettes  ?  Quit date: 10/10/2016  ?  Years since quitting: 4.8  ? Smokeless tobacco: Former  ? Tobacco comments:  ?  Quit using vaping  ?Vaping Use  ? Vaping Use: Former  ? Quit date: 04/14/2018  ?Substance and Sexual Activity  ? Alcohol use: No  ? Drug use: No  ? Sexual activity: Yes  ?  Birth control/protection: Surgical  ?  Comment: Vasectomy 2013  ?Other Topics Concern  ? Not on file  ?Social History Narrative  ? Not on file  ? ?Social Determinants of Health  ? ?Financial Resource Strain: Not on file  ?Food Insecurity: Not on file  ?Transportation Needs: Not on file  ?Physical Activity: Not on file  ?Stress: Not on file  ?Social Connections: Not on file  ? ? ?Allergies: No Known Allergies ? ?Metabolic Disorder Labs: ?Lab Results  ?Component Value Date  ? HGBA1C 5.9 (H) 07/01/2021  ? MPG 123 07/01/2021  ? MPG 120 09/22/2020  ? ?Lab Results  ?Component Value Date  ? PROLACTIN 8.9 05/16/2018  ? ?Lab Results  ?Component Value Date  ? CHOL 185 07/01/2021  ? TRIG 236 (H) 07/01/2021  ? HDL 38 (L) 07/01/2021  ? CHOLHDL 4.9 07/01/2021  ? LDLCALC 111 (H) 07/01/2021  ? LDLCALC 105 (H) 09/22/2020  ? ?Lab Results  ?Component Value Date  ? TSH 1.91 07/01/2021  ? TSH 1.85 09/22/2020  ? ? ?Therapeutic Level Labs: ?No results found for: LITHIUM ?No results found for: VALPROATE ?No components found for:  CBMZ ? ?Current Medications: ?Current Outpatient Medications  ?Medication Sig Dispense Refill  ? chlorhexidine (PERIDEX) 0.12 % solution SMARTSIG:By Mouth    ? dicyclomine (BENTYL) 10 MG capsule Take 1 capsule (10 mg total) by mouth at bedtime as needed for spasms (cramping). 90 capsule 3  ? doxepin (SINEQUAN) 10 MG capsule Take 1-2 capsules (10-20 mg total) by mouth at bedtime as needed. SLEEP 60 capsule 1  ? finasteride (PROPECIA) 1 MG tablet Take 1 tablet by mouth daily. (Patient not taking: Reported on 07/08/2021)    ? NEEDLE, DISP, 18 G (BD DISP NEEDLES) 18G X 1-1/2" MISC Use 18G to draw up  Testosterone medication 50 each 0  ? NEEDLE, DISP, 21 G (BD ECLIPSE NEEDLE) 21G X 1-1/2" MISC Use 21G needle to inject Testosterone medication. 50 each 0  ? PILOT COVID-19 AT-HOME TEST KIT     ? propranolol (INDERAL) 10 MG tablet Take 1 tablet (10 mg total) by mouth 3 (three) times daily as needed. For severe anxiety attacks 270 tablet 1  ? Syringe, Disposable, (2-3CC SYRINGE) 3 ML MISC Use as directed 100 each 0  ? testosterone cypionate (DEPOTESTOSTERONE CYPIONATE) 200 MG/ML injection Inject 1.5 mLs (300 mg total) into the muscle every 14 (fourteen) days. 6 mL 0  ? TRULICITY 6.56 CL/2.7NT SOPN Inject 0.75 mg into the skin once a week. 2  mL 2  ? venlafaxine XR (EFFEXOR-XR) 37.5 MG 24 hr capsule Take 1 capsule (37.5 mg total) by mouth daily with breakfast. Take along with 75 mg daily 90 capsule 0  ? venlafaxine XR (EFFEXOR-XR) 75 MG 24 hr capsule Take 1 capsule (75 mg total) by mouth daily. 90 capsule 0  ? ?No current facility-administered medications for this visit.  ? ? ? ?Musculoskeletal: ?Strength & Muscle Tone:  UTA ?Gait & Station:  Seated ?Patient leans: N/A ? ?Psychiatric Specialty Exam: ?Review of Systems  ?Psychiatric/Behavioral:  Positive for sleep disturbance. Negative for agitation, behavioral problems, confusion, decreased concentration, dysphoric mood, hallucinations, self-injury and suicidal ideas. The patient is not nervous/anxious and is not hyperactive.   ?All other systems reviewed and are negative.  ?There were no vitals taken for this visit.There is no height or weight on file to calculate BMI.  ?General Appearance: Casual  ?Eye Contact:  Fair  ?Speech:  Clear and Coherent  ?Volume:  Normal  ?Mood:  Euthymic  ?Affect:  Congruent  ?Thought Process:  Goal Directed and Descriptions of Associations: Intact  ?Orientation:  Full (Time, Place, and Person)  ?Thought Content: Logical   ?Suicidal Thoughts:  No  ?Homicidal Thoughts:  No  ?Memory:  Immediate;   Fair ?Recent;   Fair ?Remote;   Fair   ?Judgement:  Fair  ?Insight:  Fair  ?Psychomotor Activity:  Normal  ?Concentration:  Concentration: Fair and Attention Span: Fair  ?Recall:  Fair  ?Fund of Knowledge: Fair  ?Language: Fair  ?Akathisia:  No  ?Handed:  [place]

## 2021-08-31 NOTE — Telephone Encounter (Signed)
Patient called and is out of his testosterone and he needs to give himself an injection on Friday this week. He is scheduled for labs next Friday then an OV the next week.  ?

## 2021-09-01 NOTE — Telephone Encounter (Signed)
Was he getting his testosterone from the [facility], because Epic says it is inactive.  ?

## 2021-09-02 ENCOUNTER — Other Ambulatory Visit: Payer: Self-pay

## 2021-09-02 ENCOUNTER — Encounter: Payer: Self-pay | Admitting: Urology

## 2021-09-05 ENCOUNTER — Ambulatory Visit: Payer: Self-pay | Admitting: Urology

## 2021-09-05 NOTE — Telephone Encounter (Signed)
Spoke with patient and advised results ? ?Pt will get labs first and not get blood drawn and see what the results are. ?

## 2021-09-05 NOTE — Telephone Encounter (Signed)
See phone note, spoke with patient this morning ?

## 2021-09-07 ENCOUNTER — Other Ambulatory Visit: Payer: Self-pay | Admitting: *Deleted

## 2021-09-07 ENCOUNTER — Ambulatory Visit: Payer: BC Managed Care – PPO | Admitting: Urology

## 2021-09-07 DIAGNOSIS — E291 Testicular hypofunction: Secondary | ICD-10-CM

## 2021-09-09 ENCOUNTER — Other Ambulatory Visit: Payer: BC Managed Care – PPO

## 2021-09-09 DIAGNOSIS — E291 Testicular hypofunction: Secondary | ICD-10-CM

## 2021-09-10 LAB — HEMOGLOBIN AND HEMATOCRIT, BLOOD
Hematocrit: 53.3 % — ABNORMAL HIGH (ref 37.5–51.0)
Hemoglobin: 18.2 g/dL — ABNORMAL HIGH (ref 13.0–17.7)

## 2021-09-10 LAB — PSA: Prostate Specific Ag, Serum: 0.9 ng/mL (ref 0.0–4.0)

## 2021-09-10 LAB — TESTOSTERONE: Testosterone: 466 ng/dL (ref 264–916)

## 2021-09-12 ENCOUNTER — Encounter: Payer: Self-pay | Admitting: Urology

## 2021-09-12 ENCOUNTER — Ambulatory Visit: Payer: BC Managed Care – PPO | Admitting: Urology

## 2021-09-12 VITALS — BP 158/94 | HR 94 | Ht 66.0 in | Wt 223.0 lb

## 2021-09-12 DIAGNOSIS — E291 Testicular hypofunction: Secondary | ICD-10-CM

## 2021-09-12 MED ORDER — TESTOSTERONE CYPIONATE 200 MG/ML IM SOLN: 200.0000 mg | INTRAMUSCULAR | 0 refills | Status: DC

## 2021-09-12 NOTE — Progress Notes (Signed)
? ?09/12/2021 ?4:46 PM  ? ?Marvel Plan ?[birth year] ?[id number] ? ?Referring provider: Olin Hauser, DO ?[address] ?[place],  [place] [address] ? ?Chief Complaint  ?Patient presents with  ? Hypogonadism  ? ? ? ?Urologic history: ?1. Hypogonadism ?-Trial Clomid 12/19; improved T but not symptoms ?-TRT started 07/2018 ?-Symptoms tiredness, fatigue, decreased libido ?-Testosterone cypionate 200 mg every 2 weeks ? ?HPI: ?41 y.o. male presents for annual follow-up. ? ?Stable symptoms on TRT ?No bothersome LUTS ?His testosterone dose was increased to 300 mg every 2 weeks however he has not really seen any improvement in his symptoms from 200 versus 300 ?Hematocrit January 2023 was 53.4 and 3/31 was 53.3.  Blood donation has been recommended but he has not done yet ?Testosterone was not refilled until he came in for an office visit to discuss further.  Testosterone level 3 weeks after his last injection was 466.  PSA stable at 0.9 ? ? ?PMH: ?Past Medical History:  ?Diagnosis Date  ? ADHD   ? Anxiety   ? GERD (gastroesophageal reflux disease)   ? Wears contact lenses   ? ? ?Surgical History: ?Past Surgical History:  ?Procedure Laterality Date  ? ESOPHAGOGASTRODUODENOSCOPY (EGD) WITH PROPOFOL N/A 06/14/2017  ? Procedure: ESOPHAGOGASTRODUODENOSCOPY (EGD) WITH PROPOFOL;  Surgeon: Lucilla Lame, MD;  Location: [facility];  Service: Endoscopy;  Laterality: N/A;  ? SEPTOPLASTY  06/13/2011  ? WISDOM TOOTH EXTRACTION    ? ? ?Home Medications:  ?Allergies as of 09/12/2021   ?No Known Allergies ?  ? ?  ?Medication List  ?  ? ?  ? Accurate as of September 12, 2021  4:46 PM. If you have any questions, ask your nurse or doctor.  ?  ?  ? ?  ? ?2-3CC SYRINGE 3 ML Misc ?Use as directed ?  ?chlorhexidine 0.12 % solution ?Commonly known as: PERIDEX ?SMARTSIG:By Mouth ?  ?dicyclomine 10 MG capsule ?Commonly known as: Bentyl ?Take 1 capsule (10 mg total) by mouth at bedtime as needed for spasms (cramping). ?  ?doxepin 10 MG  capsule ?Commonly known as: SINEQUAN ?Take 1-2 capsules (10-20 mg total) by mouth at bedtime as needed. SLEEP ?  ?finasteride 1 MG tablet ?Commonly known as: PROPECIA ?Take 1 tablet by mouth daily. ?  ?NEEDLE (DISP) 18 G 18G X 1-1/2" Misc ?Commonly known as: BD Disp Needles ?Use 18G to draw up Testosterone medication ?  ?NEEDLE (DISP) 21 G 21G X 1-1/2" Misc ?Commonly known as: BD Eclipse Needle ?Use 21G needle to inject Testosterone medication. ?  ?Pilot COVID-19 At-Home Test Kit ?Generic drug: COVID-19 At Home Antigen Test ?  ?propranolol 10 MG tablet ?Commonly known as: INDERAL ?Take 1 tablet (10 mg total) by mouth 3 (three) times daily as needed. For severe anxiety attacks ?  ?testosterone cypionate 200 MG/ML injection ?Commonly known as: DEPOTESTOSTERONE CYPIONATE ?Inject 1.5 mLs (300 mg total) into the muscle every 14 (fourteen) days. ?  ?Trulicity 1.31 YH/8.8IL Sopn ?Generic drug: Dulaglutide ?Inject 0.75 mg into the skin once a week. ?  ?venlafaxine XR 37.5 MG 24 hr capsule ?Commonly known as: EFFEXOR-XR ?Take 1 capsule (37.5 mg total) by mouth daily with breakfast. Take along with 75 mg daily ?  ?venlafaxine XR 75 MG 24 hr capsule ?Commonly known as: EFFEXOR-XR ?Take 1 capsule (75 mg total) by mouth daily. ?  ? ?  ? ? ?Allergies: No Known Allergies ? ?Family History: ?Family History  ?Problem Relation Age of Onset  ? Depression Mother   ?  Suicidality Mother   ? Cancer Maternal Grandfather 32  ?     stomach  ? Esophageal cancer Maternal Grandfather 56  ? Depression Sister   ? Colon cancer Neg Hx   ? Prostate cancer Neg Hx   ? Diabetes Neg Hx   ? Hypertension Neg Hx   ? Hypercholesterolemia Neg Hx   ? ? ?Social History:  reports that he quit smoking about 4 years ago. His smoking use included cigarettes. He has a 15.00 pack-year smoking history. He has quit using smokeless tobacco. He reports that he does not drink alcohol and does not use drugs. ? ? ?Physical Exam: ?BP (!) 158/94   Pulse 94   Ht 5' 6"  (1.676 m)   Wt 223 lb (101.2 kg)   BMI 35.99 kg/m?   ?Constitutional:  Alert and oriented, No acute distress. ?HEENT: [place] AT, moist mucus membranes.  Trachea midline, no masses. ?Cardiovascular: No clubbing, cyanosis, or edema. ?Respiratory: Normal respiratory effort, no increased work of breathing. ? ? ?Assessment & Plan:   ? ?1.  Hypogonadism ?Stable on TRT ?Decrease his dose back to 200 mg every 2 weeks ?Lab visit 6 months testosterone, hematocrit ?Office visit 1 year with labs ? ?2.  Erythrocytosis ?Stressed the need for regular blood donation and if unable to do will refer to hematology ?He indicated he would get this done in the next few weeks ?Will refill his testosterone x1 month ?Lower testosterone dose may also impact his erythrocytosis ? ? ?Abbie Sons, MD ? ?[facility] ?[address], [address] ?[place], [place] [address] ?[phone] ? ?

## 2021-09-18 ENCOUNTER — Encounter: Payer: Self-pay | Admitting: Urology

## 2021-10-21 ENCOUNTER — Ambulatory Visit: Payer: BC Managed Care – PPO | Admitting: Family Medicine

## 2021-11-08 ENCOUNTER — Encounter: Payer: Self-pay | Admitting: Family Medicine

## 2021-11-08 ENCOUNTER — Ambulatory Visit: Payer: BC Managed Care – PPO | Admitting: Family Medicine

## 2021-11-08 VITALS — BP 132/78 | HR 115 | Ht 66.0 in | Wt 213.6 lb

## 2021-11-08 DIAGNOSIS — F419 Anxiety disorder, unspecified: Secondary | ICD-10-CM | POA: Diagnosis not present

## 2021-11-08 DIAGNOSIS — R7303 Prediabetes: Secondary | ICD-10-CM | POA: Diagnosis not present

## 2021-11-08 DIAGNOSIS — G4733 Obstructive sleep apnea (adult) (pediatric): Secondary | ICD-10-CM

## 2021-11-08 DIAGNOSIS — R000 Tachycardia, unspecified: Secondary | ICD-10-CM | POA: Diagnosis not present

## 2021-11-08 LAB — POCT GLYCOSYLATED HEMOGLOBIN (HGB A1C): Hemoglobin A1C: 5.8 % — AB (ref 4.0–5.6)

## 2021-11-08 MED ORDER — CONTRAVE 8-90 MG PO TB12: ORAL_TABLET | ORAL | 0 refills | Status: DC

## 2021-11-08 MED ORDER — METOPROLOL SUCCINATE ER 25 MG PO TB24: 25.0000 mg | ORAL_TABLET | Freq: Every day | ORAL | 3 refills | Status: DC

## 2021-11-08 NOTE — Progress Notes (Addendum)
Subjective:    Patient ID: Robert [name], male    DOB: [birth year], 41 y.o.   MRN: [id number]  Robert [name] is a 41 y.o. male presenting on 11/08/2021 for Prediabetes   HPI  Father passed away recently was up [place] for father's funeral.  Obesity BMI >34 Pre-Diabetes Hyperlipidemia   A1c 5.9 previously, now down to 5.8 Weight loss 25 lbs in past 4 months  Lifestyle: Admits caffeine can impact him at times. Stretching has caused him to have some lightheadedness.   He is on testosterone therapy. Tried to donate blood unable to do so due to elevated HR   Generalized Anxiety Disorder Insomnia Followed by Dr Ferdie Ping Psychiatry. Previously w/ Dr Nicolasa Ducking On Venlafaxine 112.5mg  daily (37.5 + 75) Has tapered off Escitalopram. Now on new rx Venlafaxine 75mg  XR daily Taking Propranol IR PRN 1-3 times most days with mixed review good results but it fades quickly.   OSA, on CPAP - Patient reports prior history of dx OSA and on CPAP for years, prior to treatment initial symptoms were snoring, daytime sleepiness and fatigue, has had several sleep studies in the past. - Today reports that sleep apnea is well controlled. He uses the CPAP machine every night. Tolerates the machine well, and thinks that sleeps better with it and feels good. No new concerns or symptoms.       11/08/2021    9:59 AM 08/31/2021    1:55 PM 07/08/2021    2:48 PM  Depression screen PHQ 2/9  Decreased Interest 2  2  Down, Depressed, Hopeless 2  1  PHQ - 2 Score 4  3  Altered sleeping 2  2  Tired, decreased energy 2  1  Change in appetite 1  0  Feeling bad or failure about yourself    0  Trouble concentrating 2  1  Moving slowly or fidgety/restless 0  0  Suicidal thoughts 0  0  PHQ-9 Score 11  7  Difficult doing work/chores Not difficult at all  Not difficult at all     Information is confidential and restricted. Go to Review Flowsheets to unlock data.    Social History   Tobacco Use   Smoking  status: Former    Packs/day: 1.00    Years: 15.00    Pack years: 15.00    Types: Cigarettes    Quit date: 10/10/2016    Years since quitting: 5.0   Smokeless tobacco: Former   Tobacco comments:    Quit using vaping  Vaping Use   Vaping Use: Former   Quit date: 04/14/2018  Substance Use Topics   Alcohol use: No   Drug use: No    Review of Systems Per HPI unless specifically indicated above     Objective:    BP 132/78   Pulse (!) 115   Ht 5\' 6"  (1.676 m)   Wt 213 lb 9.6 oz (96.9 kg)   SpO2 99%   BMI 34.48 kg/m   Wt Readings from Last 3 Encounters:  11/08/21 213 lb 9.6 oz (96.9 kg)  09/12/21 223 lb (101.2 kg)  07/08/21 237 lb (107.5 kg)     Physical Exam Vitals and nursing note reviewed.  Constitutional:      General: He is not in acute distress.    Appearance: Normal appearance. He is well-developed. He is obese. He is not diaphoretic.     Comments: Well-appearing, comfortable, cooperative  HENT:     Head: Normocephalic and atraumatic.  Eyes:  General:        Right eye: No discharge.        Left eye: No discharge.     Conjunctiva/sclera: Conjunctivae normal.  Cardiovascular:     Rate and Rhythm: Tachycardia present.  Pulmonary:     Effort: Pulmonary effort is normal.  Skin:    General: Skin is warm and dry.     Findings: No erythema or rash.  Neurological:     Mental Status: He is alert and oriented to person, place, and time.  Psychiatric:        Mood and Affect: Mood normal.        Behavior: Behavior normal.        Thought Content: Thought content normal.     Comments: Well groomed, good eye contact, normal speech and thoughts     Results for orders placed or performed in visit on 11/08/21  POCT HgB A1C  Result Value Ref Range   Hemoglobin A1C 5.8 (A) 4.0 - 5.6 %      Assessment & Plan:   Problem List Items Addressed This Visit     Anxiety   Relevant Medications   metoprolol succinate (TOPROL-XL) 25 MG 24 hr tablet   Morbid obesity (HCC)    OSA (obstructive sleep apnea)   Pre-diabetes - Primary   Relevant Orders   POCT HgB A1C (Completed)   Other Visit Diagnoses     Tachycardia       Relevant Medications   metoprolol succinate (TOPROL-XL) 25 MG 24 hr tablet       Anxiety Continue w/ psychiatry SNRI doing well on Venlafaxine Concern w/ possible side effect and has had high HR w anxiety at times Will switch Propranolol PRN over to daily therapy Metoprolol XL 24 hr 25mg  daily  Obesity Weight management options limited in past due to ins not covering GLP1 WIll trial [name], [facility] mail order. Note interaction w Metoprolol can increase potency of BB, may reduce dose by half or pause while on this med.  PreDM A1c improved to 5.8 now, previous 5.8 to 5.9 range < 6 is goal Keep improving weight lifestyle  Well controlled, chronic OSA on CPAP, 9 cm H2O - Good adherence to CPAP nightly - Continue current CPAP therapy, patient seems to be benefiting from therapy  Meds ordered this encounter  Medications   metoprolol succinate (TOPROL-XL) 25 MG 24 hr tablet    Sig: Take 1 tablet (25 mg total) by mouth daily.    Dispense:  90 tablet    Refill:  3   CONTRAVE 8-90 MG TB12    Sig: Week 1: Take 1 tab daily with breakfast. Week 2: Take 1 tab twice daily with meal. Week 3: Take 2 tab with AM meal and 1 tab with PM meal. Week 4+: Take 2 tabs twice daily with meals - continue for weight loss    Dispense:  120 tablet    Refill:  0      Follow up plan: Return in about 3 months (around 02/08/2022) for 3 month follow-up HR / Weight management updates.   Nobie Putnam, [facility] 11/08/2021, 9:58 AM

## 2021-11-08 NOTE — Patient Instructions (Addendum)
Thank you for coming to the office today.  Recent Labs    07/01/21 0803 11/08/21 0958  HGBA1C 5.9* 5.8*    Ideally, Metoprolol XL 25mg  daily will help overall, instead of short term Propranolol.  Call insurance find cost and coverage of the following - check the following: - Drug Tier, Preferred List, On Formulary - All will require a "Prior Authorization" from first, before you can find out the cost - Find out if there is "Step Therapy" (other medicines required before you can try these)  Once you pick the one you want to try, let me know - we can get a sample ready IF we have it in stock. Then try it - and before running out of medicine, contact me back to order your Rx so we have time to get it processed.  [facility], mail order for Contrave should be after the company picks up their price / $99 per month approx  Caution if taking BOTH Metoprolol and Contrave, the metoprolol will have higher effect, therefore may lower BP and pulse too much. Caution with this, can reduce Metoprolol dose by HALF if you prefer, or could go back to the Propranolol instead short term only.  For Weight Loss / Obesity only  Wegovy (same as Ozempic) weekly injection - start 0.25mg  weekly, 1 dose per pen, single use, auto-injector  2.  Saxenda - DAILY injection - start 0.6mg  injection DAILY, sample is 3 weeks, new needle each dose.  3. Contrave - pill, wellbutrin is a main ingredient, it has a mood component for appetite suppression.   Please schedule a Follow-up Appointment to: Return in about 3 months (around 02/08/2022) for 3 month follow-up HR / Weight management updates.  If you have any other questions or concerns, please feel free to call the office or send a message through MyChart. You may also schedule an earlier appointment if necessary.  Additionally, you may be receiving a survey about your experience at our office within a few days to 1 week by e-mail or mail. We value  your feedback.  02/10/2022, DO [facility], [facility]

## 2021-11-23 ENCOUNTER — Telehealth (INDEPENDENT_AMBULATORY_CARE_PROVIDER_SITE_OTHER): Payer: PRIVATE HEALTH INSURANCE | Admitting: Psychiatry

## 2021-11-23 ENCOUNTER — Encounter: Payer: Self-pay | Admitting: Psychiatry

## 2021-11-23 ENCOUNTER — Other Ambulatory Visit: Payer: Self-pay | Admitting: Urology

## 2021-11-23 DIAGNOSIS — F411 Generalized anxiety disorder: Secondary | ICD-10-CM

## 2021-11-23 DIAGNOSIS — Z634 Disappearance and death of family member: Secondary | ICD-10-CM | POA: Diagnosis not present

## 2021-11-23 DIAGNOSIS — F5101 Primary insomnia: Secondary | ICD-10-CM | POA: Diagnosis not present

## 2021-11-23 MED ORDER — VENLAFAXINE HCL ER 150 MG PO CP24: 150.0000 mg | ORAL_CAPSULE | Freq: Every day | ORAL | 0 refills | Status: DC

## 2021-11-23 NOTE — Patient Instructions (Addendum)
[facility]- [id number].

## 2021-11-23 NOTE — Progress Notes (Signed)
Virtual Visit via Video Note  I connected with Robert [name] on 11/23/21 at  1:00 PM EDT by a video enabled telemedicine application and verified that I am speaking with the correct person using two identifiers.  Location Provider Location : [facility] Patient Location : Car  Participants: Patient , Provider    I discussed the limitations of evaluation and management by telemedicine and the availability of in person appointments. The patient expressed understanding and agreed to proceed.   I discussed the assessment and treatment plan with the patient. The patient was provided an opportunity to ask questions and all were answered. The patient agreed with the plan and demonstrated an understanding of the instructions.   The patient was advised to call back or seek an in-person evaluation if the symptoms worsen or if the condition fails to improve as anticipated.   [place] MD OP Progress Note  11/23/2021 6:06 PM Hever Castilleja  MRN:  [id number]  Chief Complaint:  Chief Complaint  Patient presents with   Follow-up: 41 year old Caucasian male who has a history of GAD, insomnia, Tourette's disorder, presented for medication management.   HPI: Robert [name] is a 41 year old Caucasian male, lives in [place], employed, has a history of GAD, insomnia, history of Tourette's disorder, gastroesophageal reflux disease was evaluated by telemedicine today.  Patient currently grieving the loss of his father who passed away a month ago.  Patient reports his father had COVID-19 infection.  Patient is trying to cope with the loss at this time.  It does have an impact on his mood .  Patient is interested in grief counseling.  Also agrees to contact his previous psychotherapist-Mr. Debbe Bales.  Patient does report some anxiety, feeling nervous often.  Reports sleep was okay most of the time.  There are some nights when he struggles.  Does have doxepin available which helps.  Denies any suicidality,  homicidality or perceptual disturbances.  Patient is compliant on the venlafaxine.  Agreeable to dosage increase.  Patient does report he was prescribed Contrave by his primary provider.  Patient interested in knowing more about this medication as well as drug to drug interaction with his current psychotropics.  Denies any other concerns today.  Visit Diagnosis:    ICD-10-CM   1. GAD (generalized anxiety disorder)  F41.1 venlafaxine XR (EFFEXOR XR) 150 MG 24 hr capsule    2. Primary insomnia  F51.01     3. Bereavement  Z63.4       Past Psychiatric History: Reviewed past psychiatric history from progress note on 09/29/2020.  Past trials of Lexapro, Wellbutrin, BuSpar, venlafaxine.  Past Medical History:  Past Medical History:  Diagnosis Date   ADHD    Anxiety    GERD (gastroesophageal reflux disease)    Wears contact lenses     Past Surgical History:  Procedure Laterality Date   ESOPHAGOGASTRODUODENOSCOPY (EGD) WITH PROPOFOL N/A 06/14/2017   Procedure: ESOPHAGOGASTRODUODENOSCOPY (EGD) WITH PROPOFOL;  Surgeon: Lucilla Lame, MD;  Location: [facility];  Service: Endoscopy;  Laterality: N/A;   SEPTOPLASTY  06/13/2011   WISDOM TOOTH EXTRACTION      Family Psychiatric History: Reviewed family psychiatric history from progress note on 09/29/2020.  Family History:  Family History  Problem Relation Age of Onset   Depression Mother    Suicidality Mother    Cancer Maternal Grandfather 35       stomach   Esophageal cancer Maternal Grandfather 46   Depression Sister    Colon cancer Neg Hx  Prostate cancer Neg Hx    Diabetes Neg Hx    Hypertension Neg Hx    Hypercholesterolemia Neg Hx     Social History: Reviewed social history from progress note on 09/29/2020. Social History   Socioeconomic History   Marital status: Divorced    Spouse name: Not on file   Number of children: 1   Years of education: Vocational   Highest education level: Not on file  Occupational  History   Occupation: IT    Comment: Commuting to [employer]  Tobacco Use   Smoking status: Former    Packs/day: 1.00    Years: 15.00    Total pack years: 15.00    Types: Cigarettes    Quit date: 10/10/2016    Years since quitting: 5.1   Smokeless tobacco: Former   Tobacco comments:    Quit using vaping  Vaping Use   Vaping Use: Former   Quit date: 04/14/2018  Substance and Sexual Activity   Alcohol use: No   Drug use: No   Sexual activity: Yes    Birth control/protection: Surgical    Comment: Vasectomy 2013  Other Topics Concern   Not on file  Social History Narrative   Not on file   Social Determinants of Health   Financial Resource Strain: Not on file  Food Insecurity: Not on file  Transportation Needs: Not on file  Physical Activity: Not on file  Stress: Not on file  Social Connections: Not on file    Allergies: No Known Allergies  Metabolic Disorder Labs: Lab Results  Component Value Date   HGBA1C 5.8 (A) 11/08/2021   MPG 123 07/01/2021   MPG 120 09/22/2020   Lab Results  Component Value Date   PROLACTIN 8.9 05/16/2018   Lab Results  Component Value Date   CHOL 185 07/01/2021   TRIG 236 (H) 07/01/2021   HDL 38 (L) 07/01/2021   CHOLHDL 4.9 07/01/2021   LDLCALC 111 (H) 07/01/2021   LDLCALC 105 (H) 09/22/2020   Lab Results  Component Value Date   TSH 1.91 07/01/2021   TSH 1.85 09/22/2020    Therapeutic Level Labs: No results found for: "LITHIUM" No results found for: "VALPROATE" No results found for: "CBMZ"  Current Medications: Current Outpatient Medications  Medication Sig Dispense Refill   venlafaxine XR (EFFEXOR XR) 150 MG 24 hr capsule Take 1 capsule (150 mg total) by mouth daily with breakfast. 90 capsule 0   chlorhexidine (PERIDEX) 0.12 % solution SMARTSIG:By Mouth     CONTRAVE 8-90 MG TB12 Week 1: Take 1 tab daily with breakfast. Week 2: Take 1 tab twice daily with meal. Week 3: Take 2 tab with AM meal and 1 tab with PM meal. Week  4+: Take 2 tabs twice daily with meals - continue for weight loss 120 tablet 0   dicyclomine (BENTYL) 10 MG capsule Take 1 capsule (10 mg total) by mouth at bedtime as needed for spasms (cramping). 90 capsule 3   doxepin (SINEQUAN) 10 MG capsule Take 1-2 capsules (10-20 mg total) by mouth at bedtime as needed. SLEEP 60 capsule 1   finasteride (PROPECIA) 1 MG tablet Take 1 tablet by mouth daily.     metoprolol succinate (TOPROL-XL) 25 MG 24 hr tablet Take 1 tablet (25 mg total) by mouth daily. 90 tablet 3   NEEDLE, DISP, 18 G (BD DISP NEEDLES) 18G X 1-1/2" MISC Use 18G to draw up Testosterone medication 50 each 0   NEEDLE, DISP, 21 G (BD ECLIPSE NEEDLE)  21G X 1-1/2" MISC Use 21G needle to inject Testosterone medication. 50 each 0   PILOT COVID-19 AT-HOME TEST KIT      Syringe, Disposable, (2-3CC SYRINGE) 3 ML MISC Use as directed 100 each 0   testosterone cypionate (DEPOTESTOSTERONE CYPIONATE) 200 MG/ML injection Inject 1 mL (200 mg total) into the muscle every 14 (fourteen) days. 3 mL 0   No current facility-administered medications for this visit.     Musculoskeletal: Strength & Muscle Tone:  UTA Gait & Station:  Seated Patient leans:  NA  Psychiatric Specialty Exam: Review of Systems  Psychiatric/Behavioral:  The patient is nervous/anxious.        Grieving  All other systems reviewed and are negative.   There were no vitals taken for this visit.There is no height or weight on file to calculate BMI.  General Appearance: Casual  Eye Contact:  Fair  Speech:  Clear and Coherent  Volume:  Normal  Mood:   Grieving , anxious   Affect:  Congruent  Thought Process:  Goal Directed and Descriptions of Associations: Intact  Orientation:  Full (Time, Place, and Person)  Thought Content: Logical   Suicidal Thoughts:  No  Homicidal Thoughts:  No  Memory:  Immediate;   Fair Recent;   Fair Remote;   Fair  Judgement:  Fair  Insight:  Fair  Psychomotor Activity:  Normal  Concentration:   Concentration: Fair and Attention Span: Fair  Recall:  [employer] of Knowledge: Fair  Language: Fair  Akathisia:  No  Handed:  Right  AIMS (if indicated): done  Assets:  Communication Skills Desire for Improvement Housing Resilience Talents/Skills  ADL's:  Intact  Cognition: WNL  Sleep:  Fair   Screenings: AIMS    Flowsheet Row Video Visit from 11/23/2021 in [place] Total Score 0      [place] Office Visit from 11/08/2021 in [facility] Video Visit from 08/31/2021 in [place] Office Visit from 07/08/2021 in [facility] Video Visit from 06/30/2021 in [place] Office Visit from 04/01/2021 in [facility]  Total GAD-7 Score _0 WLS9-3    [place] Office Visit from 11/08/2021 in [facility] Video Visit from 08/31/2021 in [place] Office Visit from 07/08/2021 in [facility] Video Visit from 06/30/2021 in [place] Video Visit from 04/07/2021 in [place]  PHQ-2 Total Score 4 0 _1 PHQ-9 Total Score 11 -- 7 -- 4      Flowsheet Row Video Visit from 06/30/2021 in [place] Office Visit from 01/04/2021 in [place] Video Visit from 09/29/2020 in [place] No Risk No Risk No Risk        Assessment and Plan: Robert [name] is a 40 year old Caucasian male who has a history of GAD was evaluated by telemedicine today.  Patient is currently grieving the loss of his father.  Plan as noted below.  Plan GAD-unstable Increase venlafaxine extended release to 150 milligram p.o. daily Propranolol 10 mg p.o. 3 times daily as needed  Insomnia-improving Doxepin 10 to 20 mg p.o.  nightly as needed Continue CPAP   Bereavement-unstable Patient advised to schedule appointment with his therapist-Mr.Mostafa. Also provided information for hospice counseling.  Provided education regarding adverse side  effect, drug to drug interaction of Contrave with his venlafaxine as well as the effect of medications like Wellbutrin on his sleep.  Follow-up in clinic in 3 to 4 weeks or sooner if needed.   Collaboration of Care: Collaboration of Care: Referral or follow-up with counselor/therapist AEB encouraged to schedule an appointment with therapist.  Patient/Guardian was advised Release of Information must be obtained prior to any record release in order to collaborate their care with an outside provider. Patient/Guardian was advised if they have not already done so to contact the registration department to sign all necessary forms in order for [place] to release information regarding their care.   Consent: Patient/Guardian gives verbal consent for treatment and assignment of benefits for services provided during this visit. Patient/Guardian expressed understanding and agreed to proceed.   This note was generated in part or whole with voice recognition software. Voice recognition is usually quite accurate but there are transcription errors that can and very often do occur. I apologize for any typographical errors that were not detected and corrected.      Ursula Alert, MD 11/24/2021, 8:13 AM

## 2021-11-25 ENCOUNTER — Other Ambulatory Visit: Payer: Self-pay | Admitting: Family Medicine

## 2021-11-28 MED ORDER — TESTOSTERONE CYPIONATE 200 MG/ML IM SOLN
200.0000 mg | INTRAMUSCULAR | 0 refills | Status: DC
Start: 2021-11-28 — End: 2021-12-29

## 2021-11-28 NOTE — Telephone Encounter (Signed)
Needs a repeat hemoglobin/hematocrit.  I sent in 1 month refill until this is done

## 2021-12-02 ENCOUNTER — Other Ambulatory Visit: Payer: Self-pay | Admitting: Family Medicine

## 2021-12-02 MED ORDER — CONTRAVE 8-90 MG PO TB12: 2.0000 | ORAL_TABLET | Freq: Two times a day (BID) | ORAL | 2 refills | Status: DC

## 2021-12-20 ENCOUNTER — Telehealth (INDEPENDENT_AMBULATORY_CARE_PROVIDER_SITE_OTHER): Payer: PRIVATE HEALTH INSURANCE | Admitting: Psychiatry

## 2021-12-20 ENCOUNTER — Encounter: Payer: Self-pay | Admitting: Psychiatry

## 2021-12-20 DIAGNOSIS — Z634 Disappearance and death of family member: Secondary | ICD-10-CM | POA: Diagnosis not present

## 2021-12-20 DIAGNOSIS — F411 Generalized anxiety disorder: Secondary | ICD-10-CM | POA: Diagnosis not present

## 2021-12-20 DIAGNOSIS — F5101 Primary insomnia: Secondary | ICD-10-CM

## 2021-12-20 DIAGNOSIS — Z13228 Encounter for screening for other metabolic disorders: Secondary | ICD-10-CM | POA: Insufficient documentation

## 2021-12-20 NOTE — Progress Notes (Unsigned)
Virtual Visit via Video Note  I connected with Robert [name] on 12/20/21 at  2:40 PM EDT by a video enabled telemedicine application and verified that I am speaking with the correct person using two identifiers.  Location Provider Location : [facility] Patient Location : Home  Participants: Patient , Provider    I discussed the limitations of evaluation and management by telemedicine and the availability of in person appointments. The patient expressed understanding and agreed to proceed.    I discussed the assessment and treatment plan with the patient. The patient was provided an opportunity to ask questions and all were answered. The patient agreed with the plan and demonstrated an understanding of the instructions.   The patient was advised to call back or seek an in-person evaluation if the symptoms worsen or if the condition fails to improve as anticipated.   [place] MD OP Progress Note  12/20/2021 3:02 PM Robert [name]  MRN:  [id number]  Chief Complaint:  Chief Complaint  Patient presents with   Follow-up: 41 year old Caucasian male with history of anxiety, insomnia, Tourette's disorder, currently grieving the loss of his father presented for medication management.   HPI: Robert [name] is a 41 year old Caucasian male, lives in [place], employed, has a history of GAD, insomnia, bereavement, Tourette's disorder, gastroesophageal reflux disease was evaluated by telemedicine today.  Patient today reports since being on the higher dosage of venlafaxine his anxiety has improved.  Patient continues to grieve the loss of his father who passed away recently due to COVID-19 infection.  Patient however has been coping better.  Reports he has been compliant on the venlafaxine.  Denies side effects.  He does not like the effect of doxepin much which he takes for sleep.  He feels as though it makes him dizzy and although his body sleeps his mind is still awake for a few minutes after he takes it.   That does worry him.  He hence uses it only as needed and uses melatonin more frequently which has been helpful.  He is not interested in changing doxepin to another sleep medication at this time.  Patient reports he is currently trying to get into a weight loss program through his employer.  He stopped taking the contrave since he was worried about the interaction with his venlafaxine as well as the effect on his anxiety and sleep.  He is also planning to start going to the gym.  Currently walks with his dog.  Patient denies any suicidality, homicidality or perceptual disturbances.  Patient denies any other concerns today.  Visit Diagnosis:    ICD-10-CM   1. GAD (generalized anxiety disorder)  F41.1     2. Primary insomnia  F51.01     3. Bereavement  Z63.4       Past Psychiatric History: Reviewed past psychiatric history from progress note on 09/29/2020.  Past trials of Lexapro, Wellbutrin, BuSpar, venlafaxine.  Past Medical History:  Past Medical History:  Diagnosis Date   ADHD    Anxiety    GERD (gastroesophageal reflux disease)    Wears contact lenses     Past Surgical History:  Procedure Laterality Date   ESOPHAGOGASTRODUODENOSCOPY (EGD) WITH PROPOFOL N/A 06/14/2017   Procedure: ESOPHAGOGASTRODUODENOSCOPY (EGD) WITH PROPOFOL;  Surgeon: Lucilla Lame, MD;  Location: [facility];  Service: Endoscopy;  Laterality: N/A;   SEPTOPLASTY  06/13/2011   WISDOM TOOTH EXTRACTION      Family Psychiatric History: Reviewed family psychiatric history from progress note on 09/29/2020.  Family History:  Family History  Problem Relation Age of Onset   Depression Mother    Suicidality Mother    Cancer Maternal Grandfather 63       stomach   Esophageal cancer Maternal Grandfather 77   Depression Sister    Colon cancer Neg Hx    Prostate cancer Neg Hx    Diabetes Neg Hx    Hypertension Neg Hx    Hypercholesterolemia Neg Hx     Social History: Reviewed social history from  progress note on 09/29/2020.   Social History   Socioeconomic History   Marital status: Divorced    Spouse name: Not on file   Number of children: 1   Years of education: Vocational   Highest education level: Not on file  Occupational History   Occupation: IT    Comment: Commuting to [employer]  Tobacco Use   Smoking status: Former    Packs/day: 1.00    Years: 15.00    Total pack years: 15.00    Types: Cigarettes    Quit date: 10/10/2016    Years since quitting: 5.2   Smokeless tobacco: Former   Tobacco comments:    Quit using vaping  Vaping Use   Vaping Use: Former   Quit date: 04/14/2018  Substance and Sexual Activity   Alcohol use: No   Drug use: No   Sexual activity: Yes    Birth control/protection: Surgical    Comment: Vasectomy 2013  Other Topics Concern   Not on file  Social History Narrative   Not on file   Social Determinants of Health   Financial Resource Strain: Not on file  Food Insecurity: Not on file  Transportation Needs: Not on file  Physical Activity: Not on file  Stress: Not on file  Social Connections: Not on file    Allergies: No Known Allergies  Metabolic Disorder Labs: Lab Results  Component Value Date   HGBA1C 5.8 (A) 11/08/2021   MPG 123 07/01/2021   MPG 120 09/22/2020   Lab Results  Component Value Date   PROLACTIN 8.9 05/16/2018   Lab Results  Component Value Date   CHOL 185 07/01/2021   TRIG 236 (H) 07/01/2021   HDL 38 (L) 07/01/2021   CHOLHDL 4.9 07/01/2021   LDLCALC 111 (H) 07/01/2021   LDLCALC 105 (H) 09/22/2020   Lab Results  Component Value Date   TSH 1.91 07/01/2021   TSH 1.85 09/22/2020    Therapeutic Level Labs: No results found for: "LITHIUM" No results found for: "VALPROATE" No results found for: "CBMZ"  Current Medications: Current Outpatient Medications  Medication Sig Dispense Refill   chlorhexidine (PERIDEX) 0.12 % solution SMARTSIG:By Mouth     doxepin (SINEQUAN) 10 MG capsule Take 1-2  capsules (10-20 mg total) by mouth at bedtime as needed. SLEEP 60 capsule 1   metoprolol succinate (TOPROL-XL) 25 MG 24 hr tablet Take 1 tablet (25 mg total) by mouth daily. 90 tablet 3   NEEDLE, DISP, 18 G (BD DISP NEEDLES) 18G X 1-1/2" MISC Use 18G to draw up Testosterone medication 50 each 0   NEEDLE, DISP, 21 G (BD ECLIPSE NEEDLE) 21G X 1-1/2" MISC Use 21G needle to inject Testosterone medication. 50 each 0   PILOT COVID-19 AT-HOME TEST KIT      Syringe, Disposable, (2-3CC SYRINGE) 3 ML MISC Use as directed 100 each 0   testosterone cypionate (DEPOTESTOSTERONE CYPIONATE) 200 MG/ML injection Inject 1 mL (200 mg total) into the muscle every 14 (fourteen) days. 2 mL 0  venlafaxine XR (EFFEXOR XR) 150 MG 24 hr capsule Take 1 capsule (150 mg total) by mouth daily with breakfast. 90 capsule 0   dicyclomine (BENTYL) 10 MG capsule Take 1 capsule (10 mg total) by mouth at bedtime as needed for spasms (cramping). (Patient not taking: Reported on 12/20/2021) 90 capsule 3   finasteride (PROPECIA) 1 MG tablet Take 1 tablet by mouth daily. (Patient not taking: Reported on 12/20/2021)     No current facility-administered medications for this visit.     Musculoskeletal: Strength & Muscle Tone:  UTA Gait & Station:  Seated Patient leans: N/A  Psychiatric Specialty Exam: Review of Systems  Psychiatric/Behavioral:         Grieving  All other systems reviewed and are negative.   There were no vitals taken for this visit.There is no height or weight on file to calculate BMI.  General Appearance: Casual  Eye Contact:  Fair  Speech:  Clear and Coherent  Volume:  Normal  Mood:   Grieving  Affect:  Appropriate  Thought Process:  Goal Directed and Descriptions of Associations: Intact  Orientation:  Full (Time, Place, and Person)  Thought Content: Logical   Suicidal Thoughts:  No  Homicidal Thoughts:  No  Memory:  Immediate;   Fair Recent;   Fair Remote;   Fair  Judgement:  Fair  Insight:  Fair   Psychomotor Activity:  Normal  Concentration:  Concentration: Fair and Attention Span: Fair  Recall:  [employer] of Knowledge: Fair  Language: Fair  Akathisia:  No  Handed:  Right  AIMS (if indicated): not done  Assets:  Communication Skills Desire for [employer]/Skills Transportation  ADL's:  Intact  Cognition: WNL  Sleep:  Fair   Screenings: AIMS    Flowsheet Row Video Visit from 11/23/2021 in [place] Total Score 0      GAD-7    Flowsheet Row Video Visit from 12/20/2021 in [place] Office Visit from 11/08/2021 in [facility] Video Visit from 08/31/2021 in [place] Office Visit from 07/08/2021 in [facility] Video Visit from 06/30/2021 in [place]  Total GAD-7 Score '5 8 4 5 3      ' PHQ2-9    Flowsheet Row Video Visit from 12/20/2021 in [place] Office Visit from 11/08/2021 in [facility] Video Visit from 08/31/2021 in [place] Office Visit from 07/08/2021 in [facility] Video Visit from 06/30/2021 in [place]  PHQ-2 Total Score 1 4 0 3 1  PHQ-9 Total Score -- 11 -- 7 --      Flowsheet Row Video Visit from 12/20/2021 in [place] Video Visit from 06/30/2021 in [place] Office Visit from 01/04/2021 in [place] No Risk No Risk No Risk        Assessment and Plan: Robert [name] is a 41 year old Caucasian male who has a history of GAD was evaluated by telemedicine today.  Patient is currently grieving the loss of his father although coping better.  Plan as noted below.  Plan GAD-improving Venlafaxine extended release 150 mg p.o. daily Patient  currently taking metoprolol per his primary care provider, currently not taking the propranolol.   Insomnia-improving Continue doxepin 10 to 20 mg p.o. nightly as needed Continue CPAP He also has melatonin available. We will consider changing  doxepin to another sleep medication as needed.  Bereavement-improving Will continue to monitor.  Follow-up in clinic in 3 months or sooner if needed.   Consent: Patient/Guardian gives verbal consent for treatment and assignment of benefits for services provided during this visit. Patient/Guardian expressed understanding and agreed to proceed.   This note was generated in part or whole with voice recognition software. Voice recognition is usually quite accurate but there are transcription errors that can and very often do occur. I apologize for any typographical errors that were not detected and corrected.      Ursula Alert, MD 12/21/2021, 12:51 PM

## 2021-12-28 ENCOUNTER — Other Ambulatory Visit: Payer: Self-pay | Admitting: Urology

## 2021-12-29 ENCOUNTER — Other Ambulatory Visit: Payer: Self-pay | Admitting: Urology

## 2021-12-30 ENCOUNTER — Other Ambulatory Visit: Payer: Self-pay | Admitting: Family Medicine

## 2021-12-30 NOTE — Telephone Encounter (Signed)
Patient called asking for a refill of the testosterone. Per Dr Lonna Cobb he is not able to refill the Testosterone until patient donates blood as his HCT level is to high. Patient is to donate blood and call ou office to schedule an appointment for a HCT recheck before he can get a refill of the medication.

## 2021-12-30 NOTE — Telephone Encounter (Signed)
Pt LM on triage line requesting refill for Testosterone, please advise

## 2022-01-01 MED ORDER — TESTOSTERONE CYPIONATE 200 MG/ML IM SOLN: 200.0000 mg | INTRAMUSCULAR | 0 refills | Status: DC

## 2022-01-18 ENCOUNTER — Encounter: Payer: Self-pay | Admitting: Family Medicine

## 2022-01-18 ENCOUNTER — Telehealth: Payer: Self-pay | Admitting: Urology

## 2022-01-18 ENCOUNTER — Other Ambulatory Visit: Payer: Self-pay | Admitting: *Deleted

## 2022-01-18 DIAGNOSIS — E291 Testicular hypofunction: Secondary | ICD-10-CM

## 2022-01-18 DIAGNOSIS — Z7251 High risk heterosexual behavior: Secondary | ICD-10-CM

## 2022-01-18 DIAGNOSIS — Z113 Encounter for screening for infections with a predominantly sexual mode of transmission: Secondary | ICD-10-CM

## 2022-01-18 NOTE — Telephone Encounter (Signed)
Can you please put orders in for pt's labs?  He said hematocrit and test. Levels.

## 2022-01-19 ENCOUNTER — Other Ambulatory Visit (HOSPITAL_COMMUNITY)
Admission: RE | Admit: 2022-01-19 | Discharge: 2022-01-19 | Disposition: A | Discharge status: Home / Self Care | Payer: BC Managed Care – PPO | Source: Ambulatory Visit | Attending: Family Medicine | Admitting: Family Medicine

## 2022-01-19 ENCOUNTER — Other Ambulatory Visit: Payer: BC Managed Care – PPO

## 2022-01-19 ENCOUNTER — Other Ambulatory Visit: Payer: Self-pay | Admitting: Family Medicine

## 2022-01-19 DIAGNOSIS — Z113 Encounter for screening for infections with a predominantly sexual mode of transmission: Secondary | ICD-10-CM | POA: Insufficient documentation

## 2022-01-19 DIAGNOSIS — Z7251 High risk heterosexual behavior: Secondary | ICD-10-CM

## 2022-01-19 DIAGNOSIS — E291 Testicular hypofunction: Secondary | ICD-10-CM

## 2022-01-20 ENCOUNTER — Telehealth: Payer: Self-pay | Admitting: Urology

## 2022-01-20 ENCOUNTER — Encounter: Payer: Self-pay | Admitting: *Deleted

## 2022-01-20 LAB — TESTOSTERONE: Testosterone: 105 ng/dL — ABNORMAL LOW (ref 264–916)

## 2022-01-20 LAB — HIV ANTIBODY (ROUTINE TESTING W REFLEX): HIV 1&2 Ab, 4th Generation: NONREACTIVE

## 2022-01-20 LAB — HEMATOCRIT: Hematocrit: 48.2 % (ref 37.5–51.0)

## 2022-01-20 LAB — RPR: RPR Ser Ql: NONREACTIVE

## 2022-01-20 NOTE — Telephone Encounter (Signed)
Hematocrit back to normal at 48.  Testosterone level is 105.  A 1 month supply of testosterone was sent to his pharmacy on 01/01/2022.  When was his last testosterone injection?

## 2022-01-23 ENCOUNTER — Encounter: Payer: Self-pay | Admitting: *Deleted

## 2022-01-23 LAB — GC/CHLAMYDIA PROBE AMP (~~LOC~~) NOT AT ARMC
Chlamydia: NEGATIVE
Comment: NEGATIVE
Comment: NORMAL
Neisseria Gonorrhea: NEGATIVE

## 2022-01-24 ENCOUNTER — Encounter: Payer: Self-pay | Admitting: *Deleted

## 2022-01-24 ENCOUNTER — Other Ambulatory Visit: Payer: Self-pay | Admitting: Urology

## 2022-01-24 MED ORDER — TESTOSTERONE CYPIONATE 200 MG/ML IM SOLN
200.0000 mg | INTRAMUSCULAR | 0 refills | Status: DC
Start: 2022-01-24 — End: 2022-08-04

## 2022-02-17 ENCOUNTER — Telehealth (INDEPENDENT_AMBULATORY_CARE_PROVIDER_SITE_OTHER): Payer: PRIVATE HEALTH INSURANCE | Admitting: Psychiatry

## 2022-02-17 ENCOUNTER — Encounter: Payer: Self-pay | Admitting: Psychiatry

## 2022-02-17 DIAGNOSIS — F5101 Primary insomnia: Secondary | ICD-10-CM | POA: Diagnosis not present

## 2022-02-17 DIAGNOSIS — F411 Generalized anxiety disorder: Secondary | ICD-10-CM | POA: Diagnosis not present

## 2022-02-17 MED ORDER — ZOLPIDEM TARTRATE 5 MG PO TABS: 2.5000 mg | ORAL_TABLET | Freq: Every evening | ORAL | 0 refills | Status: DC | PRN

## 2022-02-17 MED ORDER — VENLAFAXINE HCL ER 150 MG PO CP24: 150.0000 mg | ORAL_CAPSULE | Freq: Every day | ORAL | 0 refills | Status: DC

## 2022-02-17 NOTE — Progress Notes (Signed)
Virtual Visit via Video Note  I connected with Robert [name] on 02/17/22 at 11:40 AM EDT by a video enabled telemedicine application and verified that I am speaking with the correct person using two identifiers.  Location Provider Location : [facility] Patient Location : Home  Participants: Patient , Provider    I discussed the limitations of evaluation and management by telemedicine and the availability of in person appointments. The patient expressed understanding and agreed to proceed.   I discussed the assessment and treatment plan with the patient. The patient was provided an opportunity to ask questions and all were answered. The patient agreed with the plan and demonstrated an understanding of the instructions.   The patient was advised to call back or seek an in-person evaluation if the symptoms worsen or if the condition fails to improve as anticipated.   Bairoa La Veinticinco MD OP Progress Note  02/17/2022 2:37 PM Robert [name]  MRN:  [id number]  Chief Complaint:  Chief Complaint  Patient presents with   Follow-up: 41 year old Caucasian male who has a history of GAD, insomnia, Tourette's presented with sleep problems.   HPI: Robert [name] is a 41 year old Caucasian male who lives in [place], employed, has a history of GAD, insomnia, Tourette's disorder, gastroesophageal reflux disease was evaluated by telemedicine today.  Patient today reports his anxiety is currently under control.  The venlafaxine seems to be helpful.  He was pretty busy at work the past couple of weeks.  However he has been handling it well although he is looking forward to getting a break.  Patient reports sleep continues to be restless.  Reports he has difficulty falling asleep as well as maintaining sleep.  He does not like the effect of doxepin since it does not help him to fall asleep.  Although it does help to maintain his sleep.  However there is lingering effect in the morning especially with the higher dosage and he  does not like that.  Agreeable to trial of Ambien.  Patient denies any suicidality, homicidality or perceptual disturbances.  Reports he is currently in a weight loss program.  He may have lost 20 pounds in the past few weeks.  Continues to be motivated to stay in the program.  Patient denies any other concerns today.      Visit Diagnosis:    ICD-10-CM   1. GAD (generalized anxiety disorder)  F41.1 venlafaxine XR (EFFEXOR XR) 150 MG 24 hr capsule    2. Primary insomnia  F51.01 zolpidem (AMBIEN) 5 MG tablet      Past Psychiatric History: Reviewed past psychiatric history from progress note on 09/29/2020.  Past trials of Lexapro, Wellbutrin, BuSpar, venlafaxine.  Past Medical History:  Past Medical History:  Diagnosis Date   ADHD    Anxiety    GERD (gastroesophageal reflux disease)    Wears contact lenses     Past Surgical History:  Procedure Laterality Date   ESOPHAGOGASTRODUODENOSCOPY (EGD) WITH PROPOFOL N/A 06/14/2017   Procedure: ESOPHAGOGASTRODUODENOSCOPY (EGD) WITH PROPOFOL;  Surgeon: Lucilla Lame, MD;  Location: [facility];  Service: Endoscopy;  Laterality: N/A;   SEPTOPLASTY  06/13/2011   WISDOM TOOTH EXTRACTION      Family Psychiatric History: Reviewed family psychiatric history from progress note on 09/29/2020.  Family History:  Family History  Problem Relation Age of Onset   Depression Mother    Suicidality Mother    Cancer Maternal Grandfather 31       stomach   Esophageal cancer Maternal Grandfather 25   Depression  Sister    Colon cancer Neg Hx    Prostate cancer Neg Hx    Diabetes Neg Hx    Hypertension Neg Hx    Hypercholesterolemia Neg Hx     Social History: Reviewed social history from progress note on 09/29/2020. Social History   Socioeconomic History   Marital status: Divorced    Spouse name: Not on file   Number of children: 1   Years of education: Vocational   Highest education level: Not on file  Occupational History    Occupation: IT    Comment: Commuting to [employer]  Tobacco Use   Smoking status: Former    Packs/day: 1.00    Years: 15.00    Total pack years: 15.00    Types: Cigarettes    Quit date: 10/10/2016    Years since quitting: 5.3   Smokeless tobacco: Former   Tobacco comments:    Quit using vaping  Vaping Use   Vaping Use: Former   Quit date: 04/14/2018  Substance and Sexual Activity   Alcohol use: No   Drug use: No   Sexual activity: Yes    Birth control/protection: Surgical    Comment: Vasectomy 2013  Other Topics Concern   Not on file  Social History Narrative   Not on file   Social Determinants of Health   Financial Resource Strain: Not on file  Food Insecurity: Not on file  Transportation Needs: Not on file  Physical Activity: Not on file  Stress: Not on file  Social Connections: Not on file    Allergies: No Known Allergies  Metabolic Disorder Labs: Lab Results  Component Value Date   HGBA1C 5.8 (A) 11/08/2021   MPG 123 07/01/2021   MPG 120 09/22/2020   Lab Results  Component Value Date   PROLACTIN 8.9 05/16/2018   Lab Results  Component Value Date   CHOL 185 07/01/2021   TRIG 236 (H) 07/01/2021   HDL 38 (L) 07/01/2021   CHOLHDL 4.9 07/01/2021   LDLCALC 111 (H) 07/01/2021   LDLCALC 105 (H) 09/22/2020   Lab Results  Component Value Date   TSH 1.91 07/01/2021   TSH 1.85 09/22/2020    Therapeutic Level Labs: No results found for: "LITHIUM" No results found for: "VALPROATE" No results found for: "CBMZ"  Current Medications: Current Outpatient Medications  Medication Sig Dispense Refill   chlorhexidine (PERIDEX) 0.12 % solution SMARTSIG:By Mouth     metoprolol succinate (TOPROL-XL) 25 MG 24 hr tablet Take 1 tablet (25 mg total) by mouth daily. 90 tablet 3   NEEDLE, DISP, 18 G (BD DISP NEEDLES) 18G X 1-1/2" MISC Use 18G to draw up Testosterone medication 50 each 0   NEEDLE, DISP, 21 G (BD ECLIPSE NEEDLE) 21G X 1-1/2" MISC Use 21G needle to inject  Testosterone medication. 50 each 0   testosterone cypionate (DEPOTESTOSTERONE CYPIONATE) 200 MG/ML injection Inject 1 mL (200 mg total) into the muscle every 14 (fourteen) days. 6 mL 0   zolpidem (AMBIEN) 5 MG tablet Take 0.5-1 tablets (2.5-5 mg total) by mouth at bedtime as needed for sleep. 10 tablet 0   PILOT COVID-19 AT-HOME TEST KIT  (Patient not taking: Reported on 02/17/2022)     Syringe, Disposable, (2-3CC SYRINGE) 3 ML MISC Use as directed (Patient not taking: Reported on 02/17/2022) 100 each 0   venlafaxine XR (EFFEXOR XR) 150 MG 24 hr capsule Take 1 capsule (150 mg total) by mouth daily with breakfast. 90 capsule 0   No current facility-administered medications  for this visit.     Musculoskeletal: Strength & Muscle Tone:  UTA Gait & Station:  Seated Patient leans: N/A  Psychiatric Specialty Exam: Review of Systems  Psychiatric/Behavioral:  Positive for sleep disturbance. The patient is nervous/anxious.   All other systems reviewed and are negative.   There were no vitals taken for this visit.There is no height or weight on file to calculate BMI.  General Appearance: Casual  Eye Contact:  Fair  Speech:  Normal Rate  Volume:  Normal  Mood:  Anxious coping well  Affect:  Appropriate  Thought Process:  Goal Directed and Descriptions of Associations: Intact  Orientation:  Full (Time, Place, and Person)  Thought Content: Logical   Suicidal Thoughts:  No  Homicidal Thoughts:  No  Memory:  Immediate;   Fair Recent;   Fair Remote;   Fair  Judgement:  Fair  Insight:  Fair  Psychomotor Activity:  Normal  Concentration:  Concentration: Fair and Attention Span: Fair  Recall:  [employer] of Knowledge: Fair  Language: Fair  Akathisia:  No  Handed:  Right  AIMS (if indicated): not done  Assets:  Communication Skills Desire for Improvement Housing Social Support  ADL's:  Intact  Cognition: WNL  Sleep:  Poor   Screenings: AIMS    Flowsheet Row Video Visit from 11/23/2021  in [place] Total Score 0      GAD-7    Flowsheet Row Video Visit from 02/17/2022 in [place] Video Visit from 12/20/2021 in [place] Office Visit from 11/08/2021 in [facility] Video Visit from 08/31/2021 in [place] Office Visit from 07/08/2021 in [facility]  Total GAD-7 Score '4 5 8 4 5      ' PHQ2-9    Flowsheet Row Video Visit from 02/17/2022 in [place] Video Visit from 12/20/2021 in [place] Office Visit from 11/08/2021 in [facility] Video Visit from 08/31/2021 in [place] Office Visit from 07/08/2021 in [facility]  PHQ-2 Total Score 0 1 4 0 3  PHQ-9 Total Score -- -- 11 -- 7      Flowsheet Row Video Visit from 02/17/2022 in [place] Video Visit from 12/20/2021 in [place] Video Visit from 06/30/2021 in [place] No Risk No Risk No Risk        Assessment and Plan: Yasseen Salls is a 41 year old Caucasian male who has a history of GAD was evaluated by telemedicine today.  Patient is currently doing well with regards to his anxiety however does struggle with sleep problems.  Discussed plan as noted below.  Plan GAD-stable Venlafaxine extended release 150 mg p.o. daily.  Insomnia-unstable Discontinue doxepin. Continue CPAP Start low dosage Ambien 2.5-5 mg p.o. nightly as needed.  Advised to limit use.  Discussed side effects including sleep complex behaviors, habit-forming potential. I have reviewed [place] PMP aware. Patient to continue sleep hygiene techniques.  Follow-up in clinic in 6 weeks or sooner if needed.     Consent: Patient/Guardian gives verbal consent for treatment and  assignment of benefits for services provided during this visit. Patient/Guardian expressed understanding and agreed to proceed.   This note was generated in part or whole with voice recognition software. Voice recognition is usually quite accurate but there are transcription errors that can and very often do occur. I  apologize for any typographical errors that were not detected and corrected.      Ursula Alert, MD 02/17/2022, 2:37 PM

## 2022-03-08 ENCOUNTER — Other Ambulatory Visit: Payer: Self-pay

## 2022-03-08 DIAGNOSIS — E291 Testicular hypofunction: Secondary | ICD-10-CM

## 2022-03-14 ENCOUNTER — Other Ambulatory Visit: Payer: BC Managed Care – PPO

## 2022-03-14 DIAGNOSIS — E291 Testicular hypofunction: Secondary | ICD-10-CM | POA: Diagnosis not present

## 2022-03-15 ENCOUNTER — Encounter: Payer: Self-pay | Admitting: Urology

## 2022-03-15 LAB — HEMATOCRIT: Hematocrit: 53.1 % — ABNORMAL HIGH (ref 37.5–51.0)

## 2022-03-15 LAB — TESTOSTERONE: Testosterone: 1040 ng/dL — ABNORMAL HIGH (ref 264–916)

## 2022-03-16 ENCOUNTER — Other Ambulatory Visit: Payer: Self-pay | Admitting: Family Medicine

## 2022-03-16 DIAGNOSIS — E291 Testicular hypofunction: Secondary | ICD-10-CM

## 2022-03-16 NOTE — Telephone Encounter (Signed)
Repeat testosterone level and hematocrit in 4 weeks. Appointment made

## 2022-04-12 ENCOUNTER — Encounter: Payer: Self-pay | Admitting: Psychiatry

## 2022-04-12 ENCOUNTER — Ambulatory Visit (INDEPENDENT_AMBULATORY_CARE_PROVIDER_SITE_OTHER): Payer: PRIVATE HEALTH INSURANCE | Admitting: Psychiatry

## 2022-04-12 VITALS — BP 129/81 | HR 82 | Temp 98.0°F | Ht 66.0 in | Wt 219.0 lb

## 2022-04-12 DIAGNOSIS — F411 Generalized anxiety disorder: Secondary | ICD-10-CM | POA: Diagnosis not present

## 2022-04-12 DIAGNOSIS — F5101 Primary insomnia: Secondary | ICD-10-CM

## 2022-04-12 MED ORDER — VENLAFAXINE HCL ER 37.5 MG PO CP24: 37.5000 mg | ORAL_CAPSULE | Freq: Every day | ORAL | 0 refills | Status: DC

## 2022-04-12 MED ORDER — VENLAFAXINE HCL ER 75 MG PO CP24: 75.0000 mg | ORAL_CAPSULE | Freq: Every day | ORAL | 0 refills | Status: DC

## 2022-04-12 NOTE — Patient Instructions (Signed)
  www.[email or web address].org  www.psychologytoday  [facility] [email or web address] [address], [place], [place] [address]   [phone]  [facility] [email or web address] [address], [place], [place] [address]  [phone]   [facility] - [phone]  [facility] - [phone]  [facility] - [phone]   [facility] - [phone]  [facility] - [phone]     

## 2022-04-12 NOTE — Progress Notes (Signed)
[facility] MD OP Progress Note  04/12/2022 5:09 PM Robert [name]  MRN:  [id number]  Chief Complaint:  Chief Complaint  Patient presents with   Follow-up   Anxiety   Depression   Medication Refill   HPI: Robert [name] is a 41 year old Caucasian male who lives in [place], employed, has a history of GAD, insomnia, Tourette's disorder, gastroesophageal reflux disease was evaluated in office today.  Patient today reports overall he has been doing fairly well with regards to his mood.  Denies any significant depression symptoms.  Although anxious and worries he has been managing okay.  He does continue to have sleep problems.  However he has more good days than bad days now.  He goes to bed at around 10 PM and is awake by around 6:30 AM.  Days that he is able to sleep and he is able to sleep until 9 AM.  Patient reports he tried the Ambien only 1 night since he wanted someone to be with him when he tried it.  He woke up groggy the next day although it seemed to help.  He is agreeable to trying it again.  Patient reports he is worried about being on medications like testosterone which he is on which is potentially causing some side effects like weight gain.  Patient reports he is planning to have a discussion with his provider about this.  Patient reports he would like to reduce the dosage of venlafaxine due to sexual side effects as well as since he does not want to be on any medications at high dosages for too long.  He would like to go back to his previous dosage of 112.5 mg.  Patient denies any suicidality, homicidality or perceptual disturbances.  Patient reports work is going well.  Denies any other concerns today.  Visit Diagnosis:    ICD-10-CM   1. GAD (generalized anxiety disorder)  F41.1 venlafaxine XR (EFFEXOR-XR) 75 MG 24 hr capsule    venlafaxine XR (EFFEXOR-XR) 37.5 MG 24 hr capsule    2. Primary insomnia  F51.01       Past Psychiatric History: Reviewed past psychiatric history  from progress note on 09/29/2020.  Past trials of Lexapro, Wellbutrin, BuSpar, venlafaxine.  Past Medical History:  Past Medical History:  Diagnosis Date   ADHD    Anxiety    GERD (gastroesophageal reflux disease)    Wears contact lenses     Past Surgical History:  Procedure Laterality Date   ESOPHAGOGASTRODUODENOSCOPY (EGD) WITH PROPOFOL N/A 06/14/2017   Procedure: ESOPHAGOGASTRODUODENOSCOPY (EGD) WITH PROPOFOL;  Surgeon: Lucilla Lame, MD;  Location: [facility];  Service: Endoscopy;  Laterality: N/A;   SEPTOPLASTY  06/13/2011   WISDOM TOOTH EXTRACTION      Family Psychiatric History: Reviewed family psychiatric history from progress note on 09/29/2020.  Family History:  Family History  Problem Relation Age of Onset   Depression Mother    Suicidality Mother    Cancer Maternal Grandfather 30       stomach   Esophageal cancer Maternal Grandfather 3   Depression Sister    Colon cancer Neg Hx    Prostate cancer Neg Hx    Diabetes Neg Hx    Hypertension Neg Hx    Hypercholesterolemia Neg Hx     Social History: Reviewed social history from progress note on 09/29/2020. Social History   Socioeconomic History   Marital status: Divorced    Spouse name: Not on file   Number of children: 1   Years of  education: Vocational   Highest education level: Not on file  Occupational History   Occupation: IT    Comment: Commuting to [employer]  Tobacco Use   Smoking status: Former    Packs/day: 1.00    Years: 15.00    Total pack years: 15.00    Types: Cigarettes    Quit date: 10/10/2016    Years since quitting: 5.5   Smokeless tobacco: Former   Tobacco comments:    Quit using vaping  Vaping Use   Vaping Use: Former   Quit date: 04/14/2018  Substance and Sexual Activity   Alcohol use: No   Drug use: No   Sexual activity: Yes    Birth control/protection: Surgical    Comment: Vasectomy 2013  Other Topics Concern   Not on file  Social History Narrative   Not on file    Social Determinants of Health   Financial Resource Strain: Not on file  Food Insecurity: Not on file  Transportation Needs: Not on file  Physical Activity: Not on file  Stress: Not on file  Social Connections: Not on file    Allergies: No Known Allergies  Metabolic Disorder Labs: Lab Results  Component Value Date   HGBA1C 5.8 (A) 11/08/2021   MPG 123 07/01/2021   MPG 120 09/22/2020   Lab Results  Component Value Date   PROLACTIN 8.9 05/16/2018   Lab Results  Component Value Date   CHOL 185 07/01/2021   TRIG 236 (H) 07/01/2021   HDL 38 (L) 07/01/2021   CHOLHDL 4.9 07/01/2021   LDLCALC 111 (H) 07/01/2021   LDLCALC 105 (H) 09/22/2020   Lab Results  Component Value Date   TSH 1.91 07/01/2021   TSH 1.85 09/22/2020    Therapeutic Level Labs: No results found for: "LITHIUM" No results found for: "VALPROATE" No results found for: "CBMZ"  Current Medications: Current Outpatient Medications  Medication Sig Dispense Refill   chlorhexidine (PERIDEX) 0.12 % solution SMARTSIG:By Mouth     metoprolol succinate (TOPROL-XL) 25 MG 24 hr tablet Take 1 tablet (25 mg total) by mouth daily. 90 tablet 3   NEEDLE, DISP, 18 G (BD DISP NEEDLES) 18G X 1-1/2" MISC Use 18G to draw up Testosterone medication 50 each 0   NEEDLE, DISP, 21 G (BD ECLIPSE NEEDLE) 21G X 1-1/2" MISC Use 21G needle to inject Testosterone medication. 50 each 0   Syringe, Disposable, (2-3CC SYRINGE) 3 ML MISC Use as directed 100 each 0   testosterone cypionate (DEPOTESTOSTERONE CYPIONATE) 200 MG/ML injection Inject 1 mL (200 mg total) into the muscle every 14 (fourteen) days. 6 mL 0   venlafaxine XR (EFFEXOR-XR) 37.5 MG 24 hr capsule Take 1 capsule (37.5 mg total) by mouth daily with breakfast. Take along with 75 mg daily 90 capsule 0   venlafaxine XR (EFFEXOR-XR) 75 MG 24 hr capsule Take 1 capsule (75 mg total) by mouth daily with breakfast. Take along with 37.5 mg daily 90 capsule 0   zolpidem (AMBIEN) 5 MG  tablet Take 0.5-1 tablets (2.5-5 mg total) by mouth at bedtime as needed for sleep. 10 tablet 0   PILOT COVID-19 AT-HOME TEST KIT  (Patient not taking: Reported on 04/12/2022)     No current facility-administered medications for this visit.     Musculoskeletal: Strength & Muscle Tone: within normal limits Gait & Station: normal Patient leans: N/A  Psychiatric Specialty Exam: Review of Systems  Psychiatric/Behavioral:  Positive for sleep disturbance. The patient is nervous/anxious.   All other systems reviewed and  are negative.   Blood pressure 129/81, pulse 82, temperature 98 F (36.7 C), temperature source Oral, height _0  (1.676 m), weight 219 lb (99.3 kg).Body mass index is 35.35 kg/m.  General Appearance: Casual  Eye Contact:  Good  Speech:  Clear and Coherent  Volume:  Normal  Mood:  Anxious  Affect:  Appropriate  Thought Process:  Goal Directed and Descriptions of Associations: Intact  Orientation:  Full (Time, Place, and Person)  Thought Content: Logical   Suicidal Thoughts:  No  Homicidal Thoughts:  No  Memory:  Immediate;   Fair Recent;   Fair Remote;   Fair  Judgement:  Fair  Insight:  Fair  Psychomotor Activity:  Normal  Concentration:  Concentration: Fair and Attention Span: Fair  Recall:  [employer] of Knowledge: Fair  Language: Fair  Akathisia:  No  Handed:  Right  AIMS (if indicated): not done  Assets:  Communication Skills Desire for [place] Talents/Skills Transportation  ADL's:  Intact  Cognition: WNL  Sleep:   Improving   Screenings: AIMS    Flowsheet Row Video Visit from 11/23/2021 in [facility] Total Score 0      GAD-7    [place] Visit from 04/12/2022 in [facility] Video Visit from 02/17/2022 in [facility] Video Visit from 12/20/2021 in [facility] Office Visit from  11/08/2021 in [facility] Video Visit from 08/31/2021 in [facility]  Total GAD-7 Score _1 PHQ2-9    [place] Visit from 04/12/2022 in [facility] Video Visit from 02/17/2022 in [facility] Video Visit from 12/20/2021 in [facility] Office Visit from 11/08/2021 in [facility] Video Visit from 08/31/2021 in [facility]  PHQ-2 Total Score 1 0 1 4 0  PHQ-9 Total Score 4 -- -- 11 --      [facility] Office Visit from 04/12/2022 in [facility] Video Visit from 02/17/2022 in [facility] Video Visit from 12/20/2021 in [facility] No Risk No Risk No Risk        Assessment and Plan: Draydon Clairmont is a 41 year old Caucasian male who has a history of GAD was evaluated in the office today.  Patient is currently improving with regards to his mood as well as sleep, will benefit from the following plan.  Plan GAD-stable Will reduce venlafaxine extended release to 112.5 mg p.o. daily  Insomnia-improving Continue Ambien 2.5-5 mg p.o. nightly as needed.  Encouraged to use it more frequently since he has not used it more than once since his last visit. Could also try over-the-counter medications like Relaxium, Sleep3. Continue CPAP. Current use sleep hygiene techniques  Follow-up in clinic in 3 months or sooner if needed.     This note was generated in part or whole with voice recognition software. Voice recognition is usually quite accurate but there are transcription errors that can and very often do occur. I apologize for any typographical errors that were not detected and corrected.    Ursula Alert, MD 04/12/2022, 5:09 PM

## 2022-04-12 NOTE — Progress Notes (Deleted)
error 

## 2022-04-14 ENCOUNTER — Other Ambulatory Visit: Payer: BC Managed Care – PPO

## 2022-04-14 DIAGNOSIS — E291 Testicular hypofunction: Secondary | ICD-10-CM

## 2022-04-15 LAB — TESTOSTERONE: Testosterone: 521 ng/dL (ref 264–916)

## 2022-04-15 LAB — HEMATOCRIT: Hematocrit: 45.9 % (ref 37.5–51.0)

## 2022-04-17 ENCOUNTER — Encounter: Payer: Self-pay | Admitting: *Deleted

## 2022-07-13 ENCOUNTER — Encounter: Payer: Self-pay | Admitting: Family Medicine

## 2022-07-18 ENCOUNTER — Telehealth (INDEPENDENT_AMBULATORY_CARE_PROVIDER_SITE_OTHER): Payer: PRIVATE HEALTH INSURANCE | Admitting: Psychiatry

## 2022-07-18 ENCOUNTER — Encounter: Payer: Self-pay | Admitting: Psychiatry

## 2022-07-18 DIAGNOSIS — F411 Generalized anxiety disorder: Secondary | ICD-10-CM

## 2022-07-18 DIAGNOSIS — F5101 Primary insomnia: Secondary | ICD-10-CM | POA: Diagnosis not present

## 2022-07-18 MED ORDER — VENLAFAXINE HCL ER 37.5 MG PO CP24: 37.5000 mg | ORAL_CAPSULE | Freq: Every day | ORAL | 0 refills | Status: DC

## 2022-07-18 MED ORDER — VENLAFAXINE HCL ER 75 MG PO CP24: 75.0000 mg | ORAL_CAPSULE | Freq: Every day | ORAL | 0 refills | Status: DC

## 2022-07-18 NOTE — Progress Notes (Unsigned)
Virtual Visit via Video Note  I connected with Robert [name] on 07/18/22 at  4:00 PM EST by a video enabled telemedicine application and verified that I am speaking with the correct person using two identifiers.  Location Provider Location : [facility] Patient Location : Home  Participants: Patient , Provider    I discussed the limitations of evaluation and management by telemedicine and the availability of in person appointments. The patient expressed understanding and agreed to proceed.   I discussed the assessment and treatment plan with the patient. The patient was provided an opportunity to ask questions and all were answered. The patient agreed with the plan and demonstrated an understanding of the instructions.   The patient was advised to call back or seek an in-person evaluation if the symptoms worsen or if the condition fails to improve as anticipated.    [facility] MD OP Progress Note  07/19/2022 1:02 PM Robert [name]  MRN:  [id number]  Chief Complaint:  Chief Complaint  Patient presents with   Follow-up   Medication Refill   Anxiety   Depression   HPI: Robert [name] is a 42 year old Caucasian male who lives in [place], employed, has a history of GAD, insomnia, Tourette's disorder, gastroesophageal reflux disease was evaluated by telemedicine today.  Patient today reports he is currently doing fairly well.  Reports work is busy.  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  Patient reports overall anxiety symptoms are fairly well managed on the current dosage of venlafaxine.  Currently does not have any significant side effects including sexual side effects from the lower dosage.  Patient denies any sleep problems.  Reports he has to work on sleep hygiene techniques since there are nights when he stays up late doing other things.  However overall he gets around 7 hours of sleep.  Does have them.  Available which she takes only as needed,  rarely uses it.  Patient denies any suicidality, homicidality or perceptual disturbances.  Patient denies any other concerns today.  Visit Diagnosis:    ICD-10-CM   1. GAD (generalized anxiety disorder)  F41.1 venlafaxine XR (EFFEXOR-XR) 75 MG 24 hr capsule    venlafaxine XR (EFFEXOR-XR) 37.5 MG 24 hr capsule    2. Primary insomnia  F51.01       Past Psychiatric History: Reviewed past psychiatric history from progress note on 09/29/2020.  Past trials of Lexapro, Wellbutrin, BuSpar, venlafaxine.  Past Medical History:  Past Medical History:  Diagnosis Date   ADHD    Anxiety    GERD (gastroesophageal reflux disease)    Wears contact lenses     Past Surgical History:  Procedure Laterality Date   ESOPHAGOGASTRODUODENOSCOPY (EGD) WITH PROPOFOL N/A 06/14/2017   Procedure: ESOPHAGOGASTRODUODENOSCOPY (EGD) WITH PROPOFOL;  Surgeon: Lucilla Lame, MD;  Location: [facility];  Service: Endoscopy;  Laterality: N/A;   SEPTOPLASTY  06/13/2011   WISDOM TOOTH EXTRACTION      Family Psychiatric History: Reviewed family psychiatric history from progress note on 09/29/2020.  Family History:  Family History  Problem Relation Age of Onset   Depression Mother    Suicidality Mother    Cancer Maternal Grandfather 21       stomach   Esophageal cancer Maternal Grandfather 59   Depression Sister    Colon cancer Neg Hx    Prostate cancer Neg Hx    Diabetes Neg Hx    Hypertension Neg Hx    Hypercholesterolemia Neg Hx  Social History: Reviewed social history from progress note on 09/29/2020. Social History   Socioeconomic History   Marital status: Divorced    Spouse name: Not on file   Number of children: 1   Years of education: Vocational   Highest education level: Not on file  Occupational History   Occupation: IT    Comment: Commuting to [employer]  Tobacco Use   Smoking status: Former    Packs/day: 1.00    Years: 15.00    Total pack years: 15.00    Types: Cigarettes     Quit date: 10/10/2016    Years since quitting: 5.7   Smokeless tobacco: Former   Tobacco comments:    Quit using vaping  Vaping Use   Vaping Use: Former   Quit date: 04/14/2018  Substance and Sexual Activity   Alcohol use: No   Drug use: No   Sexual activity: Yes    Birth control/protection: Surgical    Comment: Vasectomy 2013  Other Topics Concern   Not on file  Social History Narrative   Not on file   Social Determinants of Health   Financial Resource Strain: Not on file  Food Insecurity: Not on file  Transportation Needs: Not on file  Physical Activity: Not on file  Stress: Not on file  Social Connections: Not on file    Allergies: No Known Allergies  Metabolic Disorder Labs: Lab Results  Component Value Date   HGBA1C 5.8 (A) 11/08/2021   MPG 123 07/01/2021   MPG 120 09/22/2020   Lab Results  Component Value Date   PROLACTIN 8.9 05/16/2018   Lab Results  Component Value Date   CHOL 185 07/01/2021   TRIG 236 (H) 07/01/2021   HDL 38 (L) 07/01/2021   CHOLHDL 4.9 07/01/2021   LDLCALC 111 (H) 07/01/2021   LDLCALC 105 (H) 09/22/2020   Lab Results  Component Value Date   TSH 1.91 07/01/2021   TSH 1.85 09/22/2020    Therapeutic Level Labs: No results found for: "LITHIUM" No results found for: "VALPROATE" No results found for: "CBMZ"  Current Medications: Current Outpatient Medications  Medication Sig Dispense Refill   chlorhexidine (PERIDEX) 0.12 % solution SMARTSIG:By Mouth     metoprolol succinate (TOPROL-XL) 25 MG 24 hr tablet Take 1 tablet (25 mg total) by mouth daily. 90 tablet 3   NEEDLE, DISP, 18 G (BD DISP NEEDLES) 18G X 1-1/2" MISC Use 18G to draw up Testosterone medication 50 each 0   NEEDLE, DISP, 21 G (BD ECLIPSE NEEDLE) 21G X 1-1/2" MISC Use 21G needle to inject Testosterone medication. 50 each 0   Syringe, Disposable, (2-3CC SYRINGE) 3 ML MISC Use as directed 100 each 0   testosterone cypionate (DEPOTESTOSTERONE CYPIONATE) 200 MG/ML  injection Inject 1 mL (200 mg total) into the muscle every 14 (fourteen) days. 6 mL 0   zolpidem (AMBIEN) 5 MG tablet Take 0.5-1 tablets (2.5-5 mg total) by mouth at bedtime as needed for sleep. 10 tablet 0   PILOT COVID-19 AT-HOME TEST KIT  (Patient not taking: Reported on 04/12/2022)     venlafaxine XR (EFFEXOR-XR) 37.5 MG 24 hr capsule Take 1 capsule (37.5 mg total) by mouth daily with breakfast. Take along with 75 mg daily 90 capsule 0   venlafaxine XR (EFFEXOR-XR) 75 MG 24 hr capsule Take 1 capsule (75 mg total) by mouth daily with breakfast. Take along with 37.5 mg daily 90 capsule 0   No current facility-administered medications for this visit.     Musculoskeletal:  Strength & Muscle Tone:  UTA Gait & Station:  Seated Patient leans: N/A  Psychiatric Specialty Exam: Review of Systems  Psychiatric/Behavioral: Negative.    All other systems reviewed and are negative.   There were no vitals taken for this visit.There is no height or weight on file to calculate BMI.  General Appearance: Casual  Eye Contact:  Fair  Speech:  Clear and Coherent  Volume:  Normal  Mood:  Euthymic  Affect:  Appropriate  Thought Process:  Goal Directed and Descriptions of Associations: Intact  Orientation:  Full (Time, Place, and Person)  Thought Content: Logical   Suicidal Thoughts:  No  Homicidal Thoughts:  No  Memory:  Immediate;   Fair Recent;   Fair Remote;   Fair  Judgement:  Fair  Insight:  Fair  Psychomotor Activity:  Normal  Concentration:  Concentration: Fair and Attention Span: Fair  Recall:  [employer] of Knowledge: Fair  Language: Fair  Akathisia:  No  Handed:  Right  AIMS (if indicated): not done  Assets:  Communication Skills Desire for Improvement Housing Social Support  ADL's:  Intact  Cognition: WNL  Sleep:  Fair   Screenings: AIMS    Flowsheet Row Video Visit from 11/23/2021 in [facility] [place] Total Score 0      [place] Office Visit from 04/12/2022 in [facility] Video Visit from 02/17/2022 in [facility] Video Visit from 12/20/2021 in [facility] Office Visit from 11/08/2021 in [facility] Video Visit from 08/31/2021 in [facility]  Total GAD-7 Score 6 4 5 8 4       PHQ2-9    [place] Office Visit from 04/12/2022 in [place] Video Visit from 02/17/2022 in [facility] Video Visit from 12/20/2021 in [facility] Office Visit from 11/08/2021 in [facility] Video Visit from 08/31/2021 in [facility]  PHQ-2 Total Score 1 0 1 4 0  PHQ-9 Total Score 4 -- -- 11 --      Flowsheet Row Video Visit from 07/18/2022 in [place] Office Visit from 04/12/2022 in [place] Video Visit from 02/17/2022 in [place] No Risk No Risk No Risk        Assessment and Plan: Mckenna Boruff is a 42 year old Caucasian male who has a history of GAD was evaluated by telemedicine today.  Patient is currently stable.  Plan is to noted below.  Plan GAD-stable Venlafaxine extended release 112.5 mg p.o. daily, dose reduced due to the side effects.  Insomnia-stable Ambien 2.5-5 mg p.o. nightly as needed Continue CPAP Continue sleep hygiene techniques  Follow-up in clinic in 3 months or sooner if needed.   Consent: Patient/Guardian gives verbal consent for treatment and assignment of benefits for services provided during this visit. Patient/Guardian expressed understanding and agreed to proceed.   This  note was generated in part or whole with voice recognition software. Voice recognition is usually quite accurate but there are transcription errors that can and very often do occur. I apologize for any typographical errors that were not detected and corrected.      Ursula Alert, MD 07/19/2022, 1:02 PM

## 2022-08-01 DIAGNOSIS — G4733 Obstructive sleep apnea (adult) (pediatric): Secondary | ICD-10-CM | POA: Diagnosis not present

## 2022-08-04 ENCOUNTER — Encounter: Payer: Self-pay | Admitting: Family Medicine

## 2022-08-04 ENCOUNTER — Ambulatory Visit (INDEPENDENT_AMBULATORY_CARE_PROVIDER_SITE_OTHER): Payer: BC Managed Care – PPO | Admitting: Family Medicine

## 2022-08-04 VITALS — BP 134/80 | HR 95 | Ht 66.0 in | Wt 233.0 lb

## 2022-08-04 DIAGNOSIS — Z113 Encounter for screening for infections with a predominantly sexual mode of transmission: Secondary | ICD-10-CM

## 2022-08-04 DIAGNOSIS — Z7251 High risk heterosexual behavior: Secondary | ICD-10-CM | POA: Diagnosis not present

## 2022-08-04 DIAGNOSIS — F419 Anxiety disorder, unspecified: Secondary | ICD-10-CM

## 2022-08-04 DIAGNOSIS — R7303 Prediabetes: Secondary | ICD-10-CM

## 2022-08-04 DIAGNOSIS — G4733 Obstructive sleep apnea (adult) (pediatric): Secondary | ICD-10-CM

## 2022-08-04 DIAGNOSIS — Z125 Encounter for screening for malignant neoplasm of prostate: Secondary | ICD-10-CM

## 2022-08-04 DIAGNOSIS — E781 Pure hyperglyceridemia: Secondary | ICD-10-CM

## 2022-08-04 DIAGNOSIS — Z0000 Encounter for general adult medical examination without abnormal findings: Secondary | ICD-10-CM | POA: Diagnosis not present

## 2022-08-04 NOTE — Patient Instructions (Addendum)
Thank you for coming to the office today.  Stay tuned for lab results. Check next week as scheduled.  Including urine test added in as well. Make sure to get this.   ---------- Call insurance find cost and coverage of the following - check the following: - Drug Tier, Preferred List, On Formulary - All will require a "Prior Authorization" from [place] first, before you can find out the cost - Find out if there is "Step Therapy" (other medicines required before you can try these)  Once you pick the one you want to try, let me know - we can get a sample ready IF we have it in stock. Then try it - and before running out of medicine, contact me back to order your Rx so we have time to get it processed.  For Weight Loss / Obesity only  Wegovy (same as Ozempic) weekly injection - start 0.'25mg'$  weekly, 1 dose per pen, single use, auto-injector  2. Saxenda - DAILY injection - start 0.'6mg'$  injection DAILY, you can increase the dose by 1 notch or 0.6 mg per week, if you don't tolerate a dose, can reduce it the next day.  3. Zepbound - (Mounjaro)  MOST IMPORTANT PART - make sure this is discussed with them - check on INSURANCE "Weight Loss Coverage" may need to upgrade    DUE for FASTING BLOOD WORK (no food or drink after midnight before the lab appointment, only water or coffee without cream/sugar on the morning of)  SCHEDULE "Lab Only" visit in the morning at the clinic for lab draw in next week Weds 2/28 at 815am  - Make sure Lab Only appointment is at about 1 week before your next appointment, so that results will be available  For Lab Results, once available within 2-3 days of blood draw, you can can log in to MyChart online to view your results and a brief explanation. Also, we can discuss results at next follow-up visit.   Please schedule a Follow-up Appointment to: Return for 2/28 at 815am fasting lab only - then 6 month PreDM A1c Weight BP.  If you have any other questions or concerns,  please feel free to call the office or send a message through [facility]. You may also schedule an earlier appointment if necessary.  Additionally, you may be receiving a survey about your experience at our office within a few days to 1 week by e-mail or mail. We value your feedback.  Nobie Putnam, DO [facility]

## 2022-08-04 NOTE — Assessment & Plan Note (Signed)
Due for A1c Counseling on lifestyle modification wt management

## 2022-08-04 NOTE — Assessment & Plan Note (Signed)
Well controlled, chronic OSA on CPAP, now - Good adherence to CPAP nightly - Continue current CPAP therapy, patient seems to be benefiting from therapy

## 2022-08-04 NOTE — Progress Notes (Signed)
Subjective:    Patient ID: Robert [name], male    DOB: [birth year], 42 y.o.   MRN: [id number]  Robert [name] is a 42 y.o. male presenting on 08/04/2022 for Annual Exam   HPI  Annual Physical and lab orders.  Obesity BMI >37 Pre-Diabetes Hyperlipidemia   A1c 5.8 to 5.9 range Due for lab Weight loss then gained  Off Testosterone Goal to keep BP low and manage Could not break 200 lbs Concer if SSRI is raising weight He has reduced sugar but now still eating carbs.   Off Testosterone   Generalized Anxiety Disorder Insomnia Followed by Dr Ferdie Ping Psychiatry. Previously w/ Dr Nicolasa Ducking On Venlafaxine 112.'5mg'$  daily (37.5 + 75) Has tapered off Escitalopram. Now on new rx Venlafaxine '75mg'$  XR daily On Metoprolol for HR anxiety   OSA, on CPAP - Patient reports prior history of dx OSA and on CPAP for years, prior to treatment initial symptoms were snoring, daytime sleepiness and fatigue, has had several sleep studies in the past. - Today reports that sleep apnea is well controlled. He uses the CPAP machine every night. Tolerates the machine well, and thinks that sleeps better with it and feels good. No new concerns or symptoms.  Has new CPAP machine and supplies  \    08/04/2022    3:17 PM 04/12/2022    3:16 PM 04/12/2022    3:07 PM  Depression screen PHQ 2/9  Decreased Interest 1    Down, Depressed, Hopeless 1    PHQ - 2 Score 2    Altered sleeping 1    Tired, decreased energy 1    Change in appetite 1    Feeling bad or failure about yourself  0    Trouble concentrating 0    Moving slowly or fidgety/restless 0    Suicidal thoughts 0    PHQ-9 Score 5    Difficult doing work/chores Somewhat difficult       Information is confidential and restricted. Go to Review Flowsheets to unlock data.      08/04/2022    3:17 PM 04/12/2022    3:18 PM 02/17/2022   11:53 AM 12/20/2021    2:48 PM  GAD 7 : Generalized Anxiety Score  Nervous, Anxious, on Edge 1     Control/stop  worrying 0     Worry too much - different things 2     Trouble relaxing 1     Restless 0     Easily annoyed or irritable 1     Afraid - awful might happen 1     Total GAD 7 Score 6     Anxiety Difficulty Somewhat difficult        Information is confidential and restricted. Go to Review Flowsheets to unlock data.      Past Medical History:  Diagnosis Date   ADHD    Anxiety    GERD (gastroesophageal reflux disease)    Wears contact lenses    Past Surgical History:  Procedure Laterality Date   ESOPHAGOGASTRODUODENOSCOPY (EGD) WITH PROPOFOL N/A 06/14/2017   Procedure: ESOPHAGOGASTRODUODENOSCOPY (EGD) WITH PROPOFOL;  Surgeon: Lucilla Lame, MD;  Location: [facility];  Service: Endoscopy;  Laterality: N/A;   SEPTOPLASTY  06/13/2011   WISDOM TOOTH EXTRACTION     Social History   Socioeconomic History   Marital status: Divorced    Spouse name: Not on file   Number of children: 1   Years of education: Vocational   Highest education level: Not on file  Occupational History   Occupation: IT    Comment: Commuting to [employer]  Tobacco Use   Smoking status: Former    Packs/day: 1.00    Years: 15.00    Total pack years: 15.00    Types: Cigarettes    Quit date: 10/10/2016    Years since quitting: 5.8   Smokeless tobacco: Former   Tobacco comments:    Quit using vaping  Vaping Use   Vaping Use: Former   Quit date: 04/14/2018  Substance and Sexual Activity   Alcohol use: No   Drug use: No   Sexual activity: Yes    Birth control/protection: Surgical    Comment: Vasectomy 2013  Other Topics Concern   Not on file  Social History Narrative   Not on file   Social Determinants of Health   Financial Resource Strain: Not on file  Food Insecurity: Not on file  Transportation Needs: Not on file  Physical Activity: Not on file  Stress: Not on file  Social Connections: Not on file  Intimate Partner Violence: Not on file   Family History  Problem Relation Age of  Onset   Depression Mother    Suicidality Mother    Cancer Maternal Grandfather 42       stomach   Esophageal cancer Maternal Grandfather 70   Depression Sister    Colon cancer Neg Hx    Prostate cancer Neg Hx    Diabetes Neg Hx    Hypertension Neg Hx    Hypercholesterolemia Neg Hx    Current Outpatient Medications on File Prior to Visit  Medication Sig   chlorhexidine (PERIDEX) 0.12 % solution SMARTSIG:By Mouth   metoprolol succinate (TOPROL-XL) 25 MG 24 hr tablet Take 1 tablet (25 mg total) by mouth daily.   NEEDLE, DISP, 18 G (BD DISP NEEDLES) 18G X 1-1/2" MISC Use 18G to draw up Testosterone medication   NEEDLE, DISP, 21 G (BD ECLIPSE NEEDLE) 21G X 1-1/2" MISC Use 21G needle to inject Testosterone medication.   Syringe, Disposable, (2-3CC SYRINGE) 3 ML MISC Use as directed   venlafaxine XR (EFFEXOR-XR) 37.5 MG 24 hr capsule Take 1 capsule (37.5 mg total) by mouth daily with breakfast. Take along with 75 mg daily   venlafaxine XR (EFFEXOR-XR) 75 MG 24 hr capsule Take 1 capsule (75 mg total) by mouth daily with breakfast. Take along with 37.5 mg daily   zolpidem (AMBIEN) 5 MG tablet Take 0.5-1 tablets (2.5-5 mg total) by mouth at bedtime as needed for sleep.   No current facility-administered medications on file prior to visit.    Review of Systems  Constitutional:  Negative for activity change, appetite change, chills, diaphoresis, fatigue and fever.  HENT:  Negative for congestion and hearing loss.   Eyes:  Negative for visual disturbance.  Respiratory:  Negative for cough, chest tightness, shortness of breath and wheezing.   Cardiovascular:  Negative for chest pain, palpitations and leg swelling.  Gastrointestinal:  Negative for abdominal pain, constipation, diarrhea, nausea and vomiting.  Genitourinary:  Negative for dysuria, frequency and hematuria.  Musculoskeletal:  Negative for arthralgias and neck pain.  Skin:  Negative for rash.  Neurological:  Negative for dizziness,  weakness, light-headedness, numbness and headaches.  Hematological:  Negative for adenopathy.  Psychiatric/Behavioral:  Negative for behavioral problems, dysphoric mood and sleep disturbance.    Per HPI unless specifically indicated above      Objective:    BP 134/80   Pulse 95   Ht '5\' 6"'$  (1.676 m)  Wt 233 lb (105.7 kg)   SpO2 94%   BMI 37.61 kg/m   Wt Readings from Last 3 Encounters:  08/04/22 233 lb (105.7 kg)  11/08/21 213 lb 9.6 oz (96.9 kg)  09/12/21 223 lb (101.2 kg)    Physical Exam Vitals and nursing note reviewed.  Constitutional:      General: He is not in acute distress.    Appearance: He is well-developed. He is not diaphoretic.     Comments: Well-appearing, comfortable, cooperative  HENT:     Head: Normocephalic and atraumatic.  Eyes:     General:        Right eye: No discharge.        Left eye: No discharge.     Conjunctiva/sclera: Conjunctivae normal.     Pupils: Pupils are equal, round, and reactive to light.  Neck:     Thyroid: No thyromegaly.  Cardiovascular:     Rate and Rhythm: Normal rate and regular rhythm.     Pulses: Normal pulses.     Heart sounds: Normal heart sounds. No murmur heard. Pulmonary:     Effort: Pulmonary effort is normal. No respiratory distress.     Breath sounds: Normal breath sounds. No wheezing or rales.  Abdominal:     General: Bowel sounds are normal. There is no distension.     Palpations: Abdomen is soft. There is no mass.     Tenderness: There is no abdominal tenderness.  Musculoskeletal:        General: No tenderness. Normal range of motion.     Cervical back: Normal range of motion and neck supple.     Comments: Upper / Lower Extremities: - Normal muscle tone, strength bilateral upper extremities 5/5, lower extremities 5/5  Lymphadenopathy:     Cervical: No cervical adenopathy.  Skin:    General: Skin is warm and dry.     Findings: No erythema or rash.  Neurological:     Mental Status: He is alert and  oriented to person, place, and time.     Comments: Distal sensation intact to light touch all extremities  Psychiatric:        Mood and Affect: Mood normal.        Behavior: Behavior normal.        Thought Content: Thought content normal.     Comments: Well groomed, good eye contact, normal speech and thoughts    Results for orders placed or performed in visit on 04/14/22  Hematocrit  Result Value Ref Range   Hematocrit 45.9 37.5 - 51.0 %  Testosterone  Result Value Ref Range   Testosterone 521 264 - 916 ng/dL      Assessment & Plan:   Problem List Items Addressed This Visit     Anxiety   Morbid obesity ([place])   Relevant Orders   COMPLETE METABOLIC PANEL WITH GFR   Lipid panel   TSH   OSA (obstructive sleep apnea)    Well controlled, chronic OSA on CPAP, now - Good adherence to CPAP nightly - Continue current CPAP therapy, patient seems to be benefiting from therapy       Pre-diabetes    Due for A1c Counseling on lifestyle modification wt management      Relevant Orders   Hemoglobin A1c   Other Visit Diagnoses     Annual physical exam    -  Primary   Relevant Orders   COMPLETE METABOLIC PANEL WITH GFR   CBC with Differential/Platelet   Lipid panel  Hemoglobin A1c   PSA   TSH   High risk heterosexual behavior       Relevant Orders   HIV Antibody (routine testing w rflx)   RPR   GC/Chlamydia probe amp ([place])not at [facility]   Screening for STD (sexually transmitted disease)       Relevant Orders   HIV Antibody (routine testing w rflx)   RPR   GC/Chlamydia probe amp ([place])not at [facility]   Hypertriglyceridemia       Relevant Orders   COMPLETE METABOLIC PANEL WITH GFR   Lipid panel   TSH   Screening for prostate cancer       Relevant Orders   PSA       Updated Health Maintenance information Fasting labs next week. Encouraged improvement to lifestyle with diet and exercise Goal of weight loss  Discussed medication management for weight  loss Check with insurance on wt loss coverage and GLP therapy He cannot take Contrave or Phentermine due to contraindications,   No orders of the defined types were placed in this encounter.   Follow up plan: Return for 2/28 at 815am fasting lab only - then 6 month PreDM A1c Weight BP.  Future labs CMET Lipid CBC A1c, TSH HIV RPR, PSA and urine Chlamydia / Gonorrhea  Nobie Putnam, DO [facility] 08/04/2022, 3:29 PM

## 2022-08-08 ENCOUNTER — Other Ambulatory Visit (HOSPITAL_COMMUNITY)
Admission: RE | Admit: 2022-08-08 | Discharge: 2022-08-08 | Disposition: A | Discharge status: Home / Self Care | Payer: BC Managed Care – PPO | Source: Ambulatory Visit | Attending: Family Medicine | Admitting: Family Medicine

## 2022-08-08 ENCOUNTER — Other Ambulatory Visit: Payer: Self-pay

## 2022-08-08 DIAGNOSIS — R7303 Prediabetes: Secondary | ICD-10-CM

## 2022-08-08 DIAGNOSIS — Z0000 Encounter for general adult medical examination without abnormal findings: Secondary | ICD-10-CM

## 2022-08-08 DIAGNOSIS — Z7251 High risk heterosexual behavior: Secondary | ICD-10-CM | POA: Diagnosis not present

## 2022-08-08 DIAGNOSIS — Z113 Encounter for screening for infections with a predominantly sexual mode of transmission: Secondary | ICD-10-CM | POA: Insufficient documentation

## 2022-08-08 DIAGNOSIS — Z125 Encounter for screening for malignant neoplasm of prostate: Secondary | ICD-10-CM

## 2022-08-08 DIAGNOSIS — E781 Pure hyperglyceridemia: Secondary | ICD-10-CM

## 2022-08-09 ENCOUNTER — Other Ambulatory Visit: Payer: BC Managed Care – PPO

## 2022-08-09 ENCOUNTER — Encounter: Payer: Self-pay | Admitting: Family Medicine

## 2022-08-09 DIAGNOSIS — E781 Pure hyperglyceridemia: Secondary | ICD-10-CM | POA: Diagnosis not present

## 2022-08-09 DIAGNOSIS — R7303 Prediabetes: Secondary | ICD-10-CM | POA: Diagnosis not present

## 2022-08-09 DIAGNOSIS — Z125 Encounter for screening for malignant neoplasm of prostate: Secondary | ICD-10-CM | POA: Diagnosis not present

## 2022-08-09 DIAGNOSIS — Z0000 Encounter for general adult medical examination without abnormal findings: Secondary | ICD-10-CM | POA: Diagnosis not present

## 2022-08-10 LAB — COMPLETE METABOLIC PANEL WITH GFR
AG Ratio: 1.8 (calc) (ref 1.0–2.5)
ALT: 36 U/L (ref 9–46)
AST: 20 U/L (ref 10–40)
Albumin: 4.9 g/dL (ref 3.6–5.1)
Alkaline phosphatase (APISO): 50 U/L (ref 36–130)
BUN: 19 mg/dL (ref 7–25)
CO2: 28 mmol/L (ref 20–32)
Calcium: 10 mg/dL (ref 8.6–10.3)
Chloride: 104 mmol/L (ref 98–110)
Creat: 1.24 mg/dL (ref 0.60–1.29)
Globulin: 2.8 g/dL (calc) (ref 1.9–3.7)
Glucose, Bld: 87 mg/dL (ref 65–99)
Potassium: 4.4 mmol/L (ref 3.5–5.3)
Sodium: 141 mmol/L (ref 135–146)
Total Bilirubin: 0.6 mg/dL (ref 0.2–1.2)
Total Protein: 7.7 g/dL (ref 6.1–8.1)
eGFR: 75 mL/min/{1.73_m2} (ref >=60)

## 2022-08-10 LAB — CBC WITH DIFFERENTIAL/PLATELET
Absolute Monocytes: 735 cells/uL (ref 200–950)
Basophils Absolute: 68 cells/uL (ref 0–200)
Basophils Relative: 0.9 %
Eosinophils Absolute: 563 cells/uL — ABNORMAL HIGH (ref 15–500)
Eosinophils Relative: 7.5 %
HCT: 51.3 % — ABNORMAL HIGH (ref 38.5–50.0)
Hemoglobin: 17.3 g/dL — ABNORMAL HIGH (ref 13.2–17.1)
Lymphs Abs: 2243 cells/uL (ref 850–3900)
MCH: 29 pg (ref 27.0–33.0)
MCHC: 33.7 g/dL (ref 32.0–36.0)
MCV: 85.9 fL (ref 80.0–100.0)
MPV: 10 fL (ref 7.5–12.5)
Monocytes Relative: 9.8 %
Neutro Abs: 3893 cells/uL (ref 1500–7800)
Neutrophils Relative %: 51.9 %
Platelets: 252 10*3/uL (ref 140–400)
RBC: 5.97 10*6/uL — ABNORMAL HIGH (ref 4.20–5.80)
RDW: 13.9 % (ref 11.0–15.0)
Total Lymphocyte: 29.9 %
WBC: 7.5 10*3/uL (ref 3.8–10.8)

## 2022-08-10 LAB — HEMOGLOBIN A1C
Hgb A1c MFr Bld: 6.1 % of total Hgb — ABNORMAL HIGH (ref <5.7)
Mean Plasma Glucose: 128 mg/dL
eAG (mmol/L): 7.1 mmol/L

## 2022-08-10 LAB — LIPID PANEL
Cholesterol: 170 mg/dL (ref <200)
HDL: 48 mg/dL (ref >=40)
LDL Cholesterol (Calc): 93 mg/dL (calc)
Non-HDL Cholesterol (Calc): 122 mg/dL (calc) (ref <130)
Total CHOL/HDL Ratio: 3.5 (calc) (ref <5.0)
Triglycerides: 203 mg/dL — ABNORMAL HIGH (ref <150)

## 2022-08-10 LAB — PSA: PSA: 0.81 ng/mL (ref <=4.00)

## 2022-08-10 LAB — TSH: TSH: 1.26 mIU/L (ref 0.40–4.50)

## 2022-08-10 LAB — HIV ANTIBODY (ROUTINE TESTING W REFLEX): HIV 1&2 Ab, 4th Generation: NONREACTIVE

## 2022-08-10 LAB — RPR: RPR Ser Ql: NONREACTIVE

## 2022-08-14 LAB — GC/CHLAMYDIA PROBE AMP (~~LOC~~) NOT AT ARMC
Chlamydia: NEGATIVE
Comment: NEGATIVE
Comment: NORMAL
Neisseria Gonorrhea: NEGATIVE

## 2022-08-30 DIAGNOSIS — G4733 Obstructive sleep apnea (adult) (pediatric): Secondary | ICD-10-CM | POA: Diagnosis not present

## 2022-09-13 ENCOUNTER — Other Ambulatory Visit: Payer: Self-pay

## 2022-09-13 DIAGNOSIS — E291 Testicular hypofunction: Secondary | ICD-10-CM

## 2022-09-14 ENCOUNTER — Other Ambulatory Visit: Payer: BC Managed Care – PPO

## 2022-09-14 DIAGNOSIS — E291 Testicular hypofunction: Secondary | ICD-10-CM

## 2022-09-15 ENCOUNTER — Encounter: Payer: Self-pay | Admitting: Urology

## 2022-09-15 ENCOUNTER — Ambulatory Visit: Payer: BC Managed Care – PPO | Admitting: Urology

## 2022-09-15 VITALS — BP 143/85 | HR 92 | Ht 66.0 in | Wt 220.0 lb

## 2022-09-15 DIAGNOSIS — E291 Testicular hypofunction: Secondary | ICD-10-CM | POA: Diagnosis not present

## 2022-09-15 LAB — TESTOSTERONE: Testosterone: 241 ng/dL — ABNORMAL LOW (ref 264–916)

## 2022-09-15 LAB — HEMATOCRIT: Hematocrit: 45 % (ref 37.5–51.0)

## 2022-09-15 LAB — PSA: Prostate Specific Ag, Serum: 0.7 ng/mL (ref 0.0–4.0)

## 2022-09-15 NOTE — Progress Notes (Signed)
I, DeAsia L Maxie,acting as a scribe for Robert AltesScott C Kemonte Ullman, MD.,have documented all relevant documentation on the behalf of Robert AltesScott C Tiannah Greenly, MD,as directed by  Robert AltesScott C Misti Towle, MD while in the presence of Robert AltesScott C Emmarose Klinke, MD.   09/15/22 5:18 PM   Robert [name] Mar 13, 1981 [id number]  Referring provider: Smitty CordsKaramalegos, Robert [name], Robert [name] [address] [place] [place],  [place] [id number]  Chief Complaint  Patient presents with   Hypogonadism   Follow-up   Urologic history: 1. Hypogonadism -Trial Clomid 12/19; improved T but not symptoms -TRT started 07/2018 -Symptoms tiredness, fatigue, decreased libido -Testosterone cypionate 200 mg every 2 weeks  HPI: 42 y.o. male presents for annual follow-up.  He reports discontinuing testosterone injections approximately 3-4 months ago due to concerns about erythrocytosis and elevated blood pressure He notes experiencing symptoms of low testosterone and expresses interest in exploring alternative treatments. Labs 09/14/22: testosterone 241; PSA 0.7; hematocrit 45.0   PMH: Past Medical History:  Diagnosis Date   ADHD    Anxiety    GERD (gastroesophageal reflux disease)    Wears contact lenses     Surgical History: Past Surgical History:  Procedure Laterality Date   ESOPHAGOGASTRODUODENOSCOPY (EGD) WITH PROPOFOL N/A 06/14/2017   Procedure: ESOPHAGOGASTRODUODENOSCOPY (EGD) WITH PROPOFOL;  Surgeon: Midge MiniumWohl, Darren, MD;  [facility];  Service: Endoscopy;  Laterality: N/A;   SEPTOPLASTY  06/13/2011   WISDOM TOOTH EXTRACTION      Home Medications:  Allergies as of 09/15/2022   No Known Allergies      Medication List        Accurate as of September 15, 2022 11:59 PM. If you have any questions, ask your nurse or doctor.          2-3CC SYRINGE 3 ML Misc Use as directed   chlorhexidine 0.12 % solution Commonly known as: PERIDEX SMARTSIG:By Mouth   metoprolol succinate 25 MG 24 hr tablet Commonly known as: TOPROL-XL Take 1 tablet (25 mg  total) by mouth daily.   NEEDLE (DISP) 18 G 18G X 1-1/2" Misc Commonly known as: BD Disp Needles Use 18G to draw up Testosterone medication   NEEDLE (DISP) 21 G 21G X 1-1/2" Misc Commonly known as: BD Eclipse Needle Use 21G needle to inject Testosterone medication.   venlafaxine XR 75 MG 24 hr capsule Commonly known as: EFFEXOR-XR Take 1 capsule (75 mg total) by mouth daily with breakfast. Take along with 37.5 mg daily   venlafaxine XR 37.5 MG 24 hr capsule Commonly known as: EFFEXOR-XR Take 1 capsule (37.5 mg total) by mouth daily with breakfast. Take along with 75 mg daily   zolpidem 5 MG tablet Commonly known as: AMBIEN Take 0.5-1 tablets (2.5-5 mg total) by mouth at bedtime as needed for sleep.        Allergies: No Known Allergies  Family History: Family History  Problem Relation Age of Onset   Depression Mother    Suicidality Mother    Cancer Maternal Grandfather [phone]       stomach   Esophageal cancer Maternal Grandfather [phone]   Depression Sister    Colon cancer Neg Hx    Prostate cancer Neg Hx    Diabetes Neg Hx    Hypertension Neg Hx    Hypercholesterolemia Neg Hx     Social History:  reports that he quit smoking about 5 years ago. His smoking use included cigarettes. He has a 15.00 pack-year smoking history. He has quit using smokeless tobacco. He reports that he does  not drink alcohol and does not use drugs.   Physical Exam: BP (!) 143/85   Pulse 92   Ht 5\' 6"  (1.676 m)   Wt 220 lb (99.8 kg)   BMI 35.51 kg/m   Constitutional:  Alert and oriented, No acute distress. HEENT: [place] AT, moist mucus membranes.  Trachea midline, no masses. Cardiovascular: No clubbing, cyanosis, or edema. Respiratory: Normal respiratory effort, no increased work of breathing.  Assessment & Plan:    1.  Hypogonadism Low testosterone level, however he stopped 3-4 months ago due to concerns of erythrocytosis and elevated blood pressure Does note some low T symptoms and wanted to  discuss alternatives  We discussed testosterone gel does have a lower incidence of erythrocytosis. An LH was added onto yesterday's lab, if his LH is mid to low normal clomid would also be an option He will think over these options and we will notify with his LH results  I have reviewed the above documentation for accuracy and completeness, and I agree with the above.   Robert Altes, MD  [facility] [address] [place], [place] [address] [phone]

## 2022-09-16 LAB — LUTEINIZING HORMONE: LH: 7.1 m[IU]/mL (ref 1.7–8.6)

## 2022-09-16 LAB — SPECIMEN STATUS REPORT

## 2022-09-18 ENCOUNTER — Encounter: Payer: Self-pay | Admitting: *Deleted

## 2022-09-21 ENCOUNTER — Other Ambulatory Visit: Payer: Self-pay | Admitting: Urology

## 2022-09-21 MED ORDER — CLOMIPHENE CITRATE 50 MG PO TABS: 25.0000 mg | ORAL_TABLET | Freq: Every day | ORAL | 0 refills | Status: DC

## 2022-09-30 DIAGNOSIS — G4733 Obstructive sleep apnea (adult) (pediatric): Secondary | ICD-10-CM | POA: Diagnosis not present

## 2022-10-11 DIAGNOSIS — G5603 Carpal tunnel syndrome, bilateral upper limbs: Secondary | ICD-10-CM | POA: Diagnosis not present

## 2022-10-15 ENCOUNTER — Other Ambulatory Visit: Payer: Self-pay | Admitting: Psychiatry

## 2022-10-15 DIAGNOSIS — F411 Generalized anxiety disorder: Secondary | ICD-10-CM

## 2022-10-16 ENCOUNTER — Telehealth (INDEPENDENT_AMBULATORY_CARE_PROVIDER_SITE_OTHER): Payer: PRIVATE HEALTH INSURANCE | Admitting: Psychiatry

## 2022-10-16 ENCOUNTER — Encounter: Payer: Self-pay | Admitting: Psychiatry

## 2022-10-16 DIAGNOSIS — F411 Generalized anxiety disorder: Secondary | ICD-10-CM | POA: Diagnosis not present

## 2022-10-16 DIAGNOSIS — F5101 Primary insomnia: Secondary | ICD-10-CM

## 2022-10-16 MED ORDER — HYDROXYZINE PAMOATE 25 MG PO CAPS
25.0000 mg | ORAL_CAPSULE | Freq: Every evening | ORAL | 1 refills | Status: DC | PRN
Start: 2022-10-16 — End: 2023-01-02

## 2022-10-16 NOTE — Progress Notes (Unsigned)
Virtual Visit via Video Note  I connected with Robert [name] on 10/16/22 at  4:00 PM EDT by a video enabled telemedicine application and verified that I am speaking with the correct person using two identifiers.  Location Provider Location : [facility] Patient Location : Home  Participants: Patient , Provider   I discussed the limitations of evaluation and management by telemedicine and the availability of in person appointments. The patient expressed understanding and agreed to proceed.   I discussed the assessment and treatment plan with the patient. The patient was provided an opportunity to ask questions and all were answered. The patient agreed with the plan and demonstrated an understanding of the instructions.   The patient was advised to call back or seek an in-person evaluation if the symptoms worsen or if the condition fails to improve as anticipated.    BH MD OP Progress Note  10/17/2022 2:03 PM Robert [name]  MRN:  [id number]  Chief Complaint:  Chief Complaint  Patient presents with   Follow-up   Anxiety   Depression   Medication Refill   HPI: Robert [name] is a 42 year old Caucasian male, lives in [place], employed, has a history of GAD, insomnia, Tourette's disorder, gastroesophageal reflux disease was evaluated by telemedicine today.  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  Patient however reports since he has been trying to do both he is probably burned out.  Patient reports he has had some lack of motivation, low energy, tiredness, anxiety, during the day, right before starting his day job.  Patient also reports headaches on and off.  Patient did have some sleep issues however reports he is taking Benadryl now for his seasonal allergies and that seems to help with the sleep.  He would like to try hydroxyzine however could not get it over the counter.  He  has been fine on the CPAP which is working well.  That does help with his tiredness during the day.  Patient reports recently he was able to take a break for a week from his work and was able to get some projects done around the house and that really helped with his mood.  Patient is currently compliant on the venlafaxine.  Denies side effects.  Patient denies any suicidality, homicidality or perceptual disturbances.  Patient denies any other concerns today.  Visit Diagnosis:    ICD-10-CM   1. GAD (generalized anxiety disorder)  F41.1 hydrOXYzine (VISTARIL) 25 MG capsule    2. Primary insomnia  F51.01 hydrOXYzine (VISTARIL) 25 MG capsule      Past Psychiatric History: Reviewed past psychiatric history from progress note on 09/29/2020.  Past trials of Lexapro, Wellbutrin, BuSpar, venlafaxine.  Past Medical History:  Past Medical History:  Diagnosis Date   ADHD    Anxiety    GERD (gastroesophageal reflux disease)    Wears contact lenses     Past Surgical History:  Procedure Laterality Date   ESOPHAGOGASTRODUODENOSCOPY (EGD) WITH PROPOFOL N/A 06/14/2017   Procedure: ESOPHAGOGASTRODUODENOSCOPY (EGD) WITH PROPOFOL;  Surgeon: Midge Minium, MD;  Location: [facility];  Service: Endoscopy;  Laterality: N/A;   SEPTOPLASTY  06/13/2011   WISDOM TOOTH EXTRACTION      Family Psychiatric History: Reviewed family psychiatric history from progress note on 09/29/2020.  Family History:  Family History  Problem Relation Age of Onset   Depression Mother    Suicidality  Mother    Cancer Maternal Grandfather 54       stomach   Esophageal cancer Maternal Grandfather 31   Depression Sister    Colon cancer Neg Hx    Prostate cancer Neg Hx    Diabetes Neg Hx    Hypertension Neg Hx    Hypercholesterolemia Neg Hx     Social History: Reviewed social history from progress note on 09/29/2020. Social History   Socioeconomic History   Marital status: Divorced    Spouse name: Not on file    Number of children: 1   Years of education: Vocational   Highest education level: Not on file  Occupational History   Occupation: IT    Comment: Commuting to [employer]  Tobacco Use   Smoking status: Former    Packs/day: 1.00    Years: 15.00    Additional pack years: 0.00    Total pack years: 15.00    Types: Cigarettes    Quit date: 10/10/2016    Years since quitting: 6.0   Smokeless tobacco: Former   Tobacco comments:    Quit using vaping  Vaping Use   Vaping Use: Former   Quit date: 04/14/2018  Substance and Sexual Activity   Alcohol use: No   Drug use: No   Sexual activity: Yes    Birth control/protection: Surgical    Comment: Vasectomy 2013  Other Topics Concern   Not on file  Social History Narrative   Not on file   Social Determinants of Health   Financial Resource Strain: Not on file  Food Insecurity: Not on file  Transportation Needs: Not on file  Physical Activity: Not on file  Stress: Not on file  Social Connections: Not on file    Allergies: No Known Allergies  Metabolic Disorder Labs: Lab Results  Component Value Date   HGBA1C 6.1 (H) 08/09/2022   MPG 128 08/09/2022   MPG 123 07/01/2021   Lab Results  Component Value Date   PROLACTIN 8.9 05/16/2018   Lab Results  Component Value Date   CHOL 170 08/09/2022   TRIG 203 (H) 08/09/2022   HDL 48 08/09/2022   CHOLHDL 3.5 08/09/2022   LDLCALC 93 08/09/2022   LDLCALC 111 (H) 07/01/2021   Lab Results  Component Value Date   TSH 1.26 08/09/2022   TSH 1.91 07/01/2021    Therapeutic Level Labs: No results found for: "LITHIUM" No results found for: "VALPROATE" No results found for: "CBMZ"  Current Medications: Current Outpatient Medications  Medication Sig Dispense Refill   clomiPHENE (CLOMID) 50 MG tablet Take 0.5 tablets (25 mg total) by mouth daily. 30 tablet 0   hydrOXYzine (VISTARIL) 25 MG capsule Take 1-2 capsules (25-50 mg total) by mouth at bedtime as needed for anxiety (sleep). 60  capsule 1   chlorhexidine (PERIDEX) 0.12 % solution SMARTSIG:By Mouth     metoprolol succinate (TOPROL-XL) 25 MG 24 hr tablet Take 1 tablet (25 mg total) by mouth daily. 90 tablet 3   NEEDLE, DISP, 18 G (BD DISP NEEDLES) 18G X 1-1/2" MISC Use 18G to draw up Testosterone medication 50 each 0   NEEDLE, DISP, 21 G (BD ECLIPSE NEEDLE) 21G X 1-1/2" MISC Use 21G needle to inject Testosterone medication. 50 each 0   Syringe, Disposable, (2-3CC SYRINGE) 3 ML MISC Use as directed 100 each 0   venlafaxine XR (EFFEXOR-XR) 37.5 MG 24 hr capsule TAKE ONE CAPSULE BY MOUTH EVERY DAY WITH BREAKFAST ALONG WITH 75 MG 90 capsule 0  venlafaxine XR (EFFEXOR-XR) 75 MG 24 hr capsule TAKE ONE CAPSULE BY MOUTH EVERY DAY WITH BREAKFAST ALONG WITH 37.5 MG DAILY 90 capsule 0   zolpidem (AMBIEN) 5 MG tablet Take 0.5-1 tablets (2.5-5 mg total) by mouth at bedtime as needed for sleep. 10 tablet 0   No current facility-administered medications for this visit.     Musculoskeletal: Strength & Muscle Tone:  UTA Gait & Station:  Seated Patient leans: N/A  Psychiatric Specialty Exam: Review of Systems  Psychiatric/Behavioral:  The patient is nervous/anxious.   All other systems reviewed and are negative.   There were no vitals taken for this visit.There is no height or weight on file to calculate BMI.  General Appearance: Casual  Eye Contact:  Fair  Speech:  Normal Rate  Volume:  Normal  Mood:  Anxious  Affect:  Appropriate  Thought Process:  Goal Directed and Descriptions of Associations: Intact  Orientation:  Full (Time, Place, and Person)  Thought Content: Logical   Suicidal Thoughts:  No  Homicidal Thoughts:  No  Memory:  Immediate;   Fair Recent;   Fair Remote;   Fair  Judgement:  Fair  Insight:  Fair  Psychomotor Activity:  Normal  Concentration:  Concentration: Fair and Attention Span: Fair  Recall:  [employer] of Knowledge: Fair  Language: Fair  Akathisia:  No  Handed:  Right  AIMS (if  indicated): not done  Assets:  Communication Skills Desire for Improvement Housing Social Support  ADL's:  Intact  Cognition: WNL  Sleep:  Fair   Screenings: AIMS    Flowsheet Row Video Visit from 11/23/2021 in [facility]  AIMS Total Score 0      GAD-7    Flowsheet Row Video Visit from 10/16/2022 in [facility] Office Visit from 08/04/2022 in [facility] Office Visit from 04/12/2022 in [facility] Video Visit from 02/17/2022 in [facility] Video Visit from 12/20/2021 in [facility]  Total GAD-7 Score 9 6 6 4 5       PHQ2-9    Flowsheet Row Video Visit from 10/16/2022 in [facility] Office Visit from 08/04/2022 in [facility] Office Visit from 04/12/2022 in [facility] Video Visit from 02/17/2022 in [facility] Video Visit from 12/20/2021 in [facility]  PHQ-2 Total Score 1 2 1  0 1  PHQ-9 Total Score -- 5 4 -- --      Flowsheet Row Video Visit from 10/16/2022 in [facility] Video Visit from 07/18/2022 in [facility] Office Visit from 04/12/2022 in [facility]  C-SSRS RISK CATEGORY No Risk No Risk No Risk        Assessment and Plan: Robert [name] is a 42 year old Caucasian male who has a history of GAD was evaluated by telemedicine today.  Patient currently struggling with anxiety likely due to working 2 jobs, will benefit from following plan.  Plan GAD-unstable Venlafaxine extended release 112.5 mg p.o. daily.  Dose reduced due to side effects. Start  hydroxyzine 25-50 mg at bedtime as needed for anxiety and sleep  Insomnia-stable Ambien 2.5-5 mg p.o. nightly as needed Continue CPAP Hydroxyzine as noted above will also help as needed for sleep. Patient to continue  sleep hygiene techniques   Follow-up in clinic in 6 to 8 weeks or sooner if needed.    Consent: Patient/Guardian gives verbal consent for treatment and assignment of benefits for services provided during this visit. Patient/Guardian expressed understanding and agreed to proceed.   This note was generated in part or whole with voice recognition software. Voice recognition is usually quite accurate but there are transcription errors that can and very often do occur. I apologize for any typographical errors that were not detected and corrected.    Jomarie Longs, MD 10/17/2022, 2:03 PM

## 2022-10-30 DIAGNOSIS — G4733 Obstructive sleep apnea (adult) (pediatric): Secondary | ICD-10-CM | POA: Diagnosis not present

## 2022-11-08 ENCOUNTER — Encounter: Payer: Self-pay | Admitting: Family Medicine

## 2022-11-08 DIAGNOSIS — R000 Tachycardia, unspecified: Secondary | ICD-10-CM

## 2022-11-08 DIAGNOSIS — F419 Anxiety disorder, unspecified: Secondary | ICD-10-CM

## 2022-11-08 DIAGNOSIS — G5602 Carpal tunnel syndrome, left upper limb: Secondary | ICD-10-CM | POA: Diagnosis not present

## 2022-11-08 MED ORDER — METOPROLOL SUCCINATE ER 25 MG PO TB24
25.0000 mg | ORAL_TABLET | Freq: Every day | ORAL | 0 refills | Status: DC
Start: 2022-11-08 — End: 2023-02-02

## 2022-11-09 ENCOUNTER — Other Ambulatory Visit: Payer: Self-pay | Admitting: *Deleted

## 2022-11-09 ENCOUNTER — Other Ambulatory Visit: Payer: BC Managed Care – PPO

## 2022-11-09 DIAGNOSIS — E291 Testicular hypofunction: Secondary | ICD-10-CM

## 2022-11-10 ENCOUNTER — Encounter: Payer: Self-pay | Admitting: Urology

## 2022-11-10 ENCOUNTER — Other Ambulatory Visit: Payer: BC Managed Care – PPO

## 2022-11-10 DIAGNOSIS — E291 Testicular hypofunction: Secondary | ICD-10-CM | POA: Diagnosis not present

## 2022-11-11 LAB — TESTOSTERONE: Testosterone: 401 ng/dL (ref 264–916)

## 2022-11-13 ENCOUNTER — Encounter: Payer: Self-pay | Admitting: *Deleted

## 2022-11-30 DIAGNOSIS — G4733 Obstructive sleep apnea (adult) (pediatric): Secondary | ICD-10-CM | POA: Diagnosis not present

## 2022-12-08 ENCOUNTER — Other Ambulatory Visit: Payer: Self-pay | Admitting: Urology

## 2022-12-30 DIAGNOSIS — G4733 Obstructive sleep apnea (adult) (pediatric): Secondary | ICD-10-CM | POA: Diagnosis not present

## 2023-01-02 ENCOUNTER — Telehealth (INDEPENDENT_AMBULATORY_CARE_PROVIDER_SITE_OTHER): Payer: PRIVATE HEALTH INSURANCE | Admitting: Psychiatry

## 2023-01-02 ENCOUNTER — Encounter: Payer: Self-pay | Admitting: Psychiatry

## 2023-01-02 DIAGNOSIS — F411 Generalized anxiety disorder: Secondary | ICD-10-CM | POA: Diagnosis not present

## 2023-01-02 DIAGNOSIS — F5101 Primary insomnia: Secondary | ICD-10-CM | POA: Diagnosis not present

## 2023-01-02 DIAGNOSIS — F32A Depression, unspecified: Secondary | ICD-10-CM | POA: Diagnosis not present

## 2023-01-02 MED ORDER — VENLAFAXINE HCL ER 75 MG PO CP24: 75.0000 mg | ORAL_CAPSULE | Freq: Every day | ORAL | 0 refills | Status: DC

## 2023-01-02 MED ORDER — VENLAFAXINE HCL ER 37.5 MG PO CP24: 37.5000 mg | ORAL_CAPSULE | Freq: Every day | ORAL | 0 refills | Status: DC

## 2023-01-02 MED ORDER — HYDROXYZINE PAMOATE 25 MG PO CAPS
25.0000 mg | ORAL_CAPSULE | Freq: Every evening | ORAL | 1 refills | Status: DC | PRN
Start: 2023-01-02 — End: 2023-02-19

## 2023-01-02 MED ORDER — BUPROPION HCL 75 MG PO TABS
75.0000 mg | ORAL_TABLET | Freq: Every morning | ORAL | 1 refills | Status: DC
Start: 2023-01-02 — End: 2023-02-19

## 2023-01-02 NOTE — Progress Notes (Signed)
Virtual Visit via Video Note  I connected with Robert [name] on 01/02/23 at 11:30 AM EDT by a video enabled telemedicine application and verified that I am speaking with the correct person using two identifiers.  Location Provider Location : [facility] Patient Location : Home  Participants: Patient , Provider    I discussed the limitations of evaluation and management by telemedicine and the availability of in person appointments. The patient expressed understanding and agreed to proceed.   I discussed the assessment and treatment plan with the patient. The patient was provided an opportunity to ask questions and all were answered. The patient agreed with the plan and demonstrated an understanding of the instructions.   The patient was advised to call back or seek an in-person evaluation if the symptoms worsen or if the condition fails to improve as anticipated.   BH MD OP Progress Note  01/02/2023 11:56 AM Taeshawn Helfman  MRN:  [id number]  Chief Complaint:  Chief Complaint  Patient presents with   Follow-up   Anxiety   Depression   Medication Refill   HPI: Robert [name] is a 42 year old Caucasian Male , lives in [place], employed, has a history of GAD, insomnia, Tourette's disorder, GERD, was evaluated by telemedicine today.  Patient today appeared to be pleasant, cooperative.  Reports he just got back from vacation.  Patient reports the trip did not go too well due to some interpersonal conflict.  Patient however reports he overall managed to do okay.  Patient reports he does struggle with lack of motivation on a daily basis.  Has a list of things that he has to accomplish or has been unable to get to it.  Patient continues to struggle getting motivated to do his day job.  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  Patient does reports sadness,  low motivation, sleep problems when he does not take his sleep medication.  Patient reports the venlafaxine is  managing his anxiety well.  He denies any significant panic attacks.  Patient denies any suicidality, homicidality or perceptual disturbances.  Currently compliant on venlafaxine.  He does take the hydroxyzine and Ambien as needed.  Denies side effects.  Patient agreeable to trial of Wellbutrin.  Patient receptive to counseling, provided education that Wellbutrin could interact with medications like metoprolol as well as venlafaxine.  Agreeable to low-dose trial.  Patient denies any other concerns today.  Visit Diagnosis:    ICD-10-CM   1. GAD (generalized anxiety disorder)  F41.1 venlafaxine XR (EFFEXOR-XR) 37.5 MG 24 hr capsule    venlafaxine XR (EFFEXOR-XR) 75 MG 24 hr capsule    hydrOXYzine (VISTARIL) 25 MG capsule    2. Primary insomnia  F51.01 hydrOXYzine (VISTARIL) 25 MG capsule    3. Depression, unspecified depression type  F32.A buPROPion (WELLBUTRIN) 75 MG tablet      Past Psychiatric History: I have reviewed past psychiatric history from progress note on 09/29/2020.  Past trials of Lexapro, Wellbutrin, BuSpar, venlafaxine.  Past Medical History:  Past Medical History:  Diagnosis Date   ADHD    Anxiety    GERD (gastroesophageal reflux disease)    Wears contact lenses     Past Surgical History:  Procedure Laterality Date   ESOPHAGOGASTRODUODENOSCOPY (EGD) WITH PROPOFOL N/A 06/14/2017   Procedure: ESOPHAGOGASTRODUODENOSCOPY (EGD) WITH PROPOFOL;  Surgeon: Midge Minium, MD;  Location: [facility];  Service: Endoscopy;  Laterality: N/A;   SEPTOPLASTY  06/13/2011   WISDOM TOOTH EXTRACTION  Family Psychiatric History: I have reviewed family psychiatric history from progress note: 09/29/2020.  Family History:  Family History  Problem Relation Age of Onset   Depression Mother    Suicidality Mother    Cancer Maternal Grandfather 34       stomach   Esophageal cancer Maternal Grandfather 76   Depression Sister    Colon cancer Neg Hx    Prostate cancer Neg Hx     Diabetes Neg Hx    Hypertension Neg Hx    Hypercholesterolemia Neg Hx     Social History: I have reviewed social history from progress note on 09/29/2020. Social History   Socioeconomic History   Marital status: Divorced    Spouse name: Not on file   Number of children: 1   Years of education: Vocational   Highest education level: Not on file  Occupational History   Occupation: IT    Comment: Commuting to [employer]  Tobacco Use   Smoking status: Former    Current packs/day: 0.00    Average packs/day: 1 pack/day for 15.0 years (15.0 ttl pk-yrs)    Types: Cigarettes    Start date: 10/10/2001    Quit date: 10/10/2016    Years since quitting: 6.2   Smokeless tobacco: Former   Tobacco comments:    Quit using vaping  Vaping Use   Vaping status: Former   Quit date: 04/14/2018  Substance and Sexual Activity   Alcohol use: No   Drug use: No   Sexual activity: Yes    Birth control/protection: Surgical    Comment: Vasectomy 2013  Other Topics Concern   Not on file  Social History Narrative   Not on file   Social Determinants of Health   Financial Resource Strain: Not on file  Food Insecurity: Not on file  Transportation Needs: Not on file  Physical Activity: Not on file  Stress: Not on file  Social Connections: Not on file    Allergies: No Known Allergies  Metabolic Disorder Labs: Lab Results  Component Value Date   HGBA1C 6.1 (H) 08/09/2022   MPG 128 08/09/2022   MPG 123 07/01/2021   Lab Results  Component Value Date   PROLACTIN 8.9 05/16/2018   Lab Results  Component Value Date   CHOL 170 08/09/2022   TRIG 203 (H) 08/09/2022   HDL 48 08/09/2022   CHOLHDL 3.5 08/09/2022   LDLCALC 93 08/09/2022   LDLCALC 111 (H) 07/01/2021   Lab Results  Component Value Date   TSH 1.26 08/09/2022   TSH 1.91 07/01/2021    Therapeutic Level Labs: No results found for: "LITHIUM" No results found for: "VALPROATE" No results found for: "CBMZ"  Current  Medications: Current Outpatient Medications  Medication Sig Dispense Refill   buPROPion (WELLBUTRIN) 75 MG tablet Take 1 tablet (75 mg total) by mouth in the morning. 30 tablet 1   chlorhexidine (PERIDEX) 0.12 % solution SMARTSIG:By Mouth     clomiPHENE (CLOMID) 50 MG tablet TAKE 1/2 TABLET(25 MG) BY MOUTH DAILY 30 tablet 5   hydrOXYzine (VISTARIL) 25 MG capsule Take 1-2 capsules (25-50 mg total) by mouth at bedtime as needed for anxiety (sleep). 60 capsule 1   metoprolol succinate (TOPROL-XL) 25 MG 24 hr tablet Take 1 tablet (25 mg total) by mouth daily. 90 tablet 0   NEEDLE, DISP, 18 G (BD DISP NEEDLES) 18G X 1-1/2" MISC Use 18G to draw up Testosterone medication 50 each 0   NEEDLE, DISP, 21 G (BD ECLIPSE NEEDLE) 21G X  1-1/2" MISC Use 21G needle to inject Testosterone medication. 50 each 0   Syringe, Disposable, (2-3CC SYRINGE) 3 ML MISC Use as directed 100 each 0   venlafaxine XR (EFFEXOR-XR) 37.5 MG 24 hr capsule Take 1 capsule (37.5 mg total) by mouth daily with breakfast. Take along with  Venlafaxine 75 mg daily 90 capsule 0   venlafaxine XR (EFFEXOR-XR) 75 MG 24 hr capsule Take 1 capsule (75 mg total) by mouth daily with breakfast. Take along with 37.5 mg daily 90 capsule 0   zolpidem (AMBIEN) 5 MG tablet Take 0.5-1 tablets (2.5-5 mg total) by mouth at bedtime as needed for sleep. 10 tablet 0   No current facility-administered medications for this visit.     Musculoskeletal: Strength & Muscle Tone:  UTA Gait & Station:  Seated Patient leans: N/A  Psychiatric Specialty Exam: Review of Systems  Psychiatric/Behavioral:  Positive for decreased concentration and dysphoric mood.     There were no vitals taken for this visit.There is no height or weight on file to calculate BMI.  General Appearance: Fairly Groomed  Eye Contact:  Good  Speech:  Clear and Coherent  Volume:  Normal  Mood:  Depressed  Affect:  Congruent  Thought Process:  Goal Directed and Descriptions of Associations:  Intact  Orientation:  Full (Time, Place, and Person)  Thought Content: Logical   Suicidal Thoughts:  No  Homicidal Thoughts:  No  Memory:  Immediate;   Fair Recent;   Fair Remote;   Fair  Judgement:  Fair  Insight:  Fair  Psychomotor Activity:  Normal  Concentration:  Concentration: Good and Attention Span: Good  Recall:  Fair  Fund of Knowledge: Good  Language: Fair  Akathisia:  No  Handed:  Right  AIMS (if indicated): not done  Assets:  Communication Skills Desire for Improvement Housing Intimacy Social Support Talents/Skills Transportation  ADL's:  Intact  Cognition: WNL  Sleep:  Fair   Screenings: AIMS    Flowsheet Row Video Visit from 11/23/2021 in [facility]  AIMS Total Score 0      GAD-7    Flowsheet Row Video Visit from 10/16/2022 in [facility] Office Visit from 08/04/2022 in [facility] Office Visit from 04/12/2022 in [facility] Video Visit from 02/17/2022 in [facility] Video Visit from 12/20/2021 in [facility]  Total GAD-7 Score 9 6 6 4 5       PHQ2-9    Flowsheet Row Video Visit from 01/02/2023 in [facility] Video Visit from 10/16/2022 in [facility] Office Visit from 08/04/2022 in [facility] Office Visit from 04/12/2022 in [facility] Video Visit from 02/17/2022 in [facility]  PHQ-2 Total Score 3 1 2 1  0  PHQ-9 Total Score 5 -- 5 4 --      Flowsheet Row Video Visit from 01/02/2023 in [facility] Video Visit from 10/16/2022 in [facility] Video Visit from 07/18/2022 in  [facility]  C-SSRS RISK CATEGORY No Risk No Risk No Risk        Assessment and Plan: Zameer Borman is a 42 year old Caucasian male, has a history of GAD was evaluated by telemedicine today.  Patient currently struggling with depression symptoms  mainly lack of motivation as noted above, will benefit from addition of Wellbutrin, plan as noted below.  Plan GAD-improving Venlafaxine extended release 112.5 mg p.o. daily.  Dose reduced due to side effects Hydroxyzine 25-50 mg at bedtime as needed for anxiety and sleep.  Depression unspecified-unstable Start Wellbutrin 75 mg p.o. daily in the morning. Provided education about drug to drug interaction with metoprolol, Wellbutrin could increase metoprolol in the system.  Patient agrees to monitor his blood pressure, heart rate.  Also discussed interaction between Wellbutrin and venlafaxine.  Patient also advised about other side effects of Wellbutrin including worsening anxiety, sleep problems.  Insomnia-stable Ambien 2.5-5 mg p.o. nightly as needed Continue CPAP Has sleep problems when he does not take his medication. Reviewed  PMP AWARxE Patient also has hydroxyzine available as noted above.  Follow-up in clinic in 4 to 6 weeks or sooner if needed.    Consent: Patient/Guardian gives verbal consent for treatment and assignment of benefits for services provided during this visit. Patient/Guardian expressed understanding and agreed to proceed.   This note was generated in part or whole with voice recognition software. Voice recognition is usually quite accurate but there are transcription errors that can and very often do occur. I apologize for any typographical errors that were not detected and corrected.    Jomarie Longs, MD 01/03/2023, 9:16 AM

## 2023-01-12 ENCOUNTER — Other Ambulatory Visit: Payer: Self-pay | Admitting: Psychiatry

## 2023-01-12 DIAGNOSIS — F411 Generalized anxiety disorder: Secondary | ICD-10-CM

## 2023-01-30 DIAGNOSIS — G4733 Obstructive sleep apnea (adult) (pediatric): Secondary | ICD-10-CM | POA: Diagnosis not present

## 2023-02-02 ENCOUNTER — Other Ambulatory Visit: Payer: Self-pay | Admitting: Family Medicine

## 2023-02-02 ENCOUNTER — Encounter: Payer: Self-pay | Admitting: Family Medicine

## 2023-02-02 ENCOUNTER — Ambulatory Visit: Payer: BC Managed Care – PPO | Admitting: Family Medicine

## 2023-02-02 VITALS — BP 123/85 | HR 99 | Ht 66.0 in | Wt 246.8 lb

## 2023-02-02 DIAGNOSIS — R000 Tachycardia, unspecified: Secondary | ICD-10-CM

## 2023-02-02 DIAGNOSIS — R7309 Other abnormal glucose: Secondary | ICD-10-CM

## 2023-02-02 DIAGNOSIS — R7303 Prediabetes: Secondary | ICD-10-CM

## 2023-02-02 DIAGNOSIS — F419 Anxiety disorder, unspecified: Secondary | ICD-10-CM

## 2023-02-02 DIAGNOSIS — G4733 Obstructive sleep apnea (adult) (pediatric): Secondary | ICD-10-CM

## 2023-02-02 DIAGNOSIS — Z0000 Encounter for general adult medical examination without abnormal findings: Secondary | ICD-10-CM

## 2023-02-02 DIAGNOSIS — E781 Pure hyperglyceridemia: Secondary | ICD-10-CM

## 2023-02-02 DIAGNOSIS — Z125 Encounter for screening for malignant neoplasm of prostate: Secondary | ICD-10-CM

## 2023-02-02 LAB — POCT GLYCOSYLATED HEMOGLOBIN (HGB A1C): Hemoglobin A1C: 5.9 % — AB (ref 4.0–5.6)

## 2023-02-02 MED ORDER — METOPROLOL SUCCINATE ER 25 MG PO TB24
25.0000 mg | ORAL_TABLET | Freq: Every day | ORAL | 3 refills | Status: DC
Start: 2023-02-02 — End: 2023-02-05

## 2023-02-02 NOTE — Assessment & Plan Note (Signed)
Well controlled, chronic OSA on CPAP, now - Good adherence to CPAP nightly - Continue current CPAP therapy, patient seems to be benefiting from therapy  

## 2023-02-02 NOTE — Patient Instructions (Addendum)
Thank you for coming to the office today.  ----------------------------------------------------  For Weight Loss / Obesity only  Contrave - oral medication, appetite suppression has wellbutrin/bupropion and naltrexone in it and it can also help with appetite, it is ordered through a speciality pharmacy. - $99 per month  Free sample 7 day, 1 pill per day for 1 week  Alternative options  Semaglutide injection (mixed Ozempic) from [facility] 0.25mg  weekly for 4 weeks then increase to 0.5mg  weekly It comes in a vial and a needle syringe, you need to draw up the shot and self admin it weekly Cost is about $200 per month Call them to check pricing and availability  [facility] Address: [address] [place], [place] [address] Phone: [phone]  --------------------------------  Doctors office in [place], does not accept insurance. Call to schedule / Walk in to schedule They can do the weekly weight loss injections (same as Ozempic) Pay per visit and per shot. It may be approximately $60-75+ per visit/shot, approximately $200-300 range per month depending  [facility] Address: [address] [place], [place] [address] Phone: [phone]   DUE for FASTING BLOOD WORK (no food or drink after midnight before the lab appointment, only water or coffee without cream/sugar on the morning of)  SCHEDULE "Lab Only" visit in the morning at the clinic for lab draw in 6 MONTHS   - Make sure Lab Only appointment is at about 1 week before your next appointment, so that results will be available  For Lab Results, once available within 2-3 days of blood draw, you can can log in to MyChart online to view your results and a brief explanation. Also, we can discuss results at next follow-up visit.   Please schedule a Follow-up Appointment to: Return in about 6 months (around 08/05/2023) for 6 month fasting lab only then 1 week later Annual Physical.  If you have  any other questions or concerns, please feel free to call the office or send a message through MyChart. You may also schedule an earlier appointment if necessary.  Additionally, you may be receiving a survey about your experience at our office within a few days to 1 week by e-mail or mail. We value your feedback.  Saralyn Pilar, DO [facility], [place]

## 2023-02-02 NOTE — Progress Notes (Signed)
Subjective:    Patient ID: Robert [name], male    DOB: [birth year], 42 y.o.   MRN: [id number]  Robert [name] is a 42 y.o. male presenting on 02/02/2023 for Diabetes (Pre-diabetes.)   HPI   Obesity BMI >39 Pre-Diabetes Hyperlipidemia   A1c 5.8 to 5.9 range Due for lab Weight loss then gained  highest A1c 6.1, now down to 5.9   Off Testosterone   Generalized Anxiety Disorder Insomnia Followed by Dr [facility]. Previously w/ Dr Maryruth Bun On Venlafaxine 112.5mg  daily (37.5 + 75) Has tapered off Escitalopram. Now on new rx Venlafaxine 75mg  XR daily On Metoprolol for HR anxiety  New started Wellbutrin 75mg  dosing., will gradually adjust in future.   OSA, on CPAP - Patient reports prior history of dx OSA and on CPAP for years, prior to treatment initial symptoms were snoring, daytime sleepiness and fatigue, has had several sleep studies in the past. - Today reports that sleep apnea is well controlled. He uses the CPAP machine every night. Tolerates the machine well, and thinks that sleeps better with it and feels good. No new concerns or symptoms.  Has new CPAP machine and supplies, since last visit.  Carpal Tunnel, s/p left carpal tunnel surgery release Followed by Dr Rosita Kea, [facility] He did very well with Left hand post op. He will consider doing the R wrist hand  Dental surgery implant   BP improved on Metoprolol XL 25mg  daily      02/02/2023    3:27 PM 01/02/2023   11:47 AM 10/16/2022    4:12 PM  Depression screen PHQ 2/9  Decreased Interest 2 2 1   Down, Depressed, Hopeless 1 1 0  PHQ - 2 Score 3 3 1   Altered sleeping 3 1   Tired, decreased energy 2 0   Change in appetite 1 1   Feeling bad or failure about yourself  0 0   Trouble concentrating 0 0   Moving slowly or fidgety/restless 0 0   Suicidal thoughts 0 0   PHQ-9 Score 9 5   Difficult doing work/chores Somewhat difficult Somewhat difficult     Social History   Tobacco Use    Smoking status: Former    Current packs/day: 0.00    Average packs/day: 1 pack/day for 15.0 years (15.0 ttl pk-yrs)    Types: Cigarettes    Start date: 10/10/2001    Quit date: 10/10/2016    Years since quitting: 6.3   Smokeless tobacco: Former   Tobacco comments:    Quit using vaping  Vaping Use   Vaping status: Former   Quit date: 04/14/2018  Substance Use Topics   Alcohol use: No   Drug use: No    Review of Systems Per HPI unless specifically indicated above     Objective:    BP 123/85   Pulse 99   Ht 5\' 6"  (1.676 m)   Wt 246 lb 12.8 oz (111.9 kg)   SpO2 96%   BMI 39.83 kg/m   Wt Readings from Last 3 Encounters:  02/02/23 246 lb 12.8 oz (111.9 kg)  09/15/22 220 lb (99.8 kg)  08/04/22 233 lb (105.7 kg)    Physical Exam Vitals and nursing note reviewed.  Constitutional:      General: He is not in acute distress.    Appearance: Normal appearance. He is well-developed. He is obese. He is not diaphoretic.     Comments: Well-appearing, comfortable, cooperative  HENT:     Head: Normocephalic and atraumatic.  Eyes:     General:        Right eye: No discharge.        Left eye: No discharge.     Conjunctiva/sclera: Conjunctivae normal.  Cardiovascular:     Rate and Rhythm: Normal rate.  Pulmonary:     Effort: Pulmonary effort is normal.  Skin:    General: Skin is warm and dry.     Findings: No erythema or rash.  Neurological:     Mental Status: He is alert and oriented to person, place, and time.  Psychiatric:        Mood and Affect: Mood normal.        Behavior: Behavior normal.        Thought Content: Thought content normal.     Comments: Well groomed, good eye contact, normal speech and thoughts       Results for orders placed or performed in visit on 02/02/23  POCT glycosylated hemoglobin (Hb A1C)  Result Value Ref Range   Hemoglobin A1C 5.9 (A) 4.0 - 5.6 %      Assessment & Plan:   Problem List Items Addressed This Visit     Anxiety    Stable  chronic problem, improved on meds Off Lexapro Followed by [facility] On Wellbutrin      Relevant Medications   metoprolol succinate (TOPROL-XL) 25 MG 24 hr tablet   Elevated hemoglobin A1c - Primary    Mild elevated A1c to peak 5.9 Prior 5.5 to 5.6  Encourage improve diet, low carb optoins reviewed and handout given Consider GLP therapy, AVS info given for weight management      Relevant Orders   POCT glycosylated hemoglobin (Hb A1C) (Completed)   OSA on CPAP    Well controlled, chronic OSA on CPAP, now - Good adherence to CPAP nightly - Continue current CPAP therapy, patient seems to be benefiting from therapy       Other Visit Diagnoses     Tachycardia       Relevant Medications   metoprolol succinate (TOPROL-XL) 25 MG 24 hr tablet            Meds ordered this encounter  Medications   metoprolol succinate (TOPROL-XL) 25 MG 24 hr tablet    Sig: Take 1 tablet (25 mg total) by mouth daily.    Dispense:  90 tablet    Refill:  3    Follow up plan: Return in about 6 months (around 08/05/2023) for 6 month fasting lab only then 1 week later Annual Physical.  Future labs ordered for 07/2023   Saralyn Pilar, DO [facility] 02/02/2023, 3:23 PM

## 2023-02-02 NOTE — Assessment & Plan Note (Signed)
Stable chronic problem, improved on meds Off Lexapro Followed by [facility] On Wellbutrin

## 2023-02-02 NOTE — Assessment & Plan Note (Signed)
Mild elevated A1c to peak 5.9 Prior 5.5 to 5.6  Encourage improve diet, low carb optoins reviewed and handout given Consider GLP therapy, AVS info given for weight management

## 2023-02-03 ENCOUNTER — Other Ambulatory Visit: Payer: Self-pay | Admitting: Family Medicine

## 2023-02-03 DIAGNOSIS — F419 Anxiety disorder, unspecified: Secondary | ICD-10-CM

## 2023-02-03 DIAGNOSIS — R000 Tachycardia, unspecified: Secondary | ICD-10-CM

## 2023-02-05 NOTE — Telephone Encounter (Signed)
Change of pharmacy request Requested Prescriptions  Pending Prescriptions Disp Refills   metoprolol succinate (TOPROL-XL) 25 MG 24 hr tablet [Pharmacy Med Name: METOPROLOL ER SUCCINATE 25MG  TABS] 90 tablet 3    Sig: TAKE 1 TABLET(25 MG) BY MOUTH DAILY     Cardiovascular:  Beta Blockers Passed - 02/03/2023  3:16 AM      Passed - Last BP in normal range    BP Readings from Last 1 Encounters:  02/02/23 123/85         Passed - Last Heart Rate in normal range    Pulse Readings from Last 1 Encounters:  02/02/23 99         Passed - Valid encounter within last 6 months    Recent Outpatient Visits           3 days ago Elevated hemoglobin A1c   [place] [facility] [place], Netta Neat, DO   6 months ago Annual physical exam   [facility] Smitty Cords, DO   1 year ago Pre-diabetes   [place] [facility] Smitty Cords, DO   1 year ago Annual physical exam   [place] [facility] Smitty Cords, DO   1 year ago Screening for STD (sexually transmitted disease)   [place] [facility] Althea Charon, Netta Neat, DO       Future Appointments             In 6 months Althea Charon, Netta Neat, DO [place] [facility]   In 7 months Stoioff, Verna Czech, MD [facility]

## 2023-02-15 ENCOUNTER — Telehealth: Payer: PRIVATE HEALTH INSURANCE | Admitting: Psychiatry

## 2023-02-19 ENCOUNTER — Telehealth (INDEPENDENT_AMBULATORY_CARE_PROVIDER_SITE_OTHER): Payer: PRIVATE HEALTH INSURANCE | Admitting: Psychiatry

## 2023-02-19 ENCOUNTER — Encounter: Payer: Self-pay | Admitting: Psychiatry

## 2023-02-19 DIAGNOSIS — F3289 Other specified depressive episodes: Secondary | ICD-10-CM

## 2023-02-19 DIAGNOSIS — F5101 Primary insomnia: Secondary | ICD-10-CM | POA: Diagnosis not present

## 2023-02-19 DIAGNOSIS — Z87898 Personal history of other specified conditions: Secondary | ICD-10-CM

## 2023-02-19 DIAGNOSIS — F411 Generalized anxiety disorder: Secondary | ICD-10-CM

## 2023-02-19 MED ORDER — HYDROXYZINE PAMOATE 25 MG PO CAPS
25.0000 mg | ORAL_CAPSULE | Freq: Every evening | ORAL | 1 refills | Status: DC | PRN
Start: 2023-02-19 — End: 2023-05-02

## 2023-02-19 MED ORDER — VENLAFAXINE HCL ER 150 MG PO CP24: 150.0000 mg | ORAL_CAPSULE | Freq: Every day | ORAL | 0 refills | Status: DC

## 2023-02-19 NOTE — Progress Notes (Signed)
Virtual Visit via Video Note  I connected with Robert [name] on 02/19/23 at  2:30 PM EDT by a video enabled telemedicine application and verified that I am speaking with the correct person using two identifiers.  Location Provider Location : [facility] Patient Location : Home  Participants: Patient , Provider    I discussed the limitations of evaluation and management by telemedicine and the availability of in person appointments. The patient expressed understanding and agreed to proceed.  I discussed the assessment and treatment plan with the patient. The patient was provided an opportunity to ask questions and all were answered. The patient agreed with the plan and demonstrated an understanding of the instructions.   The patient was advised to call back or seek an in-person evaluation if the symptoms worsen or if the condition fails to improve as anticipated.   BH MD OP Progress Note  02/19/2023 3:05 PM Robert [name]  MRN:  [id number]  Chief Complaint:  Chief Complaint  Patient presents with   Follow-up   Anxiety   Depression   Medication Refill   HPI: Robert [name] is a 42 year old Caucasian male, lives in [place], employed, has a history of GAD, insomnia, Tourette's disorder, GERD was evaluated by telemedicine today.  Patient today reports anxiety symptoms are manageable on the venlafaxine.  Patient however reports he continues to struggle with lack of motivation.  He is not sure if this is due to being depressed versus due to psychosocial stressors.  He reports he is not very happy with his current job situation.  He works from home daily.  Patient reports he likes to go into the office and engage with others rather than just stay home.  He is looking into that option at this time.  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  Patient has tried venlafaxine 150 mg several months ago.  At that  time patient had reported having sexual side effects.  Patient however today reports he is interested in going back up on the venlafaxine to give it another chance.  Patient could not elaborate further regarding his depression symptoms.  Patient reports he had to stop the Wellbutrin since he started feeling anxious and on edge when he started taking it.  He gave it around 3 weeks and his symptoms did not get better.  Patient reports sleep is overall good.  Patient denies any suicidality, homicidality or perceptual disturbances.  Patient is currently in psychotherapy, motivated to stay in therapy.    Visit Diagnosis:    ICD-10-CM   1. GAD (generalized anxiety disorder)  F41.1 venlafaxine XR (EFFEXOR XR) 150 MG 24 hr capsule    hydrOXYzine (VISTARIL) 25 MG capsule    2. Primary insomnia  F51.01 hydrOXYzine (VISTARIL) 25 MG capsule    3. Other specified depressive episodes  F32.89 venlafaxine XR (EFFEXOR XR) 150 MG 24 hr capsule   Depressive episodes with insufficent symptoms    4. History of Gilles de la Tourette's syndrome  Z87.898       Past Psychiatric History: I have reviewed past psychiatric history from progress note on 09/29/2020.  Past trials of Lexapro, Wellbutrin, BuSpar, venlafaxine.    Past Medical History:  Past Medical History:  Diagnosis Date   ADHD    Anxiety    GERD (gastroesophageal reflux disease)    Wears contact lenses     Past Surgical History:  Procedure Laterality Date  ESOPHAGOGASTRODUODENOSCOPY (EGD) WITH PROPOFOL N/A 06/14/2017   Procedure: ESOPHAGOGASTRODUODENOSCOPY (EGD) WITH PROPOFOL;  Surgeon: Midge Minium, MD;  [facility];  Service: Endoscopy;  Laterality: N/A;   SEPTOPLASTY  06/13/2011   WISDOM TOOTH EXTRACTION      Family Psychiatric History: I have reviewed family psychiatric history from progress note on 09/29/2020.  Family History:  Family History  Problem Relation Age of Onset   Depression Mother    Suicidality  Mother    Cancer Maternal Grandfather 57       stomach   Esophageal cancer Maternal Grandfather 61   Depression Sister    Colon cancer Neg Hx    Prostate cancer Neg Hx    Diabetes Neg Hx    Hypertension Neg Hx    Hypercholesterolemia Neg Hx     Social History: I have reviewed social history from progress note on 09/29/2020. Social History   Socioeconomic History   Marital status: Divorced    Spouse name: Not on file   Number of children: 1   Years of education: Vocational   Highest education level: Not on file  Occupational History   Occupation: IT    Comment: Commuting to [employer]  Tobacco Use   Smoking status: Former    Current packs/day: 0.00    Average packs/day: 1 pack/day for 15.0 years (15.0 ttl pk-yrs)    Types: Cigarettes    Start date: 10/10/2001    Quit date: 10/10/2016    Years since quitting: 6.3   Smokeless tobacco: Former   Tobacco comments:    Quit using vaping  Vaping Use   Vaping status: Former   Quit date: 04/14/2018  Substance and Sexual Activity   Alcohol use: No   Drug use: No   Sexual activity: Yes    Birth control/protection: Surgical    Comment: Vasectomy 2013  Other Topics Concern   Not on file  Social History Narrative   Not on file   Social Determinants of Health   Financial Resource Strain: Not on file  Food Insecurity: Not on file  Transportation Needs: Not on file  Physical Activity: Not on file  Stress: Not on file  Social Connections: Not on file    Allergies: No Known Allergies  Metabolic Disorder Labs: Lab Results  Component Value Date   HGBA1C 5.9 (A) 02/02/2023   MPG 128 08/09/2022   MPG 123 07/01/2021   Lab Results  Component Value Date   PROLACTIN 8.9 05/16/2018   Lab Results  Component Value Date   CHOL 170 08/09/2022   TRIG 203 (H) 08/09/2022   HDL 48 08/09/2022   CHOLHDL 3.5 08/09/2022   LDLCALC 93 08/09/2022   LDLCALC 111 (H) 07/01/2021   Lab Results  Component Value Date   TSH 1.26 08/09/2022    TSH 1.91 07/01/2021    Therapeutic Level Labs: No results found for: "LITHIUM" No results found for: "VALPROATE" No results found for: "CBMZ"  Current Medications: Current Outpatient Medications  Medication Sig Dispense Refill   venlafaxine XR (EFFEXOR XR) 150 MG 24 hr capsule Take 1 capsule (150 mg total) by mouth daily with breakfast. 90 capsule 0   chlorhexidine (PERIDEX) 0.12 % solution SMARTSIG:By Mouth     clomiPHENE (CLOMID) 50 MG tablet TAKE 1/2 TABLET(25 MG) BY MOUTH DAILY 30 tablet 5   hydrOXYzine (VISTARIL) 25 MG capsule Take 1-2 capsules (25-50 mg total) by mouth at bedtime as needed for anxiety (sleep). 60 capsule 1   metoprolol succinate (TOPROL-XL) 25 MG  24 hr tablet TAKE 1 TABLET(25 MG) BY MOUTH DAILY 90 tablet 3   NEEDLE, DISP, 18 G (BD DISP NEEDLES) 18G X 1-1/2" MISC Use 18G to draw up Testosterone medication 50 each 0   NEEDLE, DISP, 21 G (BD ECLIPSE NEEDLE) 21G X 1-1/2" MISC Use 21G needle to inject Testosterone medication. 50 each 0   Syringe, Disposable, (2-3CC SYRINGE) 3 ML MISC Use as directed 100 each 0   zolpidem (AMBIEN) 5 MG tablet Take 0.5-1 tablets (2.5-5 mg total) by mouth at bedtime as needed for sleep. 10 tablet 0   No current facility-administered medications for this visit.     Musculoskeletal: Strength & Muscle Tone:  UTA Gait & Station:  UTA Patient leans: N/A  Psychiatric Specialty Exam: Review of Systems  Psychiatric/Behavioral:  The patient is nervous/anxious.        Motivation problem    There were no vitals taken for this visit.There is no height or weight on file to calculate BMI.  General Appearance: Casual  Eye Contact:  Fair  Speech:  Normal Rate, patient does have vocal tics although manageable  Volume:  Normal  Mood:  Anxious  Affect:  Congruent  Thought Process:  Goal Directed and Descriptions of Associations: Intact  Orientation:  Full (Time, Place, and Person)  Thought Content: Logical   Suicidal Thoughts:  No   Homicidal Thoughts:  No  Memory:  Immediate;   Fair Recent;   Fair Remote;   Fair  Judgement:  Fair  Insight:  Fair  Psychomotor Activity:  Normal  Concentration:  Concentration: Fair and Attention Span: Fair  Recall:  [employer] of Knowledge: Fair  Language: Fair  Akathisia:  No  Handed:  Right  AIMS (if indicated): not done  Assets:  Communication Skills Desire for Improvement Housing Social Support  ADL's:  Intact  Cognition: WNL  Sleep:  Fair   Screenings: AIMS    Flowsheet Row Video Visit from 11/23/2021 in [facility]  AIMS Total Score 0      GAD-7    Flowsheet Row Office Visit from [facility]/02/2023 [facility] Video Visit from 10/16/2022 in [facility] Office Visit from 08/04/2022 in [facility] Office Visit from 04/12/2022 in [facility] Video Visit from 02/17/2022 in [facility]  Total GAD-7 Score 8 9 6 6 4       PHQ2-9    Flowsheet Row Video Visit from 02/19/2023 in [facility] Office Visit from 02/02/2023 in [facility] Video Visit from 01/02/2023 in [facility] Video Visit from 10/16/2022 in [facility] Office Visit from 08/04/2022 in [facility]  PHQ-2 Total Score 1 3 3 1 2   PHQ-9 Total Score -- 9 5 -- 5      Flowsheet Row Video Visit from 02/19/2023 in [facility] Video Visit from 01/02/2023 in [facility] Video Visit from 10/16/2022 in [facility]  C-SSRS RISK CATEGORY No Risk No Risk No Risk        Assessment and Plan: Miriam Biondo is a 42 year old  Caucasian male who has a history of GAD was evaluated by telemedicine today.  Patient with continued symptoms of lack of motivation although unable to elaborate further  depression symptoms, will benefit from dosage readjustment of venlafaxine or addition of a mood stabilizer, plan as noted below.  Plan GAD-improving Increase venlafaxine extended release to 150 mg p.o. daily Hydroxyzine 25-50 mg at bedtime as needed for anxiety and sleep  Other specified depression disorder-depressive episode with insufficient symptoms-unstable Discontinue Wellbutrin for side effects Increase venlafaxine to extended release 150 mg p.o. daily Continue CBT, patient is motivated to do so.  Insomnia-stable Ambien 2.5-5 mg p.o. nightly as needed Continue CPAP     Collaboration of Care: Collaboration of Care: Referral or follow-up with counselor/therapist AEB patient encouraged to continue CBT  Patient/Guardian was advised Release of Information must be obtained prior to any record release in order to collaborate their care with an outside provider. Patient/Guardian was advised if they have not already done so to contact the registration department to sign all necessary forms in order for [place] to release information regarding their care.   Consent: Patient/Guardian gives verbal consent for treatment and assignment of benefits for services provided during this visit. Patient/Guardian expressed understanding and agreed to proceed.   Follow-up in clinic in 8 to 10 weeks or sooner in person  This note was generated in part or whole with voice recognition software. Voice recognition is usually quite accurate but there are transcription errors that can and very often do occur. I apologize for any typographical errors that were not detected and corrected.    Jomarie Longs, MD 02/19/2023, 3:05 PM

## 2023-02-22 ENCOUNTER — Encounter: Payer: Self-pay | Admitting: Urology

## 2023-02-22 ENCOUNTER — Other Ambulatory Visit: Payer: Self-pay | Admitting: *Deleted

## 2023-03-02 DIAGNOSIS — G4733 Obstructive sleep apnea (adult) (pediatric): Secondary | ICD-10-CM | POA: Diagnosis not present

## 2023-03-14 ENCOUNTER — Other Ambulatory Visit: Payer: Self-pay | Admitting: *Deleted

## 2023-03-14 DIAGNOSIS — E291 Testicular hypofunction: Secondary | ICD-10-CM

## 2023-03-15 ENCOUNTER — Other Ambulatory Visit: Payer: BC Managed Care – PPO

## 2023-03-15 DIAGNOSIS — E291 Testicular hypofunction: Secondary | ICD-10-CM | POA: Diagnosis not present

## 2023-03-16 LAB — HEMOGLOBIN AND HEMATOCRIT, BLOOD
Hematocrit: 49.6 % (ref 37.5–51.0)
Hemoglobin: 16.5 g/dL (ref 13.0–17.7)

## 2023-03-16 LAB — TESTOSTERONE: Testosterone: 402 ng/dL (ref 264–916)

## 2023-04-01 DIAGNOSIS — G4733 Obstructive sleep apnea (adult) (pediatric): Secondary | ICD-10-CM | POA: Diagnosis not present

## 2023-04-11 ENCOUNTER — Encounter: Payer: Self-pay | Admitting: Family Medicine

## 2023-05-02 ENCOUNTER — Encounter: Payer: Self-pay | Admitting: Psychiatry

## 2023-05-02 ENCOUNTER — Ambulatory Visit (INDEPENDENT_AMBULATORY_CARE_PROVIDER_SITE_OTHER): Payer: PRIVATE HEALTH INSURANCE | Admitting: Psychiatry

## 2023-05-02 VITALS — BP 138/85 | HR 116 | Temp 98.2°F | Ht 66.0 in | Wt 252.6 lb

## 2023-05-02 DIAGNOSIS — F411 Generalized anxiety disorder: Secondary | ICD-10-CM | POA: Diagnosis not present

## 2023-05-02 DIAGNOSIS — Z6841 Body Mass Index (BMI) 40.0 and over, adult: Secondary | ICD-10-CM | POA: Insufficient documentation

## 2023-05-02 DIAGNOSIS — F3289 Other specified depressive episodes: Secondary | ICD-10-CM | POA: Diagnosis not present

## 2023-05-02 DIAGNOSIS — F5101 Primary insomnia: Secondary | ICD-10-CM

## 2023-05-02 DIAGNOSIS — E66813 Obesity, class 3: Secondary | ICD-10-CM | POA: Insufficient documentation

## 2023-05-02 DIAGNOSIS — G4733 Obstructive sleep apnea (adult) (pediatric): Secondary | ICD-10-CM | POA: Diagnosis not present

## 2023-05-02 DIAGNOSIS — Z79899 Other long term (current) drug therapy: Secondary | ICD-10-CM | POA: Insufficient documentation

## 2023-05-02 DIAGNOSIS — Z87898 Personal history of other specified conditions: Secondary | ICD-10-CM | POA: Diagnosis not present

## 2023-05-02 MED ORDER — VENLAFAXINE HCL ER 150 MG PO CP24
150.0000 mg | ORAL_CAPSULE | Freq: Every day | ORAL | 1 refills | Status: DC
Start: 2023-05-02 — End: 2023-10-28

## 2023-05-02 MED ORDER — HYDROXYZINE PAMOATE 25 MG PO CAPS
25.0000 mg | ORAL_CAPSULE | Freq: Every evening | ORAL | 2 refills | Status: AC | PRN
Start: 2023-05-02 — End: ?

## 2023-05-02 NOTE — Progress Notes (Unsigned)
BH MD OP Progress Note  05/02/2023 3:12 PM Robert [name]  MRN:  [id number]  Chief Complaint:  Chief Complaint  Patient presents with   Follow-up   Depression   Anxiety   Medication Refill   HPI: Robert [name] is a 42 year old Caucasian male, lives in [place], employed, has a history of GAD, insomnia, Tourette's disorder, GERD was evaluated in the office today.  Patient today reports he has been feeling more and more overwhelmed since the holiday season is coming up.  Patient reports he has a lot of things that he has to take care of during the holiday season with work as well as otherwise.  Patient however reports he has been trying to make a checklist and trying to follow that.  He also has been trying to be more mindful, trying grounding strategies.  Patient reports the last 2 or 3 days he has been feeling dizzy.  He does not believe it is vertigo since he does have a history of it.  Patient reports he wonders if it is anxiety induced.  He does have hydroxyzine available however he does not take it every night.  Patient reports sleep is overall good.  He does not use the Ambien often.  Patient currently denies any suicidality, homicidality or perceptual disturbances.  Patient is currently compliant on the venlafaxine, denies side effects.  Patient agreeable to trial of low dose clonazepam as needed for severe anxiety symptoms.  Patient agrees to get urine drug screen prior to prescribing.  Patient denies any other concerns today.  Visit Diagnosis:    ICD-10-CM   1. GAD (generalized anxiety disorder)  F41.1 venlafaxine XR (EFFEXOR XR) 150 MG 24 hr capsule    hydrOXYzine (VISTARIL) 25 MG capsule    2. Primary insomnia  F51.01 hydrOXYzine (VISTARIL) 25 MG capsule    3. Other specified depressive episodes  F32.89 venlafaxine XR (EFFEXOR XR) 150 MG 24 hr capsule   Depressive episodes with insufficent symptoms    4. History of Gilles de la Tourette's syndrome  Z87.898     5.  High risk medication use  Z79.899 Urine drugs of abuse scrn w alc, routine (Ref Lab)      Past Psychiatric History: I have reviewed past psychiatric history from progress note on 09/29/2020.  Past trials of Lexapro, Wellbutrin, BuSpar, venlafaxine.  Past Medical History:  Past Medical History:  Diagnosis Date   ADHD    Anxiety    GERD (gastroesophageal reflux disease)    Wears contact lenses     Past Surgical History:  Procedure Laterality Date   ESOPHAGOGASTRODUODENOSCOPY (EGD) WITH PROPOFOL N/A 06/14/2017   Procedure: ESOPHAGOGASTRODUODENOSCOPY (EGD) WITH PROPOFOL;  Surgeon: Midge Minium, MD;  Location: [facility];  Service: Endoscopy;  Laterality: N/A;   SEPTOPLASTY  06/13/2011   WISDOM TOOTH EXTRACTION      Family Psychiatric History: I have reviewed family psychiatric history from progress note on 09/29/2020.  Family History:  Family History  Problem Relation Age of Onset   Depression Mother    Suicidality Mother    Cancer Maternal Grandfather 72       stomach   Esophageal cancer Maternal Grandfather 94   Depression Sister    Colon cancer Neg Hx    Prostate cancer Neg Hx    Diabetes Neg Hx    Hypertension Neg Hx    Hypercholesterolemia Neg Hx     Social History: I have reviewed social history from progress note on 09/29/2020. Social History   Socioeconomic  History   Marital status: Divorced    Spouse name: Not on file   Number of children: 1   Years of education: Vocational   Highest education level: Not on file  Occupational History   Occupation: IT    Comment: Commuting to [employer]  Tobacco Use   Smoking status: Former    Current packs/day: 0.00    Average packs/day: 1 pack/day for 15.0 years (15.0 ttl pk-yrs)    Types: Cigarettes    Start date: 10/10/2001    Quit date: 10/10/2016    Years since quitting: 6.5   Smokeless tobacco: Former   Tobacco comments:    Quit using vaping  Vaping Use   Vaping status: Former   Quit date: 04/14/2018   Substance and Sexual Activity   Alcohol use: No   Drug use: No   Sexual activity: Yes    Birth control/protection: Surgical    Comment: Vasectomy 2013  Other Topics Concern   Not on file  Social History Narrative   Not on file   Social Determinants of Health   Financial Resource Strain: Not on file  Food Insecurity: Not on file  Transportation Needs: Not on file  Physical Activity: Not on file  Stress: Not on file  Social Connections: Not on file    Allergies: No Known Allergies  Metabolic Disorder Labs: Lab Results  Component Value Date   HGBA1C 5.9 (A) 02/02/2023   MPG 128 08/09/2022   MPG 123 07/01/2021   Lab Results  Component Value Date   PROLACTIN 8.9 05/16/2018   Lab Results  Component Value Date   CHOL 170 08/09/2022   TRIG 203 (H) 08/09/2022   HDL 48 08/09/2022   CHOLHDL 3.5 08/09/2022   LDLCALC 93 08/09/2022   LDLCALC 111 (H) 07/01/2021   Lab Results  Component Value Date   TSH 1.26 08/09/2022   TSH 1.91 07/01/2021    Therapeutic Level Labs: No results found for: "LITHIUM" No results found for: "VALPROATE" No results found for: "CBMZ"  Current Medications: Current Outpatient Medications  Medication Sig Dispense Refill   chlorhexidine (PERIDEX) 0.12 % solution SMARTSIG:By Mouth     clomiPHENE (CLOMID) 50 MG tablet TAKE 1/2 TABLET(25 MG) BY MOUTH DAILY 30 tablet 5   metoprolol succinate (TOPROL-XL) 25 MG 24 hr tablet TAKE 1 TABLET(25 MG) BY MOUTH DAILY 90 tablet 3   NEEDLE, DISP, 18 G (BD DISP NEEDLES) 18G X 1-1/2" MISC Use 18G to draw up Testosterone medication 50 each 0   NEEDLE, DISP, 21 G (BD ECLIPSE NEEDLE) 21G X 1-1/2" MISC Use 21G needle to inject Testosterone medication. 50 each 0   Syringe, Disposable, (2-3CC SYRINGE) 3 ML MISC Use as directed 100 each 0   zolpidem (AMBIEN) 5 MG tablet Take 0.5-1 tablets (2.5-5 mg total) by mouth at bedtime as needed for sleep. 10 tablet 0   hydrOXYzine (VISTARIL) 25 MG capsule Take 1-2 capsules  (25-50 mg total) by mouth at bedtime as needed for anxiety (sleep). 60 capsule 2   venlafaxine XR (EFFEXOR XR) 150 MG 24 hr capsule Take 1 capsule (150 mg total) by mouth daily with breakfast. 90 capsule 1   No current facility-administered medications for this visit.     Musculoskeletal: Strength & Muscle Tone: within normal limits Gait & Station: normal Patient leans: N/A  Psychiatric Specialty Exam: Review of Systems  Psychiatric/Behavioral:  The patient is nervous/anxious.     Blood pressure 138/85, pulse (!) 116, temperature 98.2 F (36.8 C), temperature source  Skin, height 5\' 6"  (1.676 m), weight 252 lb 9.6 oz (114.6 kg).Body mass index is 40.77 kg/m.  General Appearance: Fairly Groomed  Eye Contact:  Fair  Speech:  Clear and Coherent  Volume:  Normal  Mood:  Anxious  Affect:  Appropriate  Thought Process:  Goal Directed and Descriptions of Associations: Intact  Orientation:  Full (Time, Place, and Person)  Thought Content: Logical   Suicidal Thoughts:  No  Homicidal Thoughts:  No  Memory:  Immediate;   Fair Recent;   Fair Remote;   Fair  Judgement:  Fair  Insight:  Fair  Psychomotor Activity:  Normal  Concentration:  Concentration: Fair and Attention Span: Fair  Recall:  [employer] of Knowledge: Fair  Language: Fair  Akathisia:  No  Handed:  Right  AIMS (if indicated): not done  Assets:  Desire for Improvement Housing Intimacy Social Support  ADL's:  Intact  Cognition: WNL  Sleep:  Fair   Screenings: AIMS    Flowsheet Row Video Visit from 11/23/2021 in [facility]  AIMS Total Score 0      GAD-7    Flowsheet Row Office Visit from 05/02/2023 in [facility] Office Visit from 02/02/2023 in [facility] Video Visit from 10/16/2022 in [facility] Office Visit from 08[facility]04/2022 in [facility] Office Visit from 04/12/2022 in [facility]  Total GAD-7 Score 10 8 9 6 6       PHQ2-9    Flowsheet Row Office Visit from 05/02/2023 in [facility] Video Visit from 02/19/2023 in [facility] Office Visit from 02/02/2023 in [facility] Video Visit from 01/02/2023 in [facility] Video Visit from 10/16/2022 in [facility]  PHQ-2 Total Score 2 1 3 3 1   PHQ-9 Total Score 10 -- 9 5 --      Flowsheet Row Office Visit from 05/02/2023 in [facility] Video Visit from 02/19/2023 in [facility] Video Visit from 01/02/2023 in [facility]  C-SSRS RISK CATEGORY No Risk No Risk No Risk        Assessment and Plan: Robert [name] is a 42 year old Caucasian male who has a history of GAD, depression, was evaluated was evaluated in office today.  Patient with continued anxiety symptoms as well as concerns about weight gain likely multifactorial, will benefit from the following plan.  Plan GAD-improving Continue venlafaxine extended release 150 mg p.o. daily Hydroxyzine 25-50 mg at bedtime as needed for anxiety and sleep Will consider adding low dosage clonazepam or patient will need a urine drug screen.  Patient advised he could use clonazepam as needed limitedly, provided education about long-term use of benzodiazepine therapy including habit-forming potential.  Other specified depression disorder-depression episode with insufficient symptoms-improving Venlafaxine extended release 150 mg p.o. daily Continue CBT as needed  Insomnia- stable Ambien 2.5-5 mg p.o. nightly as needed Continue CPAP Reviewed [place] PMP AWARxE  Provided information for weight  management provider.  Patient to establish care.  Collaboration of Care: Collaboration of Care: Other patient advised to establish care with weight management provider.  Provided resources.  Patient/Guardian was advised Release of Information must be obtained prior to any record release in order to collaborate their care with an  outside provider. Patient/Guardian was advised if they have not already done so to contact the registration department to sign all necessary forms in order for [place] to release information regarding their care.   Consent: Patient/Guardian gives verbal consent for treatment and assignment of benefits for services provided during this visit. Patient/Guardian expressed understanding and agreed to proceed.   Follow-up in clinic in 8 weeks or sooner if needed.  This note was generated in part or whole with voice recognition software. Voice recognition is usually quite accurate but there are transcription errors that can and very often do occur. I apologize for any typographical errors that were not detected and corrected.    Jomarie Longs, MD 05/02/2023, 3:12 PM

## 2023-05-17 ENCOUNTER — Telehealth: Payer: Self-pay | Admitting: Psychiatry

## 2023-05-17 DIAGNOSIS — Z79899 Other long term (current) drug therapy: Secondary | ICD-10-CM

## 2023-05-17 NOTE — Telephone Encounter (Signed)
error 

## 2023-05-17 NOTE — Telephone Encounter (Signed)
I have placed an order for [facility] for urine drug screen.  Previous lab was ordered for [facility] lab.  Patient wants to go to [facility]  I have also printed out lab slip and will give it to staff to fax it to the [facility] location just in case he is still having trouble.

## 2023-05-19 ENCOUNTER — Telehealth: Payer: Self-pay | Admitting: Psychiatry

## 2023-05-19 DIAGNOSIS — F411 Generalized anxiety disorder: Secondary | ICD-10-CM

## 2023-05-19 LAB — URINE DRUGS OF ABUSE SCREEN W ALC, ROUTINE (REF LAB)
Amphetamines, Urine: NEGATIVE ng/mL
Barbiturate Quant, Ur: NEGATIVE ng/mL
Benzodiazepine Quant, Ur: NEGATIVE ng/mL
Cannabinoid Quant, Ur: NEGATIVE ng/mL
Cocaine (Metab.): NEGATIVE ng/mL
Ethanol, Urine: NEGATIVE %
Methadone Screen, Urine: NEGATIVE ng/mL
Opiate Quant, Ur: NEGATIVE ng/mL
PCP Quant, Ur: NEGATIVE ng/mL
Propoxyphene: NEGATIVE ng/mL

## 2023-05-19 MED ORDER — CLONAZEPAM 0.5 MG PO TABS: 0.2500 mg | ORAL_TABLET | Freq: Every day | ORAL | 0 refills | Status: DC | PRN

## 2023-05-19 NOTE — Telephone Encounter (Signed)
I have reviewed urine drug screen which resulted 05/19/2023-within normal limits.  I have sent clonazepam as discussed during previous visit to pharmacy ,patient to limit use.

## 2023-05-21 NOTE — Telephone Encounter (Signed)
Pt.notified

## 2023-06-01 DIAGNOSIS — G4733 Obstructive sleep apnea (adult) (pediatric): Secondary | ICD-10-CM | POA: Diagnosis not present

## 2023-06-15 ENCOUNTER — Encounter: Payer: Self-pay | Admitting: Urology

## 2023-06-20 ENCOUNTER — Other Ambulatory Visit: Payer: Self-pay | Admitting: Urology

## 2023-06-20 MED ORDER — TESTOSTERONE 20.25 MG/ACT (1.62%) TD GEL: TRANSDERMAL | 1 refills | Status: DC

## 2023-06-25 DIAGNOSIS — G5601 Carpal tunnel syndrome, right upper limb: Secondary | ICD-10-CM | POA: Diagnosis not present

## 2023-07-02 DIAGNOSIS — G4733 Obstructive sleep apnea (adult) (pediatric): Secondary | ICD-10-CM | POA: Diagnosis not present

## 2023-07-09 ENCOUNTER — Telehealth (INDEPENDENT_AMBULATORY_CARE_PROVIDER_SITE_OTHER): Payer: BC Managed Care – PPO | Admitting: Psychiatry

## 2023-07-09 ENCOUNTER — Encounter: Payer: Self-pay | Admitting: Psychiatry

## 2023-07-09 DIAGNOSIS — Z87898 Personal history of other specified conditions: Secondary | ICD-10-CM

## 2023-07-09 DIAGNOSIS — F5101 Primary insomnia: Secondary | ICD-10-CM | POA: Diagnosis not present

## 2023-07-09 DIAGNOSIS — F411 Generalized anxiety disorder: Secondary | ICD-10-CM | POA: Diagnosis not present

## 2023-07-09 DIAGNOSIS — F3289 Other specified depressive episodes: Secondary | ICD-10-CM | POA: Diagnosis not present

## 2023-07-09 DIAGNOSIS — F17200 Nicotine dependence, unspecified, uncomplicated: Secondary | ICD-10-CM

## 2023-07-09 MED ORDER — VARENICLINE TARTRATE 0.5 MG PO TABS: 0.5000 mg | ORAL_TABLET | Freq: Two times a day (BID) | ORAL | 1 refills | Status: DC

## 2023-07-09 NOTE — Progress Notes (Signed)
Virtual Visit via Video Note  I connected with Robert [name] on 07/09/23 at  3:30 PM EST by a video enabled telemedicine application and verified that I am speaking with the correct person using two identifiers.  Location Provider Location : [facility] Patient Location : Home  Participants: Patient , Provider   I discussed the limitations of evaluation and management by telemedicine and the availability of in person appointments. The patient expressed understanding and agreed to proceed.    I discussed the assessment and treatment plan with the patient. The patient was provided an opportunity to ask questions and all were answered. The patient agreed with the plan and demonstrated an understanding of the instructions.   The patient was advised to call back or seek an in-person evaluation if the symptoms worsen or if the condition fails to improve as anticipated.   BH MD OP Progress Note  07/09/2023 4:51 PM Robert [name]  MRN:  [id number]  Chief Complaint:  Chief Complaint  Patient presents with   Follow-up   Depression   Anxiety   Medication Refill   HPI: Robert [name] is a 43 year old Caucasian male, lives in [place], employed, has a history of GAD, insomnia, Tourette's disorder, GERD was evaluated by telemedicine today.  The patient has been experiencing increased anxiety over the past few months, which he attributes to recent life events. Despite this, he reports that his current dose of venlafaxine (150mg ) is effective and he does not wish to alter it. The patient underwent planned carpal tunnel surgery on the right hand in January, having had the left hand operated on the previous year. However, complications arose, necessitating a second surgery. The patient also underwent dental work, which included a bone graft due to an unexpected issue with a dental implant. The patient reported experiencing anxiety during these procedures, which was managed with a medication that caused initial  discomfort but ultimately helped him relax.  The patient has been experiencing sleep issues, which he manages with hydroxyzine, taken once or twice a week. He also has doxepin on hand but uses it infrequently due to its side effects. The patient has not been using his prescribed Ambien or Wellbutrin, the latter of which he stopped due to irritability.  The patient disclosed that he has resumed smoking, consuming close to a pack a day since the end of 2023. He expressed interest in using Chantix for smoking cessation. The patient also expressed interest in weight loss injections, specifically tirzepatide, but noted that insurance has not covered this in the past.   Patient in summary has anxiety mostly situational due to recent stressors although he is managing it on the current medication regimen.  Patient denies any suicidality, homicidality or perceptual disturbances.     Visit Diagnosis:    ICD-10-CM   1. GAD (generalized anxiety disorder)  F41.1     2. Primary insomnia  F51.01     3. Other specified depressive episodes  F32.89    Depressive episode with insufficient symptoms.    4. History of Gilles de la Tourette's syndrome  Z87.898     5. Tobacco use disorder  F17.200 varenicline (CHANTIX) 0.5 MG tablet      Past Psychiatric History: I have reviewed past psychiatric history from progress note on 09/29/2020.  Past trials of Lexapro, Wellbutrin, BuSpar, venlafaxine.  Past Medical History:  Past Medical History:  Diagnosis Date   ADHD    Anxiety    GERD (gastroesophageal reflux disease)    Wears contact lenses  Past Surgical History:  Procedure Laterality Date   ESOPHAGOGASTRODUODENOSCOPY (EGD) WITH PROPOFOL N/A 06/14/2017   Procedure: ESOPHAGOGASTRODUODENOSCOPY (EGD) WITH PROPOFOL;  Surgeon: Midge Minium, MD;  [facility];  Service: Endoscopy;  Laterality: N/A;   SEPTOPLASTY  06/13/2011   WISDOM TOOTH EXTRACTION      Family Psychiatric History: I have  reviewed family psychiatric history from progress note on 09/29/2020.  Family History:  Family History  Problem Relation Age of Onset   Depression Mother    Suicidality Mother    Cancer Maternal Grandfather 28       stomach   Esophageal cancer Maternal Grandfather 54   Depression Sister    Colon cancer Neg Hx    Prostate cancer Neg Hx    Diabetes Neg Hx    Hypertension Neg Hx    Hypercholesterolemia Neg Hx     Social History: I have reviewed social history from progress note on 09/29/2020. Social History   Socioeconomic History   Marital status: Divorced    Spouse name: Not on file   Number of children: 1   Years of education: Vocational   Highest education level: Not on file  Occupational History   Occupation: IT    Comment: Commuting to [employer]  Tobacco Use   Smoking status: Every Day    Current packs/day: 1.00    Average packs/day: 1 pack/day for 17.1 years (17.1 ttl pk-yrs)    Types: Cigarettes    Start date: 10/10/2001    Last attempt to quit: 10/10/2016   Smokeless tobacco: Former   Tobacco comments:    Quit using vaping  Vaping Use   Vaping status: Former   Quit date: 04/14/2018  Substance and Sexual Activity   Alcohol use: No   Drug use: No   Sexual activity: Yes    Birth control/protection: Surgical    Comment: Vasectomy 2013  Other Topics Concern   Not on file  Social History Narrative   Not on file   Social Drivers of Health   Financial Resource Strain: Not on file  Food Insecurity: Not on file  Transportation Needs: Not on file  Physical Activity: Not on file  Stress: Not on file  Social Connections: Not on file    Allergies: No Known Allergies  Metabolic Disorder Labs: Lab Results  Component Value Date   HGBA1C 5.9 (A) 02/02/2023   MPG 128 08/09/2022   MPG 123 07/01/2021   Lab Results  Component Value Date   PROLACTIN 8.9 05/16/2018   Lab Results  Component Value Date   CHOL 170 08/09/2022   TRIG 203 (H) 08/09/2022   HDL 48  08/09/2022   CHOLHDL 3.5 08/09/2022   LDLCALC 93 08/09/2022   LDLCALC 111 (H) 07/01/2021   Lab Results  Component Value Date   TSH 1.26 08/09/2022   TSH 1.91 07/01/2021    Therapeutic Level Labs: No results found for: "LITHIUM" No results found for: "VALPROATE" No results found for: "CBMZ"  Current Medications: Current Outpatient Medications  Medication Sig Dispense Refill   amoxicillin (AMOXIL) 500 MG tablet Take 500 mg by mouth 3 (three) times daily.     chlorhexidine (PERIDEX) 0.12 % solution SMARTSIG:By Mouth     clomiPHENE (CLOMID) 50 MG tablet TAKE 1/2 TABLET(25 MG) BY MOUTH DAILY 30 tablet 5   clonazePAM (KLONOPIN) 0.5 MG tablet Take 0.5-1 tablets (0.25-0.5 mg total) by mouth daily as needed for anxiety. Please limit use 15 tablet 0   doxepin (SINEQUAN) 10 MG capsule Take  10-20 mg by mouth at bedtime as needed.     hydrOXYzine (VISTARIL) 25 MG capsule Take 1-2 capsules (25-50 mg total) by mouth at bedtime as needed for anxiety (sleep). 60 capsule 2   metoprolol succinate (TOPROL-XL) 25 MG 24 hr tablet TAKE 1 TABLET(25 MG) BY MOUTH DAILY 90 tablet 3   NEEDLE, DISP, 18 G (BD DISP NEEDLES) 18G X 1-1/2" MISC Use 18G to draw up Testosterone medication 50 each 0   NEEDLE, DISP, 21 G (BD ECLIPSE NEEDLE) 21G X 1-1/2" MISC Use 21G needle to inject Testosterone medication. 50 each 0   Syringe, Disposable, (2-3CC SYRINGE) 3 ML MISC Use as directed 100 each 0   Testosterone 20.25 MG/ACT (1.62%) GEL 1 pump applied to each shoulder daily 75 g 1   varenicline (CHANTIX) 0.5 MG tablet Take 1 tablet (0.5 mg total) by mouth 2 (two) times daily. 60 tablet 1   venlafaxine XR (EFFEXOR XR) 150 MG 24 hr capsule Take 1 capsule (150 mg total) by mouth daily with breakfast. 90 capsule 1   HYDROcodone-acetaminophen (NORCO/VICODIN) 5-325 MG tablet Take 1 tablet by mouth every 6 (six) hours as needed. (Patient not taking: Reported on 07/09/2023)     oxyCODONE-acetaminophen (PERCOCET/ROXICET) 5-325 MG tablet  Take 1 tablet by mouth 3 (three) times daily as needed. (Patient not taking: Reported on 07/09/2023)     No current facility-administered medications for this visit.     Musculoskeletal: Strength & Muscle Tone:  UTA Gait & Station:  Seated Patient leans: N/A  Psychiatric Specialty Exam: Review of Systems  Psychiatric/Behavioral:  The patient is nervous/anxious.     There were no vitals taken for this visit.There is no height or weight on file to calculate BMI.  General Appearance: Fairly Groomed  Eye Contact:  Fair  Speech:  Normal Rate  Volume:  Normal  Mood:  Anxious  Affect:  Congruent  Thought Process:  Goal Directed and Descriptions of Associations: Intact  Orientation:  Full (Time, Place, and Person)  Thought Content: Logical   Suicidal Thoughts:  No  Homicidal Thoughts:  No  Memory:  Immediate;   Fair Recent;   Fair Remote;   Fair  Judgement:  Fair  Insight:  Fair  Psychomotor Activity:  Normal  Concentration:  Concentration: Fair and Attention Span: Fair  Recall:  [employer] of Knowledge: Fair  Language: Fair  Akathisia:  No  Handed:  Right  AIMS (if indicated): not done  Assets:  Desire for Improvement Housing Social Support  ADL's:  Intact  Cognition: WNL  Sleep:  Fair   Screenings: AIMS    Flowsheet Row Video Visit from 11/23/2021 in [facility]  AIMS Total Score 0      GAD-7    Flowsheet Row Office Visit from 05/02/2023 in [facility] Office Visit from 02/02/2023 in [facility] Video Visit from 10/16/2022 in [facility] Office Visit from 08/04/2022 in [facility] Office Visit from 04/12/2022 in [facility]  Total GAD-7 Score 10 8 9 6 6       PHQ2-9    Flowsheet Row Office Visit from 05/02/2023 in [facility] Video Visit from 02/19/2023 in [facility] Office Visit from 02/02/2023 in [facility] Video Visit from 01/02/2023 in [facility] Video Visit from  10/16/2022 in [facility]  PHQ-2 Total Score 2 1 3 3 1   PHQ-9 Total Score 10 -- 9 5 --      Flowsheet Row Video Visit from 07/09/2023 in [facility] Office Visit from 05/02/2023 in [facility] Video Visit from 02/19/2023 in [facility]  C-SSRS RISK CATEGORY No Risk No Risk No Risk        Assessment and Plan: Demarius Archila is a 43 year old Caucasian male with a history of GAD, depression was evaluated by telemedicine today.  Patient with situational anxiety as well as relapse on smoking, discussed assessment and plan as noted below.  Generalized Anxiety Disorder-stable Vannie experiences anxiety, particularly related to medical procedures. He was given an unspecified medication before dental surgery, which initially caused discomfort but eventually helped him relax. He is hesitant to use clonazepam due to concerns about side effects and addiction.   - Continue venlafaxine 150 mg daily   - Consider clonazepam 0.25-0.5 mg daily as needed for acute anxiety, starting with a half tablet to assess tolerance    Sleep Disturbances   Freedom uses hydroxyzine and doxepin for sleep, with hydroxyzine being used more frequently. He has not used Ambien recently and prefers non-pharmacological methods like meditation for sleep.   - Continue hydroxyzine 25 to 50 mg as needed for sleep   - Continue doxepin 10 to 20 mg at bedtime as needed.  Rarely uses it. - Discontinue Ambien for noncompliance. - Consider non-pharmacological methods like meditation for sleep    Smoking  Cessation-unstable Mylez has resumed smoking, currently smoking close to a pack a day since late 2023. He is interested in using varenicline (Chantix) to aid in smoking cessation. He was informed about potential side effects, including the need to avoid alcohol and the risk of lowering seizure threshold.   - Prescribe varenicline 0.5 mg twice daily, starting with once daily for the first week   - Advise to avoid alcohol while taking varenicline   - Monitor for side effects and report if any occur    Other specified depressive disorder-depression episode with insufficient symptoms-stable Currently denies any depression symptoms, managed on venlafaxine. - Continue venlafaxine extended release 150 mg daily    Follow-up   - Follow up with primary care provider for weight management and smoking cessation   - Follow up with psychiatrist on March 24th at 4 PM by video.   Collaboration of Care: Collaboration of Care: Primary Care Provider AEB patient encouraged to follow up with primary care provider.  Patient/Guardian was advised Release of Information must be obtained prior to any record release in order to collaborate their care with an outside provider. Patient/Guardian was advised if they have not already done so to contact the registration department to sign all necessary forms in order for [place] to release information regarding their care.   Consent: Patient/Guardian gives verbal consent for treatment and assignment of benefits for services provided during this visit. Patient/Guardian expressed understanding and agreed to proceed.   This note was generated in part or whole with voice recognition software. Voice recognition is usually quite accurate but there are transcription errors that can and very often do occur. I apologize for any typographical errors that were not detected and corrected.    Jomarie Longs, MD 07/09/2023, 4:51 PM

## 2023-07-09 NOTE — Patient Instructions (Signed)
Varenicline Tablets What is this medication? VARENICLINE (var e NI kleen) helps you quit smoking. It reduces cravings for nicotine, the addictive substance found in tobacco. It is most effective when used in combination with a stop-smoking program. This medicine may be used for other purposes; ask your health care provider or pharmacist if you have questions. COMMON BRAND NAME(S): Chantix What should I tell my care team before I take this medication? They need to know if you have any of these conditions: Heart disease Frequently drink alcohol Kidney disease Mental health condition On hemodialysis Seizures History of stroke Suicidal thoughts, plans, or attempt by you or a family member An unusual or allergic reaction to varenicline, other medications, foods, dyes, or preservatives Pregnant or trying to get pregnant Breast-feeding How should I use this medication? Take this medication by mouth after eating. Take with a full glass of water. Follow the directions on the prescription label. Take your doses at regular intervals. Do not take your medication more often than directed. There are 3 ways you can use this medication to help you quit smoking; talk to your care team to decide which plan is right for you: 1) you can choose a quit date and start this medication 1 week before the quit date, or, 2) you can start taking this medication before you choose a quit date, and then pick a quit date between day 8 and 35 days of treatment, or, 3) if you are not sure that you are able or willing to quit smoking right away, start taking this medication and slowly decrease the amount you smoke as directed by your care team with the goal of being cigarette-free by week 12 of treatment. Stick to your plan; ask about support groups or other ways to help you remain cigarette-free. If you are motivated to quit smoking and did not succeed during a previous attempt with this medication for reasons other than side  effects, or if you returned to smoking after this treatment, speak with your care team about whether another course of this medication may be right for you. A special MedGuide will be given to you by the pharmacist with each prescription and refill. Be sure to read this information carefully each time. Talk to your care team about the use of this medication in children. This medication is not approved for use in children. Overdosage: If you think you have taken too much of this medicine contact a poison control center or emergency room at once. NOTE: This medicine is only for you. Do not share this medicine with others. What if I miss a dose? If you miss a dose, take it as soon as you can. If it is almost time for your next dose, take only that dose. Do not take double or extra doses. What may interact with this medication? Alcohol Insulin Other medications used to help people quit smoking Theophylline Warfarin This list may not describe all possible interactions. Give your health care provider a list of all the medicines, herbs, non-prescription drugs, or dietary supplements you use. Also tell them if you smoke, drink alcohol, or use illegal drugs. Some items may interact with your medicine. What should I watch for while using this medication? It is okay if you do not succeed at your attempt to quit and have a cigarette. You can still continue your quit attempt and keep using this medication as directed. Just throw away your cigarettes and get back to your quit plan. Talk to your care team  before using other treatments to quit smoking. Using this medication with other treatments to quit smoking may increase the risk for side effects compared to using a treatment alone. This medication may affect your coordination, reaction time, or judgment. Do not drive or operate machinery until you know how this medication affects you. Sit up or stand slowly to reduce the risk of dizzy or fainting  spells. Decrease the number of alcoholic beverages that you drink during treatment with this medication until you know if this medication affects your ability to tolerate alcohol. Some people have experienced increased drunkenness (intoxication), unusual or sometimes aggressive behavior, or no memory of things that have happened (amnesia) during treatment with this medication. You may do unusual sleep behaviors or activities you do not remember the day after taking this medication. Activities include driving, making or eating food, talking on the phone, sexual activity, or sleep walking. Stop taking this medication and call your care team right away if you find out you have done activities like this. Patients and their families should watch out for new or worsening depression or thoughts of suicide. Also watch out for sudden changes in feelings such as feeling anxious, agitated, panicky, irritable, hostile, aggressive, impulsive, severely restless, overly excited and hyperactive, or not being able to sleep. If this happens, call your care team. If you have diabetes, and you quit smoking, the effects of insulin may be increased. You may need to reduce your insulin dose. Check with your care team about how you should adjust your insulin dose. What side effects may I notice from receiving this medication? Side effects that you should report to your care team as soon as possible: Allergic reactions or angioedema--skin rash, itching or hives, swelling of the face, eyes, lips, tongue, arms, or legs, trouble swallowing or breathing Heart attack--pain or tightness in the chest, shoulders, arms, or jaw, nausea, shortness of breath, cold or clammy skin, feeling faint or lightheaded Mood and behavior changes--anxiety, nervousness, confusion, hallucinations, irritability, hostility, thoughts of suicide or self-harm, worsening mood, feelings of depression Redness, blistering, peeling, or loosening of the skin,  including inside the mouth Stroke--sudden numbness or weakness of the face, arm, or leg, trouble speaking, confusion, trouble walking, loss of balance or coordination, dizziness, severe headache, change in vision Seizures Side effects that usually do not require medical attention (report to your care team if they continue or are bothersome): Constipation Drowsiness Gas Nausea Trouble sleeping Upset stomach Vivid dreams or nightmares Vomiting This list may not describe all possible side effects. Call your doctor for medical advice about side effects. You may report side effects to FDA at 1-800-FDA-1088. Where should I keep my medication? Keep out of the reach of children and pets. Store at room temperature between 15 and 30 degrees C (59 and 86 degrees F). Throw away any unused medication after the expiration date. NOTE: This sheet is a summary. It may not cover all possible information. If you have questions about this medicine, talk to your doctor, pharmacist, or health care provider.  2024 Elsevier/Gold Standard (2021-04-21 00:00:00)

## 2023-07-31 ENCOUNTER — Other Ambulatory Visit: Payer: Self-pay | Admitting: *Deleted

## 2023-07-31 DIAGNOSIS — E291 Testicular hypofunction: Secondary | ICD-10-CM

## 2023-08-01 ENCOUNTER — Other Ambulatory Visit: Payer: BC Managed Care – PPO

## 2023-08-01 DIAGNOSIS — E291 Testicular hypofunction: Secondary | ICD-10-CM

## 2023-08-02 ENCOUNTER — Encounter: Payer: Self-pay | Admitting: Urology

## 2023-08-02 DIAGNOSIS — G4733 Obstructive sleep apnea (adult) (pediatric): Secondary | ICD-10-CM | POA: Diagnosis not present

## 2023-08-02 LAB — TESTOSTERONE: Testosterone: 281 ng/dL (ref 264–916)

## 2023-08-06 ENCOUNTER — Other Ambulatory Visit: Payer: BC Managed Care – PPO

## 2023-08-06 DIAGNOSIS — Z0000 Encounter for general adult medical examination without abnormal findings: Secondary | ICD-10-CM

## 2023-08-06 DIAGNOSIS — E781 Pure hyperglyceridemia: Secondary | ICD-10-CM

## 2023-08-06 DIAGNOSIS — R7303 Prediabetes: Secondary | ICD-10-CM | POA: Diagnosis not present

## 2023-08-06 DIAGNOSIS — R7309 Other abnormal glucose: Secondary | ICD-10-CM

## 2023-08-06 DIAGNOSIS — Z125 Encounter for screening for malignant neoplasm of prostate: Secondary | ICD-10-CM

## 2023-08-07 LAB — COMPLETE METABOLIC PANEL WITH GFR
AG Ratio: 1.8 (calc) (ref 1.0–2.5)
ALT: 36 U/L (ref 9–46)
AST: 21 U/L (ref 10–40)
Albumin: 4.6 g/dL (ref 3.6–5.1)
Alkaline phosphatase (APISO): 60 U/L (ref 36–130)
BUN: 17 mg/dL (ref 7–25)
CO2: 28 mmol/L (ref 20–32)
Calcium: 9.9 mg/dL (ref 8.6–10.3)
Chloride: 101 mmol/L (ref 98–110)
Creat: 1.27 mg/dL (ref 0.60–1.29)
Globulin: 2.5 g/dL (ref 1.9–3.7)
Glucose, Bld: 199 mg/dL — ABNORMAL HIGH (ref 65–139)
Potassium: 3.9 mmol/L (ref 3.5–5.3)
Sodium: 139 mmol/L (ref 135–146)
Total Bilirubin: 0.4 mg/dL (ref 0.2–1.2)
Total Protein: 7.1 g/dL (ref 6.1–8.1)
eGFR: 73 mL/min/{1.73_m2} (ref >=60)

## 2023-08-07 LAB — CBC WITH DIFFERENTIAL/PLATELET
Absolute Lymphocytes: 1825 {cells}/uL (ref 850–3900)
Absolute Monocytes: 383 {cells}/uL (ref 200–950)
Basophils Absolute: 38 {cells}/uL (ref 0–200)
Basophils Relative: 0.7 %
Eosinophils Absolute: 351 {cells}/uL (ref 15–500)
Eosinophils Relative: 6.5 %
HCT: 47.8 % (ref 38.5–50.0)
Hemoglobin: 15.8 g/dL (ref 13.2–17.1)
MCH: 29.5 pg (ref 27.0–33.0)
MCHC: 33.1 g/dL (ref 32.0–36.0)
MCV: 89.3 fL (ref 80.0–100.0)
MPV: 10.3 fL (ref 7.5–12.5)
Monocytes Relative: 7.1 %
Neutro Abs: 2803 {cells}/uL (ref 1500–7800)
Neutrophils Relative %: 51.9 %
Platelets: 247 10*3/uL (ref 140–400)
RBC: 5.35 10*6/uL (ref 4.20–5.80)
RDW: 12.4 % (ref 11.0–15.0)
Total Lymphocyte: 33.8 %
WBC: 5.4 10*3/uL (ref 3.8–10.8)

## 2023-08-07 LAB — HEMOGLOBIN A1C
Hgb A1c MFr Bld: 6.4 %{Hb} — ABNORMAL HIGH (ref <5.7)
Mean Plasma Glucose: 137 mg/dL
eAG (mmol/L): 7.6 mmol/L

## 2023-08-07 LAB — LIPID PANEL
Cholesterol: 191 mg/dL (ref <200)
HDL: 34 mg/dL — ABNORMAL LOW (ref >=40)
LDL Cholesterol (Calc): 107 mg/dL — ABNORMAL HIGH
Non-HDL Cholesterol (Calc): 157 mg/dL — ABNORMAL HIGH (ref <130)
Total CHOL/HDL Ratio: 5.6 (calc) — ABNORMAL HIGH (ref <5.0)
Triglycerides: 374 mg/dL — ABNORMAL HIGH (ref <150)

## 2023-08-07 LAB — PSA: PSA: 0.72 ng/mL (ref <=4.00)

## 2023-08-07 LAB — TSH: TSH: 1.42 m[IU]/L (ref 0.40–4.50)

## 2023-08-09 ENCOUNTER — Encounter: Payer: Self-pay | Admitting: Family Medicine

## 2023-08-13 ENCOUNTER — Encounter: Payer: Self-pay | Admitting: Family Medicine

## 2023-08-13 ENCOUNTER — Ambulatory Visit: Payer: BC Managed Care – PPO | Admitting: Family Medicine

## 2023-08-13 VITALS — BP 110/68 | HR 108 | Ht 66.0 in | Wt 248.0 lb

## 2023-08-13 DIAGNOSIS — E291 Testicular hypofunction: Secondary | ICD-10-CM

## 2023-08-13 DIAGNOSIS — R7303 Prediabetes: Secondary | ICD-10-CM | POA: Diagnosis not present

## 2023-08-13 DIAGNOSIS — Z0000 Encounter for general adult medical examination without abnormal findings: Secondary | ICD-10-CM | POA: Diagnosis not present

## 2023-08-13 DIAGNOSIS — R7309 Other abnormal glucose: Secondary | ICD-10-CM | POA: Diagnosis not present

## 2023-08-13 DIAGNOSIS — E781 Pure hyperglyceridemia: Secondary | ICD-10-CM

## 2023-08-13 NOTE — Patient Instructions (Addendum)
 Thank you for coming to the office today.  Recent Labs    02/02/23 1533 08/06/23 0903  HGBA1C 5.9* 6.4*    For Weight Loss / Obesity only  Zepbound TO Check Cost & Coverage of Zepbound Please contact Research officer, trade union (manufacturer for [employer]) 1-800-LillyRx 602-269-4025) - Live agent to discuss cost and coverage.   QMVHQI Verified [email or web address]      Alternative options   Semaglutide injection (mixed Ozempic) from [facility] 0.25mg  weekly for 4 weeks then increase to 0.5mg  weekly It comes in a vial and a needle syringe, you need to draw up the shot and self admin it weekly Cost is about $200 per month Call them to check pricing and availability   [facility] Address: [address] [place], [place], [place] [address] Phone: [phone]   -----------------   [email or web address]   [email or web address]   Materials engineer Team With insurance $79/mo + copay for medications Without insurance $79/mo + $99/mo to include medication (Semaglutide) Without insurance $79/mo + $199/mo to include medication (Tirzepatide) [email or web address]   Please schedule a Follow-up Appointment to: Return in about 6 months (around 02/13/2024) for 6 month PreDM A1c.  If you have any other questions or concerns, please feel free to call the office or send a message through MyChart. You may also schedule an earlier appointment if necessary.  Additionally, you may be receiving a survey about your experience at our office within a few days to 1 week by e-mail or mail. We value your feedback.  Saralyn Pilar, DO [facility], [place]

## 2023-08-13 NOTE — Progress Notes (Signed)
 Subjective:    Patient ID: Robert [name], male    DOB: December 21, 1980, 43 y.o.   MRN: [id number]  Robert [name] is a 43 y.o. male presenting on 08/13/2023 for Annual Exam   HPI  Discussed the use of AI scribe software for clinical note transcription with the patient, who gave verbal consent to proceed.  History of Present Illness   [name] is a 43 year old male who presents for a yearly physical exam.  Morbid Obesity BMI >40 Pre-Diabetes Hyperlipidemia  He is concerned about his blood sugar levels, which have been rising. His recent A1c was 6.4, the highest it has been, compared to a previous 6.1. He did not fast before the glucose test, which may affect the results, but the A1c is considered reliable. He has been using artificial sweeteners in place of sugar but suspects he may be consuming more carbohydrates than intended. He has tried various diets, including keto, with limited success in lowering his A1c after efforts >6 months for diet and exercise.  He is also concerned about his weight, which he believes is contributing to his elevated blood sugar levels. He has experienced difficulty losing weight in recent years, despite previous success with weight loss strategies. He has tried different diets, including a shake plan and keto, but has not seen significant results recently.  He reports symptoms of tachycardia and elevated blood pressure, which he attributes to an overactive sympathetic nervous system. He experiences symptoms such as difficulty relaxing his shoulders and changes in ear sensation when standing up, which he believes are related to blood flow issues. He is currently taking venlafaxine, which helps with some symptoms but not with the tachycardia or blood pressure.  He has a history of elevated hematocrit levels, previously attributed to testosterone treatments. Currently, he is using a topical testosterone gel, which he finds provides a more stable effect compared to  injections. He reports feeling better overall with higher testosterone levels but has not achieved the desired increase with the gel yet. He is applying it consistently in the morning.  His recent lab results show normal kidney and liver function, normal blood counts, and normal PSA and thyroid levels.     OSA on CPAP - Patient reports prior history of dx OSA and on CPAP - Today reports that sleep apnea is well controlled. He uses the CPAP machine every night. Tolerates the machine well, and thinks that sleeps better with it and feels good. No new concerns or symptoms.        08/13/2023    2:54 PM 05/02/2023    3:05 PM 02/19/2023    2:36 PM  Depression screen PHQ 2/9  Decreased Interest 1    Down, Depressed, Hopeless 1    PHQ - 2 Score 2    Altered sleeping 1    Tired, decreased energy 2    Change in appetite 1    Feeling bad or failure about yourself  0    Trouble concentrating 0    Moving slowly or fidgety/restless 0    Suicidal thoughts 0    PHQ-9 Score 6    Difficult doing work/chores Somewhat difficult       Information is confidential and restricted. Go to Review Flowsheets to unlock data.       08/13/2023    2:54 PM 05/02/2023    3:06 PM 02/02/2023    3:27 PM 10/16/2022    4:11 PM  GAD 7 : Generalized Anxiety Score  Nervous, Anxious,  on Edge 3  2   Control/stop worrying 2  0   Worry too much - different things 2  2   Trouble relaxing 3  3   Restless 0  0   Easily annoyed or irritable 1  1   Afraid - awful might happen 1  0   Total GAD 7 Score 12  8   Anxiety Difficulty   Not difficult at all      Information is confidential and restricted. Go to Review Flowsheets to unlock data.     Past Medical History:  Diagnosis Date   ADHD    Anxiety    GERD (gastroesophageal reflux disease)    Wears contact lenses    Past Surgical History:  Procedure Laterality Date   ESOPHAGOGASTRODUODENOSCOPY (EGD) WITH PROPOFOL N/A 06/14/2017   Procedure: ESOPHAGOGASTRODUODENOSCOPY  (EGD) WITH PROPOFOL;  Surgeon: Midge Minium, MD;  Location: [facility];  Service: Endoscopy;  Laterality: N/A;   SEPTOPLASTY  06/13/2011   WISDOM TOOTH EXTRACTION     Social History   Socioeconomic History   Marital status: Divorced    Spouse name: Not on file   Number of children: 1   Years of education: Vocational   Highest education level: Associate degree: occupational, Scientist, product/process development, or vocational program  Occupational History   Occupation: IT    Comment: Commuting to [employer]  Tobacco Use   Smoking status: Every Day    Current packs/day: 1.00    Average packs/day: 1 pack/day for 17.2 years (17.2 ttl pk-yrs)    Types: Cigarettes    Start date: 10/10/2001    Last attempt to quit: 10/10/2016   Smokeless tobacco: Former   Tobacco comments:    Quit using vaping  Vaping Use   Vaping status: Former   Quit date: 04/14/2018  Substance and Sexual Activity   Alcohol use: No   Drug use: No   Sexual activity: Yes    Birth control/protection: Surgical    Comment: Vasectomy 2013  Other Topics Concern   Not on file  Social History Narrative   Not on file   Social Drivers of Health   Financial Resource Strain: Low Risk  (08/10/2023)   Overall Financial Resource Strain (CARDIA)    Difficulty of Paying Living Expenses: Not very hard  Food Insecurity: Unknown (08/10/2023)   Hunger Vital Sign    Worried About Running Out of Food in the Last Year: Never true    Ran Out of Food in the Last Year: Patient declined  Transportation Needs: No Transportation Needs (08/10/2023)   PRAPARE - Administrator, Civil Service (Medical): No    Lack of Transportation (Non-Medical): No  Physical Activity: Insufficiently Active (08/10/2023)   Exercise Vital Sign    Days of Exercise per Week: 2 days    Minutes of Exercise per Session: 20 min  Stress: Stress Concern Present (08/10/2023)   [employer] of Occupational Health - Occupational Stress Questionnaire    Feeling of Stress :  Rather much  Social Connections: Unknown (08/10/2023)   Social Connection and Isolation Panel [NHANES]    Frequency of Communication with Friends and Family: More than three times a week    Frequency of Social Gatherings with Friends and Family: Three times a week    Attends Religious Services: Patient declined    Active Member of Clubs or Organizations: Yes    Attends Banker Meetings: 1 to 4 times per year    Marital Status: Divorced  Intimate  Partner Violence: Not on file   Family History  Problem Relation Age of Onset   Depression Mother    Suicidality Mother    Cancer Maternal Grandfather 59       stomach   Esophageal cancer Maternal Grandfather 38   Depression Sister    Colon cancer Neg Hx    Prostate cancer Neg Hx    Diabetes Neg Hx    Hypertension Neg Hx    Hypercholesterolemia Neg Hx    Current Outpatient Medications on File Prior to Visit  Medication Sig   clonazePAM (KLONOPIN) 0.5 MG tablet Take 0.5-1 tablets (0.25-0.5 mg total) by mouth daily as needed for anxiety. Please limit use   doxepin (SINEQUAN) 10 MG capsule Take 10-20 mg by mouth at bedtime as needed.   hydrOXYzine (VISTARIL) 25 MG capsule Take 1-2 capsules (25-50 mg total) by mouth at bedtime as needed for anxiety (sleep).   metoprolol succinate (TOPROL-XL) 25 MG 24 hr tablet TAKE 1 TABLET(25 MG) BY MOUTH DAILY   NEEDLE, DISP, 18 G (BD DISP NEEDLES) 18G X 1-1/2" MISC Use 18G to draw up Testosterone medication   NEEDLE, DISP, 21 G (BD ECLIPSE NEEDLE) 21G X 1-1/2" MISC Use 21G needle to inject Testosterone medication.   Syringe, Disposable, (2-3CC SYRINGE) 3 ML MISC Use as directed   Testosterone 20.25 MG/ACT (1.62%) GEL 1 pump applied to each shoulder daily   varenicline (CHANTIX) 0.5 MG tablet Take 1 tablet (0.5 mg total) by mouth 2 (two) times daily.   venlafaxine XR (EFFEXOR XR) 150 MG 24 hr capsule Take 1 capsule (150 mg total) by mouth daily with breakfast.   clomiPHENE (CLOMID) 50 MG tablet  TAKE 1/2 TABLET(25 MG) BY MOUTH DAILY   No current facility-administered medications on file prior to visit.    Review of Systems  Constitutional:  Negative for activity change, appetite change, chills, diaphoresis, fatigue and fever.  HENT:  Negative for congestion and hearing loss.   Eyes:  Negative for visual disturbance.  Respiratory:  Negative for cough, chest tightness, shortness of breath and wheezing.   Cardiovascular:  Negative for chest pain, palpitations and leg swelling.  Gastrointestinal:  Negative for abdominal pain, constipation, diarrhea, nausea and vomiting.  Genitourinary:  Negative for dysuria, frequency and hematuria.  Musculoskeletal:  Negative for arthralgias and neck pain.  Skin:  Negative for rash.  Neurological:  Negative for dizziness, weakness, light-headedness, numbness and headaches.  Hematological:  Negative for adenopathy.  Psychiatric/Behavioral:  Negative for behavioral problems, dysphoric mood and sleep disturbance.    Per HPI unless specifically indicated above     Objective:    BP 110/68   Pulse (!) 108   Ht 5\' 6"  (1.676 m)   Wt 248 lb (112.5 kg)   SpO2 96%   BMI 40.03 kg/m   Wt Readings from Last 3 Encounters:  08/13/23 248 lb (112.5 kg)  02/02/23 246 lb 12.8 oz (111.9 kg)  09/15/22 220 lb (99.8 kg)    Physical Exam Vitals and nursing note reviewed.  Constitutional:      General: He is not in acute distress.    Appearance: He is well-developed. He is obese. He is not diaphoretic.     Comments: Well-appearing, comfortable, cooperative  HENT:     Head: Normocephalic and atraumatic.  Eyes:     General:        Right eye: No discharge.        Left eye: No discharge.     Conjunctiva/sclera: Conjunctivae normal.  Pupils: Pupils are equal, round, and reactive to light.  Neck:     Thyroid: No thyromegaly.     Vascular: No carotid bruit.  Cardiovascular:     Rate and Rhythm: Normal rate and regular rhythm.     Pulses: Normal pulses.      Heart sounds: Normal heart sounds. No murmur heard. Pulmonary:     Effort: Pulmonary effort is normal. No respiratory distress.     Breath sounds: Normal breath sounds. No wheezing or rales.  Abdominal:     General: Bowel sounds are normal. There is no distension.     Palpations: Abdomen is soft. There is no mass.     Tenderness: There is no abdominal tenderness.  Musculoskeletal:        General: No tenderness. Normal range of motion.     Cervical back: Normal range of motion and neck supple.     Right lower leg: No edema.     Left lower leg: No edema.     Comments: Upper / Lower Extremities: - Normal muscle tone, strength bilateral upper extremities 5/5, lower extremities 5/5  Lymphadenopathy:     Cervical: No cervical adenopathy.  Skin:    General: Skin is warm and dry.     Findings: No erythema or rash.  Neurological:     Mental Status: He is alert and oriented to person, place, and time.     Comments: Distal sensation intact to light touch all extremities  Psychiatric:        Mood and Affect: Mood normal.        Behavior: Behavior normal.        Thought Content: Thought content normal.     Comments: Well groomed, good eye contact, normal speech and thoughts     Results for orders placed or performed in visit on 08/06/23  TSH   Collection Time: 08/06/23  9:03 AM  Result Value Ref Range   TSH 1.42 0.40 - 4.50 mIU/L  PSA   Collection Time: 08/06/23  9:03 AM  Result Value Ref Range   PSA 0.72 < OR = 4.00 ng/mL  CBC with Differential/Platelet   Collection Time: 08/06/23  9:03 AM  Result Value Ref Range   WBC 5.4 3.8 - 10.8 Thousand/uL   RBC 5.35 4.20 - 5.80 Million/uL   Hemoglobin 15.8 13.2 - 17.1 g/dL   HCT 40.9 81.1 - 91.4 %   MCV 89.3 80.0 - 100.0 fL   MCH 29.5 27.0 - 33.0 pg   MCHC 33.1 32.0 - 36.0 g/dL   RDW 78.2 95.6 - 21.3 %   Platelets 247 140 - 400 Thousand/uL   MPV 10.3 7.5 - 12.5 fL   Neutro Abs 2,803 1,500 - 7,800 cells/uL   Absolute Lymphocytes  1,825 850 - 3,900 cells/uL   Absolute Monocytes 383 200 - 950 cells/uL   Eosinophils Absolute 351 15 - 500 cells/uL   Basophils Absolute 38 0 - 200 cells/uL   Neutrophils Relative % 51.9 %   Total Lymphocyte 33.8 %   Monocytes Relative 7.1 %   Eosinophils Relative 6.5 %   Basophils Relative 0.7 %  COMPLETE METABOLIC PANEL WITH GFR   Collection Time: 08/06/23  9:03 AM  Result Value Ref Range   Glucose, Bld 199 (H) 65 - 139 mg/dL   BUN 17 7 - 25 mg/dL   Creat 0.86 5.78 - 4.69 mg/dL   eGFR 73 > OR = 60 GE/XBM/8.41L2   BUN/Creatinine Ratio SEE NOTE: 6 - 22 (  calc)   Sodium 139 135 - 146 mmol/L   Potassium 3.9 3.5 - 5.3 mmol/L   Chloride 101 98 - 110 mmol/L   CO2 28 20 - 32 mmol/L   Calcium 9.9 8.6 - 10.3 mg/dL   Total Protein 7.1 6.1 - 8.1 g/dL   Albumin 4.6 3.6 - 5.1 g/dL   Globulin 2.5 1.9 - 3.7 g/dL (calc)   AG Ratio 1.8 1.0 - 2.5 (calc)   Total Bilirubin 0.4 0.2 - 1.2 mg/dL   Alkaline phosphatase (APISO) 60 36 - 130 U/L   AST 21 10 - 40 U/L   ALT 36 9 - 46 U/L  Lipid panel   Collection Time: 08/06/23  9:03 AM  Result Value Ref Range   Cholesterol 191 <200 mg/dL   HDL 34 (L) > OR = 40 mg/dL   Triglycerides 332 (H) <150 mg/dL   LDL Cholesterol (Calc) 107 (H) mg/dL (calc)   Total CHOL/HDL Ratio 5.6 (H) <5.0 (calc)   Non-HDL Cholesterol (Calc) 157 (H) <130 mg/dL (calc)  Hemoglobin R5J   Collection Time: 08/06/23  9:03 AM  Result Value Ref Range   Hgb A1c MFr Bld 6.4 (H) <5.7 % of total Hgb   Mean Plasma Glucose 137 mg/dL   eAG (mmol/L) 7.6 mmol/L      Assessment & Plan:   Problem List Items Addressed This Visit     Elevated hemoglobin A1c   Hypogonadism in male   Morbid obesity (HCC)   Pre-diabetes   Other Visit Diagnoses       Annual physical exam    -  Primary     Hypertriglyceridemia            Updated Health Maintenance information Reviewed recent lab results with patient Encouraged improvement to lifestyle with diet and exercise Goal of weight  loss   Prediabetes Morbid Obesity BMI >40 At risk Diabetes. A1c increased to 6.4, highest recorded value. Discussed the importance of weight loss and exercise in managing prediabetes. Patient is considering GLP therapy for weight management. -Check insurance coverage for Wegovy / Zepbound / or compounding He has had weight gain despite structure exercise and diet regimen >6+ months, including Keto diet among other strategies -Continue to encourage lifestyle modifications including diet and exercise.  Testosterone Replacement Therapy Followed by Urology Patient is currently on topical testosterone and reports better tolerance compared to injections. Hematocrit is within normal range. -Continue current regimen of topical testosterone. -Continue to monitor hematocrit levels to assess for polycythemia.  Hypertension Patient reports high home readings and has symptoms suggestive of increased sympathetic tone. -Consider referral to a psychiatrist for management of anxiety, which may be contributing to hypertension. -Consider further cardiovascular screening in the future, such as a heart scan.  Routine Health Maintenance Kidney function, liver function, electrolytes, CBC, PSA, and thyroid function tests are all within normal limits. -Continue current management and routine monitoring of these parameters.         No orders of the defined types were placed in this encounter.   No orders of the defined types were placed in this encounter.    Follow up plan: Return in about 6 months (around 02/13/2024) for 6 month PreDM A1c.  Saralyn Pilar, DO [facility] 08/13/2023, 3:16 PM

## 2023-08-30 DIAGNOSIS — G4733 Obstructive sleep apnea (adult) (pediatric): Secondary | ICD-10-CM | POA: Diagnosis not present

## 2023-09-02 ENCOUNTER — Other Ambulatory Visit: Payer: Self-pay | Admitting: Urology

## 2023-09-03 ENCOUNTER — Encounter: Payer: Self-pay | Admitting: Psychiatry

## 2023-09-03 ENCOUNTER — Telehealth: Payer: PRIVATE HEALTH INSURANCE | Admitting: Psychiatry

## 2023-09-03 DIAGNOSIS — F17210 Nicotine dependence, cigarettes, uncomplicated: Secondary | ICD-10-CM

## 2023-09-03 DIAGNOSIS — F17200 Nicotine dependence, unspecified, uncomplicated: Secondary | ICD-10-CM

## 2023-09-03 DIAGNOSIS — F3289 Other specified depressive episodes: Secondary | ICD-10-CM | POA: Diagnosis not present

## 2023-09-03 DIAGNOSIS — F5101 Primary insomnia: Secondary | ICD-10-CM | POA: Diagnosis not present

## 2023-09-03 DIAGNOSIS — Z87898 Personal history of other specified conditions: Secondary | ICD-10-CM | POA: Diagnosis not present

## 2023-09-03 DIAGNOSIS — F411 Generalized anxiety disorder: Secondary | ICD-10-CM | POA: Diagnosis not present

## 2023-09-03 MED ORDER — VARENICLINE TARTRATE 0.5 MG PO TABS: 0.5000 mg | ORAL_TABLET | Freq: Two times a day (BID) | ORAL | 3 refills | Status: DC

## 2023-09-03 NOTE — Progress Notes (Unsigned)
 Virtual Visit via Video Note  I connected with Robert [name] on 09/03/23 at  4:00 PM EDT by a video enabled telemedicine application and verified that I am speaking with the correct person using two identifiers.  Location Provider Location : [facility] Patient Location : Home  Participants: Patient , Provider    I discussed the limitations of evaluation and management by telemedicine and the availability of in person appointments. The patient expressed understanding and agreed to proceed.   I discussed the assessment and treatment plan with the patient. The patient was provided an opportunity to ask questions and all were answered. The patient agreed with the plan and demonstrated an understanding of the instructions.   The patient was advised to call back or seek an in-person evaluation if the symptoms worsen or if the condition fails to improve as anticipated.   BH MD OP Progress Note  09/04/2023 3:38 PM Jamarcus Laduke  MRN:  [id number]  Chief Complaint:  Chief Complaint  Patient presents with   Follow-up   Depression   Anxiety   Medication Refill   HPI: Robert [name] is a 43 year old Caucasian male, lives in [place], employed, has a history of GAD, insomnia, Tourette's disorder, GERD was evaluated by telemedicine today.  He has a history of generalized anxiety and has been feeling better overall with no significant symptoms of depression recently. Anxiety increased over the holidays and after his grandmother's passing in February, but it has been more manageable in recent weeks. He is taking ashwagandha, which he feels has improved his mood, and has support from his mother, who is also grieving.  Denies thoughts of self-harm.  Reports appetite is stable.  He manages insomnia with doxepin as needed at bedtime and hydroxyzine as needed, which he used last night. Hydroxyzine is used occasionally, including two tablets the previous night.  He smokes cigarettes and has previously used  vaping. He is using Chantix to aid in smoking cessation, which helps him reduce smoking. He requires a refill of Chantix, taking two tablets a day.  He has started tirzepatide 2.5 mg weekly for blood sugar concerns noted during a physical in January. He has just begun this medication and has not noticed any changes. No behavioral changes or digestive issues since starting the medication.  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 He enjoys walking with his puppy, especially on nice days.  Currently denies any suicidality, homicidality or perceptual disturbances. Visit Diagnosis:    ICD-10-CM   1. GAD (generalized anxiety disorder)  F41.1     2. Primary insomnia  F51.01     3. Other specified depressive episodes  F32.89    Depressive episodes with insufficient symptoms    4. History of Gilles de la Tourette's syndrome  Z87.898     5. Tobacco use disorder  F17.200 varenicline (CHANTIX) 0.5 MG tablet      Past Psychiatric History: I have reviewed past psychiatric history from progress note on 09/29/2020.  Past trials of Lexapro, Wellbutrin, BuSpar, venlafaxine.  Past Medical History:  Past Medical History:  Diagnosis Date   ADHD    Anxiety    GERD (gastroesophageal reflux disease)    Wears contact lenses     Past Surgical History:  Procedure Laterality Date   ESOPHAGOGASTRODUODENOSCOPY (EGD) WITH PROPOFOL N/A 06/14/2017   Procedure: ESOPHAGOGASTRODUODENOSCOPY (EGD) WITH PROPOFOL;  Surgeon: Midge Minium, MD;  Location[facility];  Service: Endoscopy;  Laterality: N/A;  SEPTOPLASTY  06/13/2011   WISDOM TOOTH EXTRACTION      Family Psychiatric History: I have reviewed family psychiatric history from progress note on 09/29/2020.  Family History:  Family History  Problem Relation Age of Onset   Depression Mother    Suicidality Mother    Cancer Maternal Grandfather 19       stomach   Esophageal cancer Maternal  Grandfather 6   Depression Sister    Colon cancer Neg Hx    Prostate cancer Neg Hx    Diabetes Neg Hx    Hypertension Neg Hx    Hypercholesterolemia Neg Hx     Social History: I have reviewed social history from progress note on 09/29/2020. Social History   Socioeconomic History   Marital status: Divorced    Spouse name: Not on file   Number of children: 1   Years of education: Vocational   Highest education level: Associate degree: occupational, Scientist, product/process development, or vocational program  Occupational History   Occupation: IT    Comment: Commuting to [employer]  Tobacco Use   Smoking status: Every Day    Current packs/day: 1.00    Average packs/day: 1 pack/day for 17.2 years (17.2 ttl pk-yrs)    Types: Cigarettes    Start date: 10/10/2001    Last attempt to quit: 10/10/2016   Smokeless tobacco: Former   Tobacco comments:    Quit using vaping  Vaping Use   Vaping status: Former   Quit date: 04/14/2018  Substance and Sexual Activity   Alcohol use: No   Drug use: No   Sexual activity: Yes    Birth control/protection: Surgical    Comment: Vasectomy 2013  Other Topics Concern   Not on file  Social History Narrative   Not on file   Social Drivers of Health   Financial Resource Strain: Low Risk  (08/10/2023)   Overall Financial Resource Strain (CARDIA)    Difficulty of Paying Living Expenses: Not very hard  Food Insecurity: Unknown (08/10/2023)   Hunger Vital Sign    Worried About Running Out of Food in the Last Year: Never true    Ran Out of Food in the Last Year: Patient declined  Transportation Needs: No Transportation Needs (08/10/2023)   PRAPARE - Administrator, Civil Service (Medical): No    Lack of Transportation (Non-Medical): No  Physical Activity: Insufficiently Active (08/10/2023)   Exercise Vital Sign    Days of Exercise per Week: 2 days    Minutes of Exercise per Session: 20 min  Stress: Stress Concern Present (08/10/2023)   [employer] of  Occupational Health - Occupational Stress Questionnaire    Feeling of Stress : Rather much  Social Connections: Unknown (08/10/2023)   Social Connection and Isolation Panel [NHANES]    Frequency of Communication with Friends and Family: More than three times a week    Frequency of Social Gatherings with Friends and Family: Three times a week    Attends Religious Services: Patient declined    Active Member of Clubs or Organizations: Yes    Attends Banker Meetings: 1 to 4 times per year    Marital Status: Divorced    Allergies: No Known Allergies  Metabolic Disorder Labs: Lab Results  Component Value Date   HGBA1C 6.4 (H) 08/06/2023   MPG 137 08/06/2023   MPG 128 08/09/2022   Lab Results  Component Value Date   PROLACTIN 8.9 05/16/2018   Lab Results  Component Value Date   CHOL  191 08/06/2023   TRIG 374 (H) 08/06/2023   HDL 34 (L) 08/06/2023   CHOLHDL 5.6 (H) 08/06/2023   LDLCALC 107 (H) 08/06/2023   LDLCALC 93 08/09/2022   Lab Results  Component Value Date   TSH 1.42 08/06/2023   TSH 1.26 08/09/2022    Therapeutic Level Labs: No results found for: "LITHIUM" No results found for: "VALPROATE" No results found for: "CBMZ"  Current Medications: Current Outpatient Medications  Medication Sig Dispense Refill   clonazePAM (KLONOPIN) 0.5 MG tablet Take 0.5-1 tablets (0.25-0.5 mg total) by mouth daily as needed for anxiety. Please limit use 15 tablet 0   tirzepatide (ZEPBOUND) 2.5 MG/0.5ML injection vial Inject 2.5 mg into the skin.     doxepin (SINEQUAN) 10 MG capsule Take 10-20 mg by mouth at bedtime as needed.     hydrOXYzine (VISTARIL) 25 MG capsule Take 1-2 capsules (25-50 mg total) by mouth at bedtime as needed for anxiety (sleep). 60 capsule 2   metoprolol succinate (TOPROL-XL) 25 MG 24 hr tablet TAKE 1 TABLET(25 MG) BY MOUTH DAILY 90 tablet 3   NEEDLE, DISP, 18 G (BD DISP NEEDLES) 18G X 1-1/2" MISC Use 18G to draw up Testosterone medication 50 each 0    NEEDLE, DISP, 21 G (BD ECLIPSE NEEDLE) 21G X 1-1/2" MISC Use 21G needle to inject Testosterone medication. 50 each 0   Syringe, Disposable, (2-3CC SYRINGE) 3 ML MISC Use as directed 100 each 0   Testosterone 20.25 MG/ACT (1.62%) GEL 1 pump applied to each shoulder daily 75 g 1   varenicline (CHANTIX) 0.5 MG tablet Take 1 tablet (0.5 mg total) by mouth 2 (two) times daily. 60 tablet 3   venlafaxine XR (EFFEXOR XR) 150 MG 24 hr capsule Take 1 capsule (150 mg total) by mouth daily with breakfast. 90 capsule 1   No current facility-administered medications for this visit.     Musculoskeletal: Strength & Muscle Tone:  UTA Gait & Station:  Seated Patient leans: N/A  Psychiatric Specialty Exam: Review of Systems  Psychiatric/Behavioral:         Grief    There were no vitals taken for this visit.There is no height or weight on file to calculate BMI.  General Appearance: Casual  Eye Contact:  Fair  Speech:  Clear and Coherent  Volume:  Normal  Mood:   grief -coping well  Affect:  Congruent  Thought Process:  Goal Directed and Descriptions of Associations: Intact  Orientation:  Full (Time, Place, and Person)  Thought Content: Logical   Suicidal Thoughts:  No  Homicidal Thoughts:  No  Memory:  Immediate;   Fair Recent;   Fair Remote;   Fair  Judgement:  Fair  Insight:  Fair  Psychomotor Activity:  Normal  Concentration:  Concentration: Fair and Attention Span: Fair  Recall:  [employer] of Knowledge: Fair  Language: Fair  Akathisia:  No  Handed:  Right  AIMS (if indicated): not done  Assets:  Desire for Improvement Housing Social Support Transportation  ADL's:  Intact  Cognition: WNL  Sleep:  Fair   Screenings: AIMS    Flowsheet Row Video Visit from 11/23/2021 in [facility]  AIMS Total Score 0      GAD-7    Flowsheet Row Office Visit from 08/13/2023 in [facility] Office Visit from 05/02/2023 in [facility] Office Visit from 02/02/2023 in [facility] Video Visit from  10/16/2022 in [facility] Office Visit from 08/04/2022 in [facility]  Total GAD-7 Score 12 10 8 9 6       PHQ2-9    Flowsheet Row Office Visit from 08/13/2023 in [facility] Office Visit from 05/02/2023 in [facility] Video Visit from 02/19/2023 in [facility] Office Visit from 02/02/2023 in [facility] Video Visit from 01/02/2023 in [facility]  PHQ-2 Total Score 2 2 1 3 3   PHQ-9 Total Score 6 10 -- 9 5      Flowsheet Row Video Visit from 09/03/2023 in [facility] Video Visit from 07/09/2023 in [facility] Office Visit from 05/02/2023 in [facility]  C-SSRS RISK CATEGORY No Risk No Risk No Risk        Assessment and Plan: Triton Heidrich is a 43 year old Caucasian male, has a history of GAD, depression was evaluated by telemedicine today.  Discussed assessment and plan as noted below.   Generalized Anxiety Disorder-stable Generalized anxiety disorder is managed with venlafaxine 150 mg daily, doxepin as needed at bedtime, and hydroxyzine as needed. Reports improvement in anxiety symptoms, with a temporary increase due to bereavement in February. Coping well with grief and has family support. Ashwagandha use has contributed to overall well-being. - Continue Venlafaxine 150 mg daily - Continue Hydroxyzine 25 to 50 mg at bedtime as needed for anxiety/sleep - Continue  Clonazepam 0.5 mg as needed for severe anxiety attacks. - Encourage use of Ashwagandha as long as tolerated. - Provide support for coping  with grief  Depressive disorder-stable No current depressive symptoms, attributed to ashwagandha and venlafaxine 150 mg daily, which likely contributes to mood stability. - Continue Venlafaxine 150 mg daily  Insomnia-stable Insomnia managed with doxepin as needed at bedtime and hydroxyzine as needed. Reports occasional use of hydroxyzine, including two tablets the previous night. - Continue Doxepin 10 to 20 mg as needed at bedtime - Continue Hydroxyzine as needed, as noted above.  Tobacco Use Disorder-improving Using Chantix to aid smoking cessation, which is helpful in reducing cigarette use. Has not vaped recently and is focusing on gradual reduction of cigarette consumption. Informed about the importance of gradual reduction while on Chantix. - Continue Chantix 0.5 mg twice a day. - Encourage gradual reduction of cigarette use  Follow-up - Follow-up appointment scheduled for June 20th at 11:40 AM via video   Consent: Patient/Guardian gives verbal consent for treatment and assignment of benefits for services provided during this visit. Patient/Guardian expressed understanding and agreed to proceed.  Discussed the use of a AI scribe software for clinical note transcription with the patient, who gave verbal consent to proceed.  This note was generated in part or whole with voice recognition software. Voice recognition is usually quite accurate but there are transcription errors that can and very often do occur. I apologize for any typographical errors that were not detected and corrected.     Jomarie Longs, MD 09/04/2023, 3:38 PM

## 2023-09-04 NOTE — Telephone Encounter (Signed)
 Patient called to follow up on this medication refill, he is out of medication. Please review. Patient does have a follow up appointment in April and states somehow seems to run out each time before his appointment the amount he gets

## 2023-09-12 ENCOUNTER — Other Ambulatory Visit: Payer: Self-pay | Admitting: *Deleted

## 2023-09-12 DIAGNOSIS — E291 Testicular hypofunction: Secondary | ICD-10-CM

## 2023-09-12 DIAGNOSIS — Z125 Encounter for screening for malignant neoplasm of prostate: Secondary | ICD-10-CM

## 2023-09-13 ENCOUNTER — Other Ambulatory Visit: Payer: BC Managed Care – PPO

## 2023-09-13 DIAGNOSIS — Z125 Encounter for screening for malignant neoplasm of prostate: Secondary | ICD-10-CM

## 2023-09-13 DIAGNOSIS — E291 Testicular hypofunction: Secondary | ICD-10-CM

## 2023-09-14 ENCOUNTER — Ambulatory Visit: Payer: Self-pay | Admitting: Urology

## 2023-09-14 ENCOUNTER — Encounter: Payer: Self-pay | Admitting: Urology

## 2023-09-14 VITALS — BP 114/77 | HR 101

## 2023-09-14 DIAGNOSIS — E291 Testicular hypofunction: Secondary | ICD-10-CM | POA: Diagnosis not present

## 2023-09-14 LAB — HEMOGLOBIN AND HEMATOCRIT, BLOOD
Hematocrit: 50.5 % (ref 37.5–51.0)
Hemoglobin: 16.9 g/dL (ref 13.0–17.7)

## 2023-09-14 LAB — PSA: Prostate Specific Ag, Serum: 0.8 ng/mL (ref 0.0–4.0)

## 2023-09-14 LAB — TESTOSTERONE: Testosterone: 844 ng/dL (ref 264–916)

## 2023-09-14 MED ORDER — TESTOSTERONE 1.62 % TD GEL: TRANSDERMAL | 3 refills | Status: DC

## 2023-09-14 NOTE — Progress Notes (Signed)
 I, Robert [name], acting as a scribe for Robert Altes, MD., have documented all relevant documentation on the behalf of Robert Altes, MD, as directed by Robert Altes, MD while in the presence of Robert Altes, MD.  09/14/2023 2:57 PM   [name] [birth year] [phone]  Referring provider: Smitty Cords, DO [address] [place],  [place] [address]  Chief Complaint  Patient presents with   Follow-up   Urologic history: 1. Hypogonadism Trial Clomid 12/19; improved T but not symptoms TRT started 07/2018 Symptoms tiredness, fatigue, decreased libido Testosterone cypionate 200 mg; was discontinued due to concerns of erythrocytosis and elevated blood pressure. Started 1.62% testosterone gel January 2025  HPI: Robert [name] is a 43 y.o. male presents for annual follow-up  Since starting Clomid, he does have a good energy and libido. States improvement is not as significant as testosterone injections, but is satisfactory Labs 09/13/2023 testosterone 844, H/H 16.9/50.5, PSA 0.8  PSA trend  Prostate Specific Ag, Serum  Latest Ref Rng 0.0 - 4.0 ng/mL  01/23/2019 0.9   01/30/2020 0.8   09/09/2021 0.9   09/14/2022 0.7   09/13/2023 0.8      PMH: Past Medical History:  Diagnosis Date   ADHD    Anxiety    GERD (gastroesophageal reflux disease)    Wears contact lenses     Surgical History: Past Surgical History:  Procedure Laterality Date   ESOPHAGOGASTRODUODENOSCOPY (EGD) WITH PROPOFOL N/A 06/14/2017   Procedure: ESOPHAGOGASTRODUODENOSCOPY (EGD) WITH PROPOFOL;  Surgeon: Midge Minium, MD;  Location: [facility];  Service: Endoscopy;  Laterality: N/A;   SEPTOPLASTY  06/13/2011   WISDOM TOOTH EXTRACTION      Home Medications:  Allergies as of 09/14/2023   No Known Allergies      Medication List        Accurate as of September 14, 2023  2:57 PM. If you have any questions, ask your nurse or doctor.          2-3CC SYRINGE 3 ML Misc Use as directed    clonazePAM 0.5 MG tablet Commonly known as: KlonoPIN Take 0.5-1 tablets (0.25-0.5 mg total) by mouth daily as needed for anxiety. Please limit use   doxepin 10 MG capsule Commonly known as: SINEQUAN Take 10-20 mg by mouth at bedtime as needed.   hydrOXYzine 25 MG capsule Commonly known as: Vistaril Take 1-2 capsules (25-50 mg total) by mouth at bedtime as needed for anxiety (sleep).   metoprolol succinate 25 MG 24 hr tablet Commonly known as: TOPROL-XL TAKE 1 TABLET(25 MG) BY MOUTH DAILY   NEEDLE (DISP) 18 G 18G X 1-1/2" Misc Commonly known as: BD Disp Needles Use 18G to draw up Testosterone medication   NEEDLE (DISP) 21 G 21G X 1-1/2" Misc Commonly known as: BD Eclipse Needle Use 21G needle to inject Testosterone medication.   Testosterone 1.62 % Gel APPLY ONE PUMP TO EACH SHOULDER DAILY   tirzepatide 2.5 MG/0.5ML injection vial Commonly known as: ZEPBOUND Inject 2.5 mg into the skin.   varenicline 0.5 MG tablet Commonly known as: Chantix Take 1 tablet (0.5 mg total) by mouth 2 (two) times daily.   venlafaxine XR 150 MG 24 hr capsule Commonly known as: Effexor XR Take 1 capsule (150 mg total) by mouth daily with breakfast.        Allergies: No Known Allergies  Family History: Family History  Problem Relation Age of Onset   Depression Mother    Suicidality Mother  Cancer Maternal Grandfather 41       stomach   Esophageal cancer Maternal Grandfather 2   Depression Sister    Colon cancer Neg Hx    Prostate cancer Neg Hx    Diabetes Neg Hx    Hypertension Neg Hx    Hypercholesterolemia Neg Hx     Social History:  reports that he has been smoking cigarettes. He started smoking about 21 years ago. He has a 17.3 pack-year smoking history. He has quit using smokeless tobacco. He reports that he does not drink alcohol and does not use drugs.   Physical Exam: BP 114/77   Pulse (!) 101   Constitutional:  Alert and oriented, No acute distress. HEENT: [place]  AT Cardiovascular: No clubbing, cyanosis, or edema. Respiratory: Normal respiratory effort, no increased work of breathing. Psychiatric: Normal mood and affect.   Assessment & Plan:    1. Hypogonadism Symptoms stable on TRT with testosterone gel. Refill sent to pharmacy.  Lab visit 6 months testosterone, H/H Office visit 1 year testosterone, PSA, H/H.  I have reviewed the above documentation for accuracy and completeness, and I agree with the above.   Robert Altes, MD  [facility] [address] [place], [place] [address] [phone]

## 2023-09-24 ENCOUNTER — Encounter: Payer: Self-pay | Admitting: Family Medicine

## 2023-09-24 DIAGNOSIS — Z113 Encounter for screening for infections with a predominantly sexual mode of transmission: Secondary | ICD-10-CM

## 2023-09-24 DIAGNOSIS — Z7251 High risk heterosexual behavior: Secondary | ICD-10-CM

## 2023-09-25 ENCOUNTER — Other Ambulatory Visit (HOSPITAL_COMMUNITY)
Admission: RE | Admit: 2023-09-25 | Discharge: 2023-09-25 | Disposition: A | Discharge status: Home / Self Care | Source: Ambulatory Visit | Attending: Family Medicine | Admitting: Family Medicine

## 2023-09-25 DIAGNOSIS — Z7251 High risk heterosexual behavior: Secondary | ICD-10-CM | POA: Insufficient documentation

## 2023-09-25 DIAGNOSIS — Z111 Encounter for screening for respiratory tuberculosis: Secondary | ICD-10-CM | POA: Diagnosis not present

## 2023-09-26 ENCOUNTER — Other Ambulatory Visit

## 2023-09-26 DIAGNOSIS — Z113 Encounter for screening for infections with a predominantly sexual mode of transmission: Secondary | ICD-10-CM | POA: Diagnosis not present

## 2023-09-26 DIAGNOSIS — Z7251 High risk heterosexual behavior: Secondary | ICD-10-CM | POA: Diagnosis not present

## 2023-09-28 ENCOUNTER — Encounter: Payer: Self-pay | Admitting: Family Medicine

## 2023-09-28 LAB — GC/CHLAMYDIA PROBE AMP (~~LOC~~) NOT AT ARMC
Chlamydia: NEGATIVE
Comment: NEGATIVE
Comment: NORMAL
Neisseria Gonorrhea: NEGATIVE

## 2023-09-28 LAB — RPR: RPR Ser Ql: NONREACTIVE

## 2023-09-28 LAB — HIV ANTIBODY (ROUTINE TESTING W REFLEX): HIV 1&2 Ab, 4th Generation: NONREACTIVE

## 2023-09-30 DIAGNOSIS — G4733 Obstructive sleep apnea (adult) (pediatric): Secondary | ICD-10-CM | POA: Diagnosis not present

## 2023-10-26 ENCOUNTER — Other Ambulatory Visit: Payer: Self-pay | Admitting: Psychiatry

## 2023-10-26 ENCOUNTER — Telehealth: Admitting: Nurse Practitioner

## 2023-10-26 DIAGNOSIS — B356 Tinea cruris: Secondary | ICD-10-CM | POA: Diagnosis not present

## 2023-10-26 DIAGNOSIS — F411 Generalized anxiety disorder: Secondary | ICD-10-CM

## 2023-10-26 DIAGNOSIS — F3289 Other specified depressive episodes: Secondary | ICD-10-CM

## 2023-10-26 MED ORDER — FLUCONAZOLE 150 MG PO TABS: ORAL_TABLET | ORAL | 0 refills | Status: DC

## 2023-10-26 MED ORDER — NYSTATIN 100000 UNIT/GM EX POWD: 1.0000 | Freq: Three times a day (TID) | CUTANEOUS | 0 refills | Status: AC

## 2023-10-26 NOTE — Progress Notes (Signed)
 E-Visit for [employer]  We are sorry that you are not feeling well. Here is how we plan to help!  Based on what you shared with me it looks like you have tinea cruris, or "Jock Itch".  The symptoms of Jock Itch include red, peeling, itchy rash that affects the groin (crease where the leg meets the trunk).  This fungal infection can be spread through shared towels, clothing, bedding, or hard surfaces (particularly in moist areas) such as shower stalls, locker room floors, or pool area that has the fungus present. If you have a fungal infection on one part of your body, you can also spread it to other parts. For instance, men with a fungal infection on their feet sometimes spread it to their groin.   Prescription medications are only indicated for an extensive rash or if over the counter treatments have failed.  I am prescribing:Fluconazole 150 mg once weekly for two to four weeks We will also provide an anti-fungal powder that will help as well   Meds ordered this encounter  Medications   nystatin (MYCOSTATIN/NYSTOP) powder    Sig: Apply 1 Application topically 3 (three) times daily.    Dispense:  60 g    Refill:  0   fluconazole (DIFLUCAN) 150 MG tablet    Sig: Take one tablet once a week up to 4 times if needed    Dispense:  4 tablet    Refill:  0     HOME CARE:  Keep affected area clean, dry, and cool. Wash with soap and shampoo after sports or exercise and dry yourself well after bathing or swimming Wear cotton underwear and change them if they become damp or sweaty. Avoid using swimming pools, public showers, or baths.  GET HELP RIGHT AWAY IF:  Symptoms that don't away after treatment. Severe itching that persists. If your rash spreads or swells. If your rash begins to have drainage or smell. You develop a fever.  MAKE SURE YOU   Understand these instructions. Will watch your condition. Will get help right away if you are not doing well or get worse.  Thank you for  choosing an e-visit.  Your e-visit answers were reviewed by a board certified advanced clinical practitioner to complete your personal care plan. Depending upon the condition, your plan could have included both over the counter or prescription medications.  Please review your pharmacy choice. Make sure the pharmacy is open so you can pick up prescription now. If there is a problem, you may contact your provider through [employer] and have the prescription routed to another pharmacy.  Your safety is important to us . If you have drug allergies check your prescription carefully.   For the next 24 hours you can use MyChart to ask questions about today's visit, request a non-urgent call back, or ask for a work or school excuse. You will get an email in the next two days asking about your experience. I hope that your e-visit has been valuable and will speed your recovery.   References or for more information:  [email or web address]&source=search_result&selectedTitle=3~52&usage_type=default&display_rank=3   I spent approximately 5 minutes reviewing the patient's history, current symptoms and coordinating their care today.

## 2023-10-30 DIAGNOSIS — G4733 Obstructive sleep apnea (adult) (pediatric): Secondary | ICD-10-CM | POA: Diagnosis not present

## 2023-11-30 ENCOUNTER — Telehealth: Payer: PRIVATE HEALTH INSURANCE | Admitting: Psychiatry

## 2023-11-30 ENCOUNTER — Encounter: Payer: Self-pay | Admitting: Psychiatry

## 2023-11-30 DIAGNOSIS — F5101 Primary insomnia: Secondary | ICD-10-CM | POA: Diagnosis not present

## 2023-11-30 DIAGNOSIS — F411 Generalized anxiety disorder: Secondary | ICD-10-CM

## 2023-11-30 DIAGNOSIS — F17210 Nicotine dependence, cigarettes, uncomplicated: Secondary | ICD-10-CM

## 2023-11-30 DIAGNOSIS — G4733 Obstructive sleep apnea (adult) (pediatric): Secondary | ICD-10-CM | POA: Diagnosis not present

## 2023-11-30 DIAGNOSIS — F3289 Other specified depressive episodes: Secondary | ICD-10-CM

## 2023-11-30 DIAGNOSIS — Z87898 Personal history of other specified conditions: Secondary | ICD-10-CM

## 2023-11-30 DIAGNOSIS — F17200 Nicotine dependence, unspecified, uncomplicated: Secondary | ICD-10-CM

## 2023-11-30 MED ORDER — VARENICLINE TARTRATE 0.5 MG PO TABS: 0.5000 mg | ORAL_TABLET | Freq: Two times a day (BID) | ORAL | 2 refills | Status: DC

## 2023-11-30 NOTE — Progress Notes (Signed)
 Virtual Visit via Video Note  I connected with Robert [name] on 11/30/23 at 11:40 AM EDT by a video enabled telemedicine application and verified that I am speaking with the correct person using two identifiers.  Location Provider Location : [facility] Patient Location : Home  Participants: Patient , Provider   I discussed the limitations of evaluation and management by telemedicine and the availability of in person appointments. The patient expressed understanding and agreed to proceed.   I discussed the assessment and treatment plan with the patient. The patient was provided an opportunity to ask questions and all were answered. The patient agreed with the plan and demonstrated an understanding of the instructions.   The patient was advised to call back or seek an in-person evaluation if the symptoms worsen or if the condition fails to improve as anticipated.  BH MD OP Progress Note  12/01/2023 8:32 AM Robert [name]  MRN:  [id number]  Chief Complaint:  Chief Complaint  Patient presents with   Follow-up   Depression   Anxiety   Insomnia   Medication Refill  Discussed the use of AI scribe software for clinical note transcription with the patient, who gave verbal consent to proceed.  History of Present Illness Robert [name] is a 43 year old Caucasian male who lives in [place], employed, has a history of GAD, insomnia, Tourette's disorder, GERD was evaluated by telemedicine today.  He experiences difficulty sleeping, characterized by not feeling tired at night but being fatigued during the day.  The past few nights he has been having difficulty falling asleep.  He has been using hydroxyzine , usually taking two tablets to aid sleep, but paused due to a potential interaction with fluconazole  for a fungal infection. He has since resumed hydroxyzine  however the past couple of nights it did not help.  He does have doxepin  available however he has not been using it ,agreeable to trial if he  continues to have sleep issues.  It did help previously when he tried it although it takes an hour to fall asleep after taking it.  He is currently on venlafaxine  150 mg, which is effective for his depression and anxiety. His anxiety had increased due to concerns about potential layoffs at work, but he has since reconciled with the situation and feels better after discussing it with friends and Acupuncturist. No current symptoms of depression and his anxiety is low.  He has been on tirzepatide for over two months and has lost 31 pounds. He feels better physically and has been eating healthier, incorporating fruit smoothies and vitamins into his diet. He mentions taking ashwagandha and feels it is beneficial. 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  He has been using Chantix  to quit smoking and reports it was effective in reducing withdrawal symptoms. However, he ran out of Chantix  and encountered issues with refilling the prescription, which he plans to address with the pharmacy.  Denies current symptoms of depression and reports low anxiety levels.  Denies thoughts of self-harm or harm to others.     Visit Diagnosis:    ICD-10-CM   1. GAD (generalized anxiety disorder)  F41.1     2. Primary insomnia  F51.01     3. Other specified depressive episodes  F32.89    Depressive episodes with insufficient symptoms in remission    4. History of Gilles de la Tourette's syndrome  Z87.898     5. Tobacco use disorder  F17.200 varenicline  (  CHANTIX ) 0.5 MG tablet      Past Psychiatric History: I have reviewed past psychiatric history from progress note on 09/29/2020.  Past trials of Lexapro , Wellbutrin , BuSpar , Ambien -noncompliant, venlafaxine .  Past Medical History:  Past Medical History:  Diagnosis Date   ADHD    Anxiety    GERD (gastroesophageal reflux disease)    Wears contact lenses     Past Surgical History:  Procedure Laterality  Date   ESOPHAGOGASTRODUODENOSCOPY (EGD) WITH PROPOFOL  N/A 06/14/2017   Procedure: ESOPHAGOGASTRODUODENOSCOPY (EGD) WITH PROPOFOL ;  Surgeon: Jinny Carmine, MD;  [facility];  Service: Endoscopy;  Laterality: N/A;   SEPTOPLASTY  06/13/2011   WISDOM TOOTH EXTRACTION      Family Psychiatric History: I have reviewed family psychiatric history from progress note on 09/29/2020.  Family History:  Family History  Problem Relation Age of Onset   Depression Mother    Suicidality Mother    Cancer Maternal Grandfather 40       stomach   Esophageal cancer Maternal Grandfather 104   Depression Sister    Colon cancer Neg Hx    Prostate cancer Neg Hx    Diabetes Neg Hx    Hypertension Neg Hx    Hypercholesterolemia Neg Hx     Social History: I have reviewed social history from progress note on 09/29/2020. Social History   Socioeconomic History   Marital status: Divorced    Spouse name: Not on file   Number of children: 1   Years of education: Vocational   Highest education level: Associate degree: occupational, Scientist, product/process development, or vocational program  Occupational History   Occupation: IT    Comment: Commuting to [employer]  Tobacco Use   Smoking status: Every Day    Current packs/day: 1.00    Average packs/day: 1 pack/day for 17.5 years (17.5 ttl pk-yrs)    Types: Cigarettes    Start date: 10/10/2001    Last attempt to quit: 10/10/2016   Smokeless tobacco: Former   Tobacco comments:    Quit using vaping  Vaping Use   Vaping status: Former   Quit date: 04/14/2018  Substance and Sexual Activity   Alcohol use: No   Drug use: No   Sexual activity: Yes    Birth control/protection: Surgical    Comment: Vasectomy 2013  Other Topics Concern   Not on file  Social History Narrative   Not on file   Social Drivers of Health   Financial Resource Strain: Low Risk  (08/10/2023)   Overall Financial Resource Strain (CARDIA)    Difficulty of Paying Living Expenses: Not very hard  Food  Insecurity: Unknown (08/10/2023)   Hunger Vital Sign    Worried About Running Out of Food in the Last Year: Never true    Ran Out of Food in the Last Year: Patient declined  Transportation Needs: No Transportation Needs (08/10/2023)   PRAPARE - Administrator, Civil Service (Medical): No    Lack of Transportation (Non-Medical): No  Physical Activity: Insufficiently Active (08/10/2023)   Exercise Vital Sign    Days of Exercise per Week: 2 days    Minutes of Exercise per Session: 20 min  Stress: Stress Concern Present (08/10/2023)   [employer] of Occupational Health - Occupational Stress Questionnaire    Feeling of Stress : Rather much  Social Connections: Unknown (08/10/2023)   Social Connection and Isolation Panel    Frequency of Communication with Friends and Family: More than three times a week    Frequency  of Social Gatherings with Friends and Family: Three times a week    Attends Religious Services: Patient declined    Active Member of Clubs or Organizations: Yes    Attends Banker Meetings: 1 to 4 times per year    Marital Status: Divorced    Allergies: No Known Allergies  Metabolic Disorder Labs: Lab Results  Component Value Date   HGBA1C 6.4 (H) 08/06/2023   MPG 137 08/06/2023   MPG 128 08/09/2022   Lab Results  Component Value Date   PROLACTIN 8.9 05/16/2018   Lab Results  Component Value Date   CHOL 191 08/06/2023   TRIG 374 (H) 08/06/2023   HDL 34 (L) 08/06/2023   CHOLHDL 5.6 (H) 08/06/2023   LDLCALC 107 (H) 08/06/2023   LDLCALC 93 08/09/2022   Lab Results  Component Value Date   TSH 1.42 08/06/2023   TSH 1.26 08/09/2022    Therapeutic Level Labs: No results found for: LITHIUM No results found for: VALPROATE No results found for: CBMZ  Current Medications: Current Outpatient Medications  Medication Sig Dispense Refill   clonazePAM  (KLONOPIN ) 0.5 MG tablet Take 0.5-1 tablets (0.25-0.5 mg total) by mouth daily as  needed for anxiety. Please limit use 15 tablet 0   doxepin  (SINEQUAN ) 10 MG capsule Take 10-20 mg by mouth at bedtime as needed.     hydrOXYzine  (VISTARIL ) 25 MG capsule Take 1-2 capsules (25-50 mg total) by mouth at bedtime as needed for anxiety (sleep). 60 capsule 2   metoprolol  succinate (TOPROL -XL) 25 MG 24 hr tablet TAKE 1 TABLET(25 MG) BY MOUTH DAILY 90 tablet 3   NEEDLE, DISP, 18 G (BD DISP NEEDLES) 18G X 1-1/2 MISC Use 18G to draw up Testosterone  medication 50 each 0   NEEDLE, DISP, 21 G (BD ECLIPSE NEEDLE) 21G X 1-1/2 MISC Use 21G needle to inject Testosterone  medication. 50 each 0   nystatin  (MYCOSTATIN /NYSTOP ) powder Apply 1 Application topically 3 (three) times daily. 60 g 0   Syringe, Disposable, (2-3CC SYRINGE) 3 ML MISC Use as directed 100 each 0   Testosterone  1.62 % GEL APPLY ONE PUMP TO EACH SHOULDER DAILY 75 g 3   tirzepatide (ZEPBOUND) 2.5 MG/0.5ML injection vial Inject 2.5 mg into the skin.     varenicline  (CHANTIX ) 0.5 MG tablet Take 1 tablet (0.5 mg total) by mouth 2 (two) times daily. 60 tablet 2   venlafaxine  XR (EFFEXOR -XR) 150 MG 24 hr capsule TAKE ONE CAPSULE BY MOUTH DAILY WITH BREAKFAST 90 capsule 1   No current facility-administered medications for this visit.     Musculoskeletal: Strength & Muscle Tone: UTA Gait & Station: Seated Patient leans: N/A  Psychiatric Specialty Exam: Review of Systems  Psychiatric/Behavioral:  Positive for sleep disturbance. The patient is nervous/anxious.     There were no vitals taken for this visit.There is no height or weight on file to calculate BMI.  General Appearance: Casual  Eye Contact:  Fair  Speech:  Normal Rate  Volume:  Normal  Mood:  Anxious managing well  Affect:  Congruent  Thought Process:  Goal Directed and Descriptions of Associations: Intact  Orientation:  Full (Time, Place, and Person)  Thought Content: Logical   Suicidal Thoughts:  No  Homicidal Thoughts:  No  Memory:  Immediate;   Fair Recent;    Fair Remote;   Fair  Judgement:  Fair  Insight:  Fair  Psychomotor Activity:  Normal  Concentration:  Concentration: Fair and Attention Span: Fair  Recall:  Fair  Fund of Knowledge: Fair  Language: Fair  Akathisia:  No  Handed:  Right  AIMS (if indicated): not done  Assets:  Communication Skills Desire for Improvement Housing Social Support  ADL's:  Intact  Cognition: WNL  Sleep:  Poor   Screenings: AIMS    Flowsheet Row Video Visit from 11/23/2021 in [facility]  AIMS Total Score 0   GAD-7    Flowsheet Row Office Visit from 08/13/2023 in [facility] Office Visit from 05/02/2023 in [facility] Office Visit from 02/02/2023 in [facility] Video Visit from 10/16/2022 in [facility] Office Visit from 08/04/2022 in [facility]  Total GAD-7 Score 12 10 8 9 6    [id number]    Flowsheet Row Office Visit from 08/13/2023 in [facility] Office Visit from 05/02/2023 in [facility] Video Visit from 02/19/2023 in [facility] Office Visit from 02/02/2023 in [facility] Video Visit from 01/02/2023 in [facility]  PHQ-2 Total Score 2 2 1 3 3   PHQ-9 Total Score 6 10 -- 9 5   Flowsheet Row Video Visit from 11/30/2023 in [facility] Video Visit from 09/03/2023 in [facility] Video Visit from 07/09/2023 in [facility]  C-SSRS RISK CATEGORY No Risk No Risk No Risk     Assessment and Plan: Robert [name] is a 43 year old Caucasian male who has a history of GAD, depression was evaluated by telemedicine today.   Discussed assessment and plan as noted below.  Generalized anxiety disorder-stable Currently anxiety symptoms well managed on the current medication regimen. Continue Venlafaxine  150 mg daily Continue Hydroxyzine  25 to 50 mg at bedtime as needed. Continue Clonazepam  0.5 mg as needed for severe anxiety attacks only.  Previously provided a limited supply. Continue use of Ashwagandha as long as tolerated.  Depressive disorder-in remission Currently denies any significant depression symptoms. Continue Venlafaxine  150 mg daily  Insomnia-unstable Recent sleep problems, although has doxepin  available has been noncompliant.  Normally hydroxyzine  helps however had to stop the hydroxyzine  recently due to use of Diflucan .  Currently back on it. Continue sleep hygiene techniques. Restart Doxepin  10 to 20 mg at bedtime as needed Continue use of Hydroxyzine , could use an extra 25 mg at bedtime as needed if 50 mg is not effective for the next few nights.  Tobacco use disorder-improving Currently interested in quitting.  Chantix  did help however could not get a refill from the pharmacy although a prescription was sent out in March. Restart Chantix  0.5 mg twice a day. I have sent a new prescription to pharmacy. Provided counseling for 1 minute.  Follow-up Follow-up in clinic in 3 months or sooner if needed.  Patient to reach out for a sooner appointment if continues to have sleep issues.   Consent: Patient/Guardian gives verbal consent for treatment and assignment of benefits for services provided during this visit. Patient/Guardian expressed understanding and agreed to proceed.   This note was generated in part or whole with voice recognition software. Voice recognition is usually quite accurate but there are transcription errors that can and very often do occur. I apologize for any typographical errors that were not detected and corrected.    Santosh Petter, MD 12/01/2023, 8:32 AM

## 2023-12-30 DIAGNOSIS — G4733 Obstructive sleep apnea (adult) (pediatric): Secondary | ICD-10-CM | POA: Diagnosis not present

## 2024-01-30 DIAGNOSIS — G4733 Obstructive sleep apnea (adult) (pediatric): Secondary | ICD-10-CM | POA: Diagnosis not present

## 2024-02-19 ENCOUNTER — Other Ambulatory Visit: Payer: Self-pay | Admitting: Family Medicine

## 2024-02-19 DIAGNOSIS — R000 Tachycardia, unspecified: Secondary | ICD-10-CM

## 2024-02-19 DIAGNOSIS — F419 Anxiety disorder, unspecified: Secondary | ICD-10-CM

## 2024-02-20 NOTE — Telephone Encounter (Signed)
 Requested Prescriptions  Pending Prescriptions Disp Refills   metoprolol  succinate (TOPROL -XL) 25 MG 24 hr tablet [Pharmacy Med Name: METOPROLOL  SUCC ER 25 MG TAB[*]] 90 tablet 0    Sig: TAKE ONE TABLET BY MOUTH ONE TIME DAILY     Cardiovascular:  Beta Blockers Failed - 02/20/2024  4:24 PM      Failed - Valid encounter within last 6 months    Recent Outpatient Visits           6 months ago Annual physical exam   [facility] Marsa PARAS, DO       Future Appointments             In 7 months Stoioff, Glendia BROCKS, MD [facility] [place]            Passed - Last BP in normal range    BP Readings from Last 1 Encounters:  09/14/23 114/77         Passed - Last Heart Rate in normal range    Pulse Readings from Last 1 Encounters:  09/14/23 (!) 101

## 2024-02-26 ENCOUNTER — Encounter: Payer: Self-pay | Admitting: Psychiatry

## 2024-02-26 ENCOUNTER — Ambulatory Visit (INDEPENDENT_AMBULATORY_CARE_PROVIDER_SITE_OTHER): Payer: PRIVATE HEALTH INSURANCE | Admitting: Psychiatry

## 2024-02-26 VITALS — BP 116/78 | HR 96 | Temp 97.8°F | Ht 67.0 in | Wt 199.0 lb

## 2024-02-26 DIAGNOSIS — F5101 Primary insomnia: Secondary | ICD-10-CM | POA: Diagnosis not present

## 2024-02-26 DIAGNOSIS — Z87898 Personal history of other specified conditions: Secondary | ICD-10-CM

## 2024-02-26 DIAGNOSIS — F3289 Other specified depressive episodes: Secondary | ICD-10-CM

## 2024-02-26 DIAGNOSIS — F411 Generalized anxiety disorder: Secondary | ICD-10-CM

## 2024-02-26 DIAGNOSIS — F17200 Nicotine dependence, unspecified, uncomplicated: Secondary | ICD-10-CM

## 2024-02-26 NOTE — Progress Notes (Signed)
 [facility] MD OP Progress Note  02/26/2024 1:46 PM Robert [name]  MRN:  [id number]  Chief Complaint:  Chief Complaint  Patient presents with   Follow-up   Anxiety   Depression   Medication Refill   Discussed the use of AI scribe software for clinical note transcription with the patient, who gave verbal consent to proceed.  History of Present Illness Robert [name] is a 43 year old Caucasian male who lives in [place], employed, has a history of GAD, insomnia, Tourette's disorder, GERD was evaluated in office today for a follow-up appointment.  Ongoing work-related stress, particularly related to past layoffs, continues to affect him. When work slows or changes occur, he experiences increased stress and intrusive negative thoughts. Over the past month, he reports adjusting well to recent changes at work and has not experienced significant worries.  He reports improvements in overall mood and energy, and he has lost significant weight and increased his physical activity, including walking and hiking. He feels more motivated and engages in daily activities and projects, especially with his son.  He previously struggled with sleep but recently established a more consistent bedtime routine by setting an alarm for 10:00 PM and generally going to bed before 11:30 PM. He uses hydroxyzine  as needed for sleep and finds that sleeping 6 to 7 hours leaves him feeling more rested than sleeping longer. He continues to use his CPAP device nightly and considers it essential for his rest.  He reports current cigarette use, smoking between 10 and 15 cigarettes per day. Smoking frequency increases when he spends time with a friend who smokes. Periods of reduced smoking occur when he engages in activities such as hiking or when occupied. He previously used Chantix  to reduce cigarette cravings and physical symptoms, noting benefit.  Denies any suicidality, homicidality or perceptual disturbances.  Visit Diagnosis:     ICD-10-CM   1. GAD (generalized anxiety disorder)  F41.1     2. Primary insomnia  F51.01     3. Other specified depressive episodes  F32.89    Depressive episodes with insufficent symptoms currently in remission    4. History of Gilles de la Tourette's syndrome  Z87.898     5. Tobacco use disorder  F17.200    moderate      Past Psychiatric History: I have reviewed past psychiatric history from progress note on 09/29/2020.  Past trials of Lexapro , Wellbutrin , BuSpar , Ambien -noncompliant, venlafaxine .  Past Medical History:  Past Medical History:  Diagnosis Date   ADHD    Anxiety    GERD (gastroesophageal reflux disease)    Wears contact lenses     Past Surgical History:  Procedure Laterality Date   ESOPHAGOGASTRODUODENOSCOPY (EGD) WITH PROPOFOL  N/A 06/14/2017   Procedure: ESOPHAGOGASTRODUODENOSCOPY (EGD) WITH PROPOFOL ;  Surgeon: Jinny Carmine, MD;  Location: [facility];  Service: Endoscopy;  Laterality: N/A;   SEPTOPLASTY  06/13/2011   WISDOM TOOTH EXTRACTION      Family Psychiatric History: I have reviewed family psychiatric history from progress note on 09/29/2020.  Family History:  Family History  Problem Relation Age of Onset   Depression Mother    Suicidality Mother    Cancer Maternal Grandfather 56       stomach   Esophageal cancer Maternal Grandfather 13   Depression Sister    Colon cancer Neg Hx    Prostate cancer Neg Hx    Diabetes Neg Hx    Hypertension Neg Hx    Hypercholesterolemia Neg Hx     Social History: I  have reviewed social history from progress note on 09/29/2020. Social History   Socioeconomic History   Marital status: Divorced    Spouse name: Not on file   Number of children: 1   Years of education: Vocational   Highest education level: Associate degree: occupational, Scientist, product/process development, or vocational program  Occupational History   Occupation: IT    Comment: Commuting to [employer]  Tobacco Use   Smoking status: Every Day    Current  packs/day: 1.00    Average packs/day: 1 pack/day for 17.7 years (17.7 ttl pk-yrs)    Types: Cigarettes    Start date: 10/10/2001    Last attempt to quit: 10/10/2016   Smokeless tobacco: Former   Tobacco comments:    Quit using vaping.  Has cut back to 3/4 of a pack per day and trying to cut back to quit  Vaping Use   Vaping status: Former   Quit date: 04/14/2018  Substance and Sexual Activity   Alcohol use: No   Drug use: No   Sexual activity: Yes    Birth control/protection: Surgical    Comment: Vasectomy 2013  Other Topics Concern   Not on file  Social History Narrative   Not on file   Social Drivers of Health   Financial Resource Strain: Low Risk  (08/10/2023)   Overall Financial Resource Strain (CARDIA)    Difficulty of Paying Living Expenses: Not very hard  Food Insecurity: Unknown (08/10/2023)   Hunger Vital Sign    Worried About Running Out of Food in the Last Year: Never true    Ran Out of Food in the Last Year: Patient declined  Transportation Needs: No Transportation Needs (08/10/2023)   PRAPARE - Administrator, Civil Service (Medical): No    Lack of Transportation (Non-Medical): No  Physical Activity: Insufficiently Active (08/10/2023)   Exercise Vital Sign    Days of Exercise per Week: 2 days    Minutes of Exercise per Session: 20 min  Stress: Stress Concern Present (08/10/2023)   [employer] of Occupational Health - Occupational Stress Questionnaire    Feeling of Stress : Rather much  Social Connections: Unknown (08/10/2023)   Social Connection and Isolation Panel    Frequency of Communication with Friends and Family: More than three times a week    Frequency of Social Gatherings with Friends and Family: Three times a week    Attends Religious Services: Patient declined    Active Member of Clubs or Organizations: Yes    Attends Banker Meetings: 1 to 4 times per year    Marital Status: Divorced    Allergies: No Known  Allergies  Metabolic Disorder Labs: Lab Results  Component Value Date   HGBA1C 6.4 (H) 08/06/2023   MPG 137 08/06/2023   MPG 128 08/09/2022   Lab Results  Component Value Date   PROLACTIN 8.9 05/16/2018   Lab Results  Component Value Date   CHOL 191 08/06/2023   TRIG 374 (H) 08/06/2023   HDL 34 (L) 08/06/2023   CHOLHDL 5.6 (H) 08/06/2023   LDLCALC 107 (H) 08/06/2023   LDLCALC 93 08/09/2022   Lab Results  Component Value Date   TSH 1.42 08/06/2023   TSH 1.26 08/09/2022    Therapeutic Level Labs: No results found for: LITHIUM No results found for: VALPROATE No results found for: CBMZ  Current Medications: Current Outpatient Medications  Medication Sig Dispense Refill   ASHWAGANDHA PO Take by mouth.     clonazePAM  (KLONOPIN ) 0.5  MG tablet Take 0.5-1 tablets (0.25-0.5 mg total) by mouth daily as needed for anxiety. Please limit use (Patient not taking: Reported on 02/26/2024) 15 tablet 0   doxepin  (SINEQUAN ) 10 MG capsule Take 10-20 mg by mouth at bedtime as needed.     hydrOXYzine  (VISTARIL ) 25 MG capsule Take 1-2 capsules (25-50 mg total) by mouth at bedtime as needed for anxiety (sleep). 60 capsule 2   metoprolol  succinate (TOPROL -XL) 25 MG 24 hr tablet TAKE ONE TABLET BY MOUTH ONE TIME DAILY 90 tablet 0   NEEDLE, DISP, 18 G (BD DISP NEEDLES) 18G X 1-1/2 MISC Use 18G to draw up Testosterone  medication 50 each 0   NEEDLE, DISP, 21 G (BD ECLIPSE NEEDLE) 21G X 1-1/2 MISC Use 21G needle to inject Testosterone  medication. 50 each 0   nystatin  (MYCOSTATIN /NYSTOP ) powder Apply 1 Application topically 3 (three) times daily. 60 g 0   Syringe, Disposable, (2-3CC SYRINGE) 3 ML MISC Use as directed 100 each 0   Testosterone  1.62 % GEL APPLY ONE PUMP TO EACH SHOULDER DAILY 75 g 3   tirzepatide (ZEPBOUND) 2.5 MG/0.5ML injection vial Inject 2.5 mg into the skin.     venlafaxine  XR (EFFEXOR -XR) 150 MG 24 hr capsule TAKE ONE CAPSULE BY MOUTH DAILY WITH BREAKFAST 90 capsule 1    varenicline  (CHANTIX ) 0.5 MG tablet Take 1 tablet (0.5 mg total) by mouth 2 (two) times daily. (Patient not taking: Reported on 02/26/2024) 60 tablet 2   No current facility-administered medications for this visit.     Musculoskeletal: Strength & Muscle Tone: within normal limits Gait & Station: normal Patient leans: N/A  Psychiatric Specialty Exam: Review of Systems  Psychiatric/Behavioral:  The patient is nervous/anxious.     Blood pressure 116/78, pulse 96, temperature 97.8 F (36.6 C), temperature source Temporal, height 5' 7 (1.702 m), weight 199 lb (90.3 kg), SpO2 99%.Body mass index is 31.17 kg/m.  General Appearance: Casual  Eye Contact:  Fair  Speech:  Clear and Coherent  Volume:  Normal  Mood:  Anxious coping well  Affect:  Appropriate  Thought Process:  Goal Directed and Descriptions of Associations: Intact  Orientation:  Full (Time, Place, and Person)  Thought Content: Logical   Suicidal Thoughts:  No  Homicidal Thoughts:  No  Memory:  Immediate;   Fair Recent;   Fair Remote;   Fair  Judgement:  Fair  Insight:  Fair  Psychomotor Activity:  Normal  Concentration:  Concentration: Fair and Attention Span: Fair  Recall:  [employer] of Knowledge: Fair  Language: Fair  Akathisia:  No  Handed:  Right  AIMS (if indicated): not done  Assets:  Communication Skills Desire for Improvement Housing Intimacy Social Support Transportation  ADL's:  Intact  Cognition: WNL  Sleep:  improving   Screenings: AIMS    Flowsheet Row Video Visit from 11/23/2021 in [facility]  AIMS Total Score 0   GAD-7    Flowsheet Row Office Visit from 02/26/2024 in [facility] Office Visit from 08/13/2023 in [facility] Office Visit from 05/02/2023 in [facility] Office Visit from 02/02/2023 in [facility] Video  Visit from 10/16/2022 in [facility]  Total GAD-7 Score 5 12 10 8 9    PHQ2-9    Flowsheet Row Office Visit from 02/26/2024 in [facility] Office Visit from 08/13/2023 in [facility] Visit from 05/02/2023 in [facility] Video Visit from 02/19/2023 in [facility] Office Visit from 02/02/2023 in [facility]  PHQ-2 Total Score 2 2 2 1 3   PHQ-9 Total Score 6 6 10  -- 9   Flowsheet Row Office Visit from 02/26/2024 in [facility] Video Visit from 11/30/2023 in [facility] Video Visit from 09/03/2023 in [facility]  C-SSRS RISK CATEGORY No Risk No Risk No Risk     Assessment and Plan: Robert [name] is a 43 year old Caucasian male who has a history of anxiety, depression was evaluated in office today.  Discussed assessment and plan as noted below.   1. GAD (generalized anxiety disorder)-stable Currently well-managed on current medication regimen.  Would like to taper down the venlafaxine  in the future.  Agrees to monitor his symptoms for the next few months and if they remain stable we will consider the same. Continue Venlafaxine  150 mg daily Continue Hydroxyzine  25 to 50 mg at bedtime as needed Continue Clonazepam  0.5 mg as needed for severe anxiety attacks only. Reviewed [place] PMP AWARxE  2. Primary insomnia-stable Currently well-managed on the medication regimen.  Continues to use CPAP for obstructive sleep apnea. Continue Doxepin  10 to 20 mg at bedtime as needed. Continue Hydroxyzine  as needed. Continue CPAP therapy for obstructive sleep apnea.  3. Other specified depressive episodes in remission Currently denies any depression symptoms Continue Venlafaxine   150 mg daily  4. History of Gilles de la Tourette's syndrome-chronic Will monitor closely.  5. Tobacco use disorder moderate-improving Currently motivated to work on quitting.  Good response to Chantix  although ran out of his prescription. Restart Chantix  0.5 mg twice a day.  Follow-up Follow-up in clinic in 3 to 4 months or sooner if needed.  Consent: Patient/Guardian gives verbal consent for treatment and assignment of benefits for services provided during this visit. Patient/Guardian expressed understanding and agreed to proceed.   This note was generated in part or whole with voice recognition software. Voice recognition is usually quite accurate but there are transcription errors that can and very often do occur. I apologize for any typographical errors that were not detected and corrected.    Telisa Ohlsen, MD 02/26/2024, 1:46 PM

## 2024-03-17 ENCOUNTER — Other Ambulatory Visit

## 2024-03-17 DIAGNOSIS — E291 Testicular hypofunction: Secondary | ICD-10-CM | POA: Diagnosis not present

## 2024-03-18 LAB — HEMOGLOBIN AND HEMATOCRIT, BLOOD
Hematocrit: 49.5 % (ref 37.5–51.0)
Hemoglobin: 16.3 g/dL (ref 13.0–17.7)

## 2024-03-18 LAB — TESTOSTERONE: Testosterone: 399 ng/dL (ref 264–916)

## 2024-03-25 ENCOUNTER — Other Ambulatory Visit: Payer: Self-pay | Admitting: Urology

## 2024-05-14 DIAGNOSIS — Z79899 Other long term (current) drug therapy: Secondary | ICD-10-CM

## 2024-05-28 ENCOUNTER — Other Ambulatory Visit: Payer: Self-pay | Admitting: Family Medicine

## 2024-05-28 ENCOUNTER — Other Ambulatory Visit
Admission: RE | Admit: 2024-05-28 | Discharge: 2024-05-28 | Disposition: A | Discharge status: Home / Self Care | Attending: Psychiatry | Admitting: Psychiatry

## 2024-05-28 DIAGNOSIS — R000 Tachycardia, unspecified: Secondary | ICD-10-CM

## 2024-05-28 DIAGNOSIS — Z79899 Other long term (current) drug therapy: Secondary | ICD-10-CM | POA: Insufficient documentation

## 2024-05-28 DIAGNOSIS — F419 Anxiety disorder, unspecified: Secondary | ICD-10-CM

## 2024-05-29 LAB — URINE DRUGS OF ABUSE SCREEN W ALC, ROUTINE (REF LAB)
Amphetamines, Urine: NEGATIVE ng/mL
Barbiturate, Ur: NEGATIVE ng/mL
Benzodiazepine Quant, Ur: NEGATIVE ng/mL
Cannabinoid Quant, Ur: NEGATIVE ng/mL
Cocaine (Metab.): NEGATIVE ng/mL
Creatinine, Urine: 155.8 mg/dL (ref 20.0–300.0)
Ethanol U, Quan: NEGATIVE %
Methadone Screen, Urine: NEGATIVE ng/mL
Nitrite Urine, Quantitative: NEGATIVE ug/mL
OPIATE SCREEN URINE: NEGATIVE ng/mL
Phencyclidine, Ur: NEGATIVE ng/mL
Propoxyphene, Urine: NEGATIVE ng/mL
pH, Urine: 7 (ref 4.5–8.9)

## 2024-05-30 ENCOUNTER — Ambulatory Visit: Payer: Self-pay | Admitting: Psychiatry

## 2024-05-30 NOTE — Telephone Encounter (Signed)
 Courtesy refill given, appointment needed.   Requested Prescriptions  Pending Prescriptions Disp Refills   metoprolol  succinate (TOPROL -XL) 25 MG 24 hr tablet [Pharmacy Med Name: METOPROLOL  SUCC ER 25 MG TAB[*]] 30 tablet 0    Sig: Take 1 tablet (25 mg total) by mouth daily. OFFICE VISIT NEEDED FOR ADDITIONAL REFILLS     Cardiovascular:  Beta Blockers Failed - 05/30/2024 10:36 PM      Failed - Valid encounter within last 6 months    Recent Outpatient Visits           9 months ago Annual physical exam   [facility] Edman, Marsa PARAS, DO       Future Appointments             In 3 months Stoioff, Glendia BROCKS, MD [facility] [place]            Passed - Last BP in normal range    BP Readings from Last 1 Encounters:  09/14/23 114/77         Passed - Last Heart Rate in normal range    Pulse Readings from Last 1 Encounters:  09/14/23 (!) 101

## 2024-06-09 ENCOUNTER — Telehealth: Payer: PRIVATE HEALTH INSURANCE | Admitting: Psychiatry

## 2024-06-09 ENCOUNTER — Encounter: Payer: Self-pay | Admitting: Psychiatry

## 2024-06-09 DIAGNOSIS — F5101 Primary insomnia: Secondary | ICD-10-CM

## 2024-06-09 DIAGNOSIS — F411 Generalized anxiety disorder: Secondary | ICD-10-CM | POA: Diagnosis not present

## 2024-06-09 DIAGNOSIS — F17200 Nicotine dependence, unspecified, uncomplicated: Secondary | ICD-10-CM

## 2024-06-09 DIAGNOSIS — F3289 Other specified depressive episodes: Secondary | ICD-10-CM | POA: Diagnosis not present

## 2024-06-09 DIAGNOSIS — Z87898 Personal history of other specified conditions: Secondary | ICD-10-CM

## 2024-06-09 MED ORDER — MIRTAZAPINE 7.5 MG PO TABS: 7.5000 mg | ORAL_TABLET | Freq: Every day | ORAL | 1 refills | Status: AC

## 2024-06-09 MED ORDER — CLONAZEPAM 0.5 MG PO TABS: 0.2500 mg | ORAL_TABLET | Freq: Every day | ORAL | 0 refills | Status: AC | PRN

## 2024-06-09 NOTE — Progress Notes (Unsigned)
 Virtual Visit via Video Note  I connected with Robert [name] on 06/09/2024 at  4:20 PM EST by a video enabled telemedicine application and verified that I am speaking with the correct person using two identifiers.  Location Provider Location : [facility] Patient Location : Home  Participants: Patient , Provider    I discussed the limitations of evaluation and management by telemedicine and the availability of in person appointments. The patient expressed understanding and agreed to proceed.   I discussed the assessment and treatment plan with the patient. The patient was provided an opportunity to ask questions and all were answered. The patient agreed with the plan and demonstrated an understanding of the instructions.   The patient was advised to call back or seek an in-person evaluation if the symptoms worsen or if the condition fails to improve as anticipated.  BH MD OP Progress Note  06/09/2024 4:27 PM Robert [name]  MRN:  [id number]  Chief Complaint:  Chief Complaint  Patient presents with   Follow-up   Medication Refill   Anxiety   Depression   Discussed the use of AI scribe software for clinical note transcription with the patient, who gave verbal consent to proceed.  History of Present Illness Robert [name] is a 43 year old Caucasian male who lives in [place], employed, has a history of GAD, insomnia, Tourette's disorder, GERD was evaluated by telemedicine today for a follow-up appointment.   During the past month, he has experienced increased stress and anxiety, which he reports relates in part to the holiday season and a recurring pattern of heightened symptoms during winter. He describes worsening anxiety symptoms, including increased physical tension, exacerbation of vocal tics, and frequent episodes of brain fog and dissociation. These symptoms interfere with his daily functioning, leading to difficulties with work, scheduling events, and driving beyond his local area  due to tension and the risk of panic attacks. While driving, he notes that tension can build up, sometimes resulting in tension headaches or panic attacks characterized by shortness of breath, increased heart rate, and haziness. He finds these symptoms increasingly difficult to manage and reports a negative impact on his overall quality of life.  He continues to have sleep difficulties, with frequent awakenings during the night and discomfort upon waking. He eventually returns to sleep but finds the disruption significant. He notes that sleep improves only when he is completely mentally and physically exhausted. He uses a CPAP every night and has tried doxepin  10 mg as needed earlier in the month, but he reports that it, along with other sedating agents such as magnesium glycinate or antihistamines, leaves him feeling groggy and fatigued throughout the day. Despite consuming caffeine, he continues to experience persistent fatigue, physical signs of stimulation, and ongoing brain fog and tiredness.  He reports that venlafaxine  helps manage most of his physical symptoms, and that missing a dose leads to the return of symptoms such as dizziness, vertigo, and brain zaps.  He has used clonazepam  as needed for panic attacks in the past and expresses interest in having a prescription available for emergencies.  Ongoing psychosocial stressors include job-related stress and the process of searching for a new job without success, which he finds particularly stressful. He spends more time at home and notes that his symptoms have had a greater impact on his functioning over the past year. He uses a remote therapy app provided by his workplace, with his last session on December 15, and plans to schedule another session.  He denies any thoughts  of hurting himself or others.      Visit Diagnosis:    ICD-10-CM   1. GAD (generalized anxiety disorder)  F41.1 clonazePAM  (KLONOPIN ) 0.5 MG tablet    2. Primary  insomnia  F51.01 mirtazapine (REMERON) 7.5 MG tablet    3. Other specified depressive episodes  F32.89 mirtazapine (REMERON) 7.5 MG tablet   Depressive episodes with insufficent symptoms currently in remission    4. History of Gilles de la Tourette's syndrome  Z87.898     5. Tobacco use disorder  F17.200    moderate      Past Psychiatric History: I have reviewed past psychiatric history from progress note on 09/29/2020.  Past trials of Lexapro , Wellbutrin , BuSpar , Ambien -noncompliant, venlafaxine   Past Medical History:  Past Medical History:  Diagnosis Date   ADHD    Anxiety    GERD (gastroesophageal reflux disease)    Wears contact lenses     Past Surgical History:  Procedure Laterality Date   ESOPHAGOGASTRODUODENOSCOPY (EGD) WITH PROPOFOL  N/A 06/14/2017   Procedure: ESOPHAGOGASTRODUODENOSCOPY (EGD) WITH PROPOFOL ;  Surgeon: Jinny Carmine, MD;  Location: [facility];  Service: Endoscopy;  Laterality: N/A;   SEPTOPLASTY  06/13/2011   WISDOM TOOTH EXTRACTION      Family Psychiatric History: I reviewed family psychiatric history from progress note on 09/29/2020.  Family History:  Family History  Problem Relation Age of Onset   Depression Mother    Suicidality Mother    Cancer Maternal Grandfather 14       stomach   Esophageal cancer Maternal Grandfather 10   Depression Sister    Colon cancer Neg Hx    Prostate cancer Neg Hx    Diabetes Neg Hx    Hypertension Neg Hx    Hypercholesterolemia Neg Hx     Social History: I have reviewed social history from progress note on 09/29/2020. Social History   Socioeconomic History   Marital status: Divorced    Spouse name: Not on file   Number of children: 1   Years of education: Vocational   Highest education level: Associate degree: occupational, scientist, product/process development, or vocational program  Occupational History   Occupation: IT    Comment: Commuting to [employer]  Tobacco Use   Smoking status: Every Day    Current packs/day:  1.00    Average packs/day: 1 pack/day for 18.0 years (18.0 ttl pk-yrs)    Types: Cigarettes    Start date: 10/10/2001    Last attempt to quit: 10/10/2016   Smokeless tobacco: Former   Tobacco comments:    Quit using vaping.  Has cut back to 3/4 of a pack per day and trying to cut back to quit  Vaping Use   Vaping status: Former   Quit date: 04/14/2018  Substance and Sexual Activity   Alcohol use: No   Drug use: No   Sexual activity: Yes    Birth control/protection: Surgical    Comment: Vasectomy 2013  Other Topics Concern   Not on file  Social History Narrative   Not on file   Social Drivers of Health   Tobacco Use: High Risk (06/09/2024)   Patient History    Smoking Tobacco Use: Every Day    Smokeless Tobacco Use: Former    Passive Exposure: Not on Actuary Strain: Low Risk (08/10/2023)   Overall Financial Resource Strain (CARDIA)    Difficulty of Paying Living Expenses: Not very hard  Food Insecurity: Unknown (08/10/2023)   Hunger Vital Sign    Worried About  Running Out of Food in the Last Year: Never true    Ran Out of Food in the Last Year: Patient declined  Transportation Needs: No Transportation Needs (08/10/2023)   PRAPARE - Administrator, Civil Service (Medical): No    Lack of Transportation (Non-Medical): No  Physical Activity: Insufficiently Active (08/10/2023)   Exercise Vital Sign    Days of Exercise per Week: 2 days    Minutes of Exercise per Session: 20 min  Stress: Stress Concern Present (08/10/2023)   Robert [name] [employer] Occupational [employer] - Occupational Stress Questionnaire    Feeling of Stress : Rather much  Social Connections: Unknown (08/10/2023)   Social Connection and Isolation Panel    Frequency of Communication with Friends and Family: More than three times a week    Frequency of Social Gatherings with Friends and Family: Three times a week    Attends Religious Services: Patient declined    Active Member of Clubs or  Organizations: Yes    Attends Banker Meetings: 1 to 4 times per year    Marital Status: Divorced  Depression (PHQ2-9): Medium Risk (02/26/2024)   Depression (PHQ2-9)    PHQ-2 Score: 6  Alcohol Screen: Low Risk (07/08/2021)   Alcohol Screen    Last Alcohol Screening Score (AUDIT): 0  Housing: Unknown (08/10/2023)   Housing Stability Vital Sign    Unable to Pay for Housing in the Last Year: No    Number of Times Moved in the Last Year: Not on file    Homeless in the Last Year: No  Utilities: Not At Risk (07/09/2023)   Received from [facility]    Threatened with loss of utilities: No  Health Literacy: Not on file    Allergies: Allergies[1]  Metabolic Disorder Labs: Lab Results  Component Value Date   HGBA1C 6.4 (H) 08/06/2023   MPG 137 08/06/2023   MPG 128 08/09/2022   Lab Results  Component Value Date   PROLACTIN 8.9 05/16/2018   Lab Results  Component Value Date   CHOL 191 08/06/2023   TRIG 374 (H) 08/06/2023   HDL 34 (L) 08/06/2023   CHOLHDL 5.6 (H) 08/06/2023   LDLCALC 107 (H) 08/06/2023   LDLCALC 93 08/09/2022   Lab Results  Component Value Date   TSH 1.42 08/06/2023   TSH 1.26 08/09/2022    Therapeutic Level Labs: No results found for: LITHIUM No results found for: VALPROATE No results found for: CBMZ  Current Medications: Current Outpatient Medications  Medication Sig Dispense Refill   mirtazapine (REMERON) 7.5 MG tablet Take 1 tablet (7.5 mg total) by mouth at bedtime. 30 tablet 1   ASHWAGANDHA PO Take by mouth.     clonazePAM  (KLONOPIN ) 0.5 MG tablet Take 0.5-1 tablets (0.25-0.5 mg total) by mouth daily as needed for anxiety. Please limit use 15 tablet 0   hydrOXYzine  (VISTARIL ) 25 MG capsule Take 1-2 capsules (25-50 mg total) by mouth at bedtime as needed for anxiety (sleep). 60 capsule 2   metoprolol  succinate (TOPROL -XL) 25 MG 24 hr tablet Take 1 tablet (25 mg total) by mouth daily. OFFICE VISIT  NEEDED FOR ADDITIONAL REFILLS 30 tablet 0   NEEDLE, DISP, 18 G (BD DISP NEEDLES) 18G X 1-1/2 MISC Use 18G to draw up Testosterone  medication 50 each 0   NEEDLE, DISP, 21 G (BD ECLIPSE NEEDLE) 21G X 1-1/2 MISC Use 21G needle to inject Testosterone  medication. 50 each 0   nystatin  (MYCOSTATIN /NYSTOP ) powder  Apply 1 Application topically 3 (three) times daily. 60 g 0   Syringe, Disposable, (2-3CC SYRINGE) 3 ML MISC Use as directed 100 each 0   Testosterone  1.62 % GEL APPLY ONE PUMP TO EACH SHOULDER TOPICALLY ONE TIME DAILY 75 g 3   tirzepatide (ZEPBOUND) 2.5 MG/0.5ML injection vial Inject 2.5 mg into the skin.     varenicline  (CHANTIX ) 0.5 MG tablet Take 1 tablet (0.5 mg total) by mouth 2 (two) times daily. (Patient not taking: Reported on 02/26/2024) 60 tablet 2   venlafaxine  XR (EFFEXOR -XR) 150 MG 24 hr capsule TAKE ONE CAPSULE BY MOUTH DAILY WITH BREAKFAST 90 capsule 1   No current facility-administered medications for this visit.     Musculoskeletal: Strength & Muscle Tone: UTA Gait & Station: Seated Patient leans: N/A  Psychiatric Specialty Exam: Review of Systems  Psychiatric/Behavioral:  Positive for sleep disturbance. The patient is nervous/anxious.        Sadness mostly situational    There were no vitals taken for this visit.There is no height or weight on file to calculate BMI.  General Appearance: Fairly Groomed  Eye Contact:  Fair  Speech:  Clear and Coherent  Volume:  Normal  Mood:  Anxious, sadness mostly situational  Affect:  Congruent  Thought Process:  Goal Directed and Descriptions of Associations: Intact  Orientation:  Full (Time, Place, and Person)  Thought Content: Logical   Suicidal Thoughts:  No  Homicidal Thoughts:  No  Memory:  Immediate;   Fair Recent;   Fair Remote;   Fair  Judgement:  Fair  Insight:  Fair  Psychomotor Activity:  Normal  Concentration:  Concentration: Fair and Attention Span: Fair  Recall:  [employer] of Knowledge: Fair   Language: Fair  Akathisia:  No  Handed:  Right  AIMS (if indicated): not done  Assets:  Communication Skills Desire for Improvement Housing Social Support Transportation Vocational/Educational  ADL's:  Intact  Cognition: WNL  Sleep:  varies   Screenings: AIMS    Flowsheet Row Video Visit from 11/23/2021 in [facility]  AIMS Total Score 0   GAD-7    Flowsheet Row Office Visit from 02/26/2024 in [facility] Office Visit from 08/13/2023 in [facility] Office Visit from 05/02/2023 in [facility] Office Visit from 02/02/2023 in [facility] Video Visit from 10/16/2022 in [facility]  Total GAD-7 Score 5 12 10 8 9    PHQ2-9    Flowsheet Row Office Visit from 02/26/2024 in [facility] Office Visit from 08/13/2023 in [facility] Office Visit from 05/02/2023 in [facility] Video Visit from 02/19/2023 in [facility] Office Visit from 02/02/2023 in [facility]  PHQ-2 Total Score 2 2 2 1 3   PHQ-9 Total Score 6 6 10  -- 9   Flowsheet Row Video Visit from 06/09/2024 in [facility] Office Visit from 02/26/2024 in [facility] Video Visit from 11/30/2023 in [facility]  C-SSRS RISK CATEGORY No Risk No Risk No Risk     Assessment and Plan: Diron Haddon is a 43 year old Caucasian male who presented for a follow-up appointment, discussed assessment and plan as noted below.  1. GAD (generalized anxiety disorder)-unstable On going anxiety symptoms,  interested in trial of Mirtazapine. Start  Mirtazapine 7.5 mg at bedtime.  Provided medication education including weight gain side effects. Continue Venlafaxine  150 mg daily Continue Hydroxyzine  25 to 50 mg at bedtime as needed Continue Clonazepam  0.5 mg as needed for severe anxiety attacks only Reviewed  PMP AWARxE Encouraged to continue CBT   2. Primary insomnia-unstable Ongoing sleep problems.  Currently compliant on CPAP.  Doxepin  causes grogginess. Discontinue Doxepin  Start Mirtazapine 7.5 mg at bedtime Continue Hydroxyzine  as needed. Continue CPAP therapy.   3. Other specified depressive episodes-unstable Does report ongoing mood symptoms mostly exacerbated by anxiety. Start Mirtazapine as prescribed Continue Venlafaxine  150 mg daily Continue CBT  4. History of Gilles de la Tourette's syndrome Will monitor closely  5. Tobacco use disorder-improving Will reevaluate in future sessions.  Follow-up Follow-up in clinic in 3 to 4 weeks or sooner if needed.    Collaboration of Care: Collaboration of Care: Referral or follow-up with counselor/therapist AEB encouraged to continue psychotherapy sessions.  Patient/Guardian was advised Release of Information must be obtained prior to any record release in order to collaborate their care with an outside provider. Patient/Guardian was advised if they have not already done so to contact the registration department to sign all necessary forms in order for us  to release information regarding their care.   Consent: Patient/Guardian gives verbal consent for treatment and assignment of benefits for services provided during this visit. Patient/Guardian expressed understanding and agreed to proceed.   This note was generated in part or whole with voice recognition software. Voice recognition is usually quite accurate but there are transcription errors that can and very often do occur. I apologize for any typographical errors that were not detected and corrected.    Aleczander Fandino, MD 06/10/2024, 8:43 AM     [1] No Known Allergies

## 2024-06-09 NOTE — Patient Instructions (Signed)
 Mirtazapine  Tablets What is this medication? MIRTAZAPINE  (mir TAZ a peen) treats depression. It increases the amount of serotonin and norepinephrine in the brain, substances that help regulate mood. This medicine may be used for other purposes; ask your health care provider or pharmacist if you have questions. COMMON BRAND NAME(S): Remeron  What should I tell my care team before I take this medication? They need to know if you have any of these conditions: Bipolar disorder Dehydration Glaucoma Have had a stroke Heart or blood vessel conditions Kidney disease Liver disease Low blood pressure Low levels of sodium in the blood Low white blood cell levels Seizures Suicidal thoughts, plans, or attempt An unusual or allergic reaction to mirtazapine , other medications, foods, dyes, or preservatives Pregnant or trying to get pregnant Breastfeeding How should I use this medication? Take this medication by mouth with water. Take it as directed on the prescription label at the same time every day. You can take it with or without food. If it upsets your stomach, take it with food. Keep taking this medication unless your care team tells you to stop. Stopping it too quickly can cause serious side effects. It can also make your condition worse. A special MedGuide will be given to you by the pharmacist with each prescription and refill. Be sure to read this information carefully each time. Talk to your care team about the use of this medication in children. Special care may be needed. Overdosage: If you think you have taken too much of this medicine contact a poison control center or emergency room at once. NOTE: This medicine is only for you. Do not share this medicine with others. What if I miss a dose? If you miss a dose, take it as soon as you can. If it is almost time for your next dose, take only that dose. Do not take double or extra doses. What may interact with this medication? Do not take  this medication with any of the following: Cisapride Dronedarone Levoketoconazole Linezolid MAOIs, such as Marplan, Nardil, and Parnate Methylene blue Pimozide Some medications for fungal infections, such as ketoconazole, posaconazole, voriconazole Thioridazine This medication may also interact with the following: Alcohol Benzodiazepines, such as lorazepam Cimetidine Clarithromycin Other medications that cause heart rhythm changes Opioids Rifampin Some medications for depression, anxiety, or other mental health conditions Some medication for migraines, such as sumatriptan Some medications for seizures, such as carbamazepine or phenytoin Stimulant medications for ADHD, weight loss, or staying awake Supplements, such as St. John's wort or tryptophan Warfarin Other medications may affect the way this medication works. Talk with your care team about all the medications you take. They may suggest changes to your treatment plan to lower the risk of side effects and to make sure your medications work as intended. This list may not describe all possible interactions. Give your health care provider a list of all the medicines, herbs, non-prescription drugs, or dietary supplements you use. Also tell them if you smoke, drink alcohol, or use illegal drugs. Some items may interact with your medicine. What should I watch for while using this medication? Visit your care team for regular checks on your progress. It may be some time before you see the benefit from this medication. This medication may worsen depression and cause thoughts of suicide. This can happen at any time but is more common after first starting treatment and after a change in dose. Talk to your care team right away if you have changes in mood and behavior or  thoughts of self-harm or suicide. They can help you. This medication may affect your coordination, reaction time, or judgment. Do not drive or operate machinery until you know  how this medication affects you. Sit up or stand slowly to reduce the risk of dizzy or fainting spells. Drinking alcohol with this medication can increase the risk of these side effects. Your mouth may get dry. Chewing sugarless gum or sucking hard candy and drinking plenty of water may help. Contact your care team if the problem does not go away or is severe. What side effects may I notice from receiving this medication? Side effects that you should report to your care team as soon as possible: Allergic reactions--skin rash, itching, hives, swelling of the face, lips, tongue, or throat Heart rhythm changes--fast or irregular heartbeat, dizziness, feeling faint or lightheaded, chest pain, trouble breathing Infection--fever, chills, cough, or sore throat Irritability, confusion, fast or irregular heartbeat, muscle stiffness, twitching muscles, sweating, high fever, seizure, chills, vomiting, diarrhea, which may be signs of serotonin syndrome Low sodium level--muscle weakness, fatigue, dizziness, headache, confusion Rash, fever, and swollen lymph nodes Redness, blistering, peeling, or loosening of the skin, including inside the mouth Seizures Sudden eye pain or change in vision such as blurry vision, seeing halos around lights, vision loss Thoughts of suicide or self-harm, worsening mood, feelings of depression Side effects that usually do not require medical attention (report these to your care team if they continue or are bothersome): Constipation Dizziness Drowsiness Dry mouth Increase in appetite Weight gain This list may not describe all possible side effects. Call your doctor for medical advice about side effects. You may report side effects to FDA at 1-800-FDA-1088. Where should I keep my medication? Keep out of the reach of children and pets. Store at room temperature between 20 and 25 degrees C (68 and 77 degrees F). Protect from light and moisture. Keep the container tightly closed.  Get rid of any unused medication after the expiration date. To get rid of medications that are no longer needed or have expired: Take the medication to a take-back program. Check with your pharmacy or law enforcement to find a location. If you cannot return the medication, check the label or package insert to see if the medication should be thrown out in the garbage or flushed down the toilet. If you are not sure, ask your care team. If it is safe to put it in the trash, empty the medication out of the container. Mix it with cat litter, dirt, coffee grounds, or another unwanted substance. Seal the mixture in a bag or container. Put it in the trash. NOTE: This sheet is a summary. It may not cover all possible information. If you have questions about this medicine, talk to your doctor, pharmacist, or health care provider.  2025 Elsevier/Gold Standard (2023-09-26 00:00:00)

## 2024-06-29 ENCOUNTER — Other Ambulatory Visit: Payer: Self-pay | Admitting: Psychiatry

## 2024-06-29 ENCOUNTER — Other Ambulatory Visit: Payer: Self-pay | Admitting: Family Medicine

## 2024-06-29 DIAGNOSIS — F419 Anxiety disorder, unspecified: Secondary | ICD-10-CM

## 2024-06-29 DIAGNOSIS — F3289 Other specified depressive episodes: Secondary | ICD-10-CM

## 2024-06-29 DIAGNOSIS — R000 Tachycardia, unspecified: Secondary | ICD-10-CM

## 2024-06-29 DIAGNOSIS — F411 Generalized anxiety disorder: Secondary | ICD-10-CM

## 2024-06-30 ENCOUNTER — Other Ambulatory Visit: Payer: Self-pay | Admitting: Family Medicine

## 2024-06-30 NOTE — Telephone Encounter (Signed)
 Requested medication (s) are due for refill today -yes  Requested medication (s) are on the active medication list -yes  Future visit scheduled -yes  Last refill: 05/30/24 #30  Notes to clinic: Last refill has notes- appointment scheduled 08/29/24  Requested Prescriptions  Pending Prescriptions Disp Refills   metoprolol  succinate (TOPROL -XL) 25 MG 24 hr tablet [Pharmacy Med Name: METOPROLOL  SUCC ER 25 MG TAB[*]] 30 tablet 0    Sig: TAKE ONE TABLET BY MOUTH ONE TIME DAILY **PLEASE SCHEDULE OFFICE VISIT FOR ADDITIONAL REFILLS**     Cardiovascular:  Beta Blockers Failed - 06/30/2024  4:22 PM      Failed - Valid encounter within last 6 months    Recent Outpatient Visits           10 months ago Annual physical exam   [place] [facility] [place] Marsa PARAS, DO       Future Appointments             In 2 months Stoioff, Glendia BROCKS, MD [facility] [place]            Passed - Last BP in normal range    BP Readings from Last 1 Encounters:  09/14/23 114/77         Passed - Last Heart Rate in normal range    Pulse Readings from Last 1 Encounters:  09/14/23 (!) 101            Requested Prescriptions  Pending Prescriptions Disp Refills   metoprolol  succinate (TOPROL -XL) 25 MG 24 hr tablet [Pharmacy Med Name: METOPROLOL  SUCC ER 25 MG TAB[*]] 30 tablet 0    Sig: TAKE ONE TABLET BY MOUTH ONE TIME DAILY **PLEASE SCHEDULE OFFICE VISIT FOR ADDITIONAL REFILLS**     Cardiovascular:  Beta Blockers Failed - 06/30/2024  4:22 PM      Failed - Valid encounter within last 6 months    Recent Outpatient Visits           10 months ago Annual physical exam   [place] [facility] [place] Marsa PARAS, DO       Future Appointments             In 2 months Stoioff, Glendia BROCKS, MD [facility] [place]            Passed - Last BP in normal range    BP Readings from Last 1 Encounters:  09/14/23 114/77         Passed -  Last Heart Rate in normal range    Pulse Readings from Last 1 Encounters:  09/14/23 (!) 101

## 2024-07-09 ENCOUNTER — Telehealth: Payer: PRIVATE HEALTH INSURANCE | Admitting: Psychiatry

## 2024-07-09 ENCOUNTER — Encounter: Payer: Self-pay | Admitting: Psychiatry

## 2024-07-09 DIAGNOSIS — F3289 Other specified depressive episodes: Secondary | ICD-10-CM | POA: Diagnosis not present

## 2024-07-09 DIAGNOSIS — F5101 Primary insomnia: Secondary | ICD-10-CM

## 2024-07-09 DIAGNOSIS — F17210 Nicotine dependence, cigarettes, uncomplicated: Secondary | ICD-10-CM

## 2024-07-09 DIAGNOSIS — Z8659 Personal history of other mental and behavioral disorders: Secondary | ICD-10-CM

## 2024-07-09 DIAGNOSIS — F411 Generalized anxiety disorder: Secondary | ICD-10-CM

## 2024-07-09 DIAGNOSIS — F17200 Nicotine dependence, unspecified, uncomplicated: Secondary | ICD-10-CM

## 2024-07-09 DIAGNOSIS — Z87898 Personal history of other specified conditions: Secondary | ICD-10-CM

## 2024-07-09 MED ORDER — VARENICLINE TARTRATE 0.5 MG PO TABS: 0.5000 mg | ORAL_TABLET | Freq: Two times a day (BID) | ORAL | 2 refills | Status: AC

## 2024-07-09 NOTE — Progress Notes (Unsigned)
 Virtual Visit via Video Note  I connected with Robert [name] on 07/09/24 at 10:30 AM EST by a video enabled telemedicine application and verified that I am speaking with the correct person using two identifiers.  Location Provider Location : [facility] Patient Location : Home  Participants: Patient , Provider    I discussed the limitations of evaluation and management by telemedicine and the availability of in person appointments. The patient expressed understanding and agreed to proceed.   I discussed the assessment and treatment plan with the patient. The patient was provided an opportunity to ask questions and all were answered. The patient agreed with the plan and demonstrated an understanding of the instructions.   The patient was advised to call back or seek an in-person evaluation if the symptoms worsen or if the condition fails to improve as anticipated.  BH MD OP Progress Note  07/09/2024 11:37 AM Robert [name]  MRN:  [id number]  Chief Complaint:  Chief Complaint  Patient presents with   Follow-up   Depression   Anxiety   Medication Refill   Discussed the use of AI scribe software for clinical note transcription with the patient, who gave verbal consent to proceed.  History of Present Illness Robert [name] is a 44 year old Caucasian male who lives in [place], employed, has a history of GAD, insomnia, Tourette's disorder, GERD was evaluated by telemedicine today for a follow-up appointment.  He reports improvement since the last visit, describing his mood as a little better overall. Ongoing anxiety and stress, particularly related to job search challenges and recent rejection after interviews, continue to affect him and lead to feelings of disappointment and self-doubt. External stressors, such as work-related issues, contribute to his anxiety. Occasional difficulty relaxing, especially during long drives, results in physical tension and a tendency for his thoughts to spiral,  but he has not experienced panic attacks and feels able to manage these symptoms using coping strategies learned in therapy.  His current medication regimen includes venlafaxine  taken in the morning and mirtazapine  at bedtime which was started approximately 4 weeks ago. The mirtazapine  makes him sleepy and hence helps with his sleep however it makes him dizzy if he does not go to bed soon after taking it. He feels positive about the medication's impact but continues to experience tiredness. He also takes ashwagandha and L-theanine at night. He has not used clonazepam  since the last visit and keeps hydroxyzine  available as needed. He has maintained a better sleep schedule, with bedtime by 10-10:30 PM and waking before 7 AM. He uses CPAP nightly .  Weekly therapy sessions continue, focusing on managing his anxiety , and he notes that his free therapy sessions will end in mid-February.  He reports current tobacco use, stating he is still smoking but at a slightly reduced amount compared to last month. He describes using smoking as a break from stress. He previously used a vape to transition off smoking. He reports prior use of Chantix , which he found effective and did not experience side effects.  He is agreeable to trial of Chantix  again.  He denies any suicidality, homicidality or perceptual disturbances.  Visit Diagnosis:    ICD-10-CM   1. GAD (generalized anxiety disorder)  F41.1     2. Primary insomnia  F51.01     3. Other specified depressive episodes  F32.89    Depressive episodes with insufficent symptoms currently in remission    4. History of Gilles de la Tourette's syndrome  Z87.898     5.  Tobacco use disorder  F17.200 varenicline  (CHANTIX ) 0.5 MG tablet   Moderate      Past Psychiatric History: I have reviewed past psychiatric history from progress note on 09/29/2020.  Past trials of Lexapro , Wellbutrin , BuSpar , Ambien -noncompliant, venlafaxine .  Past Medical History:  Past  Medical History:  Diagnosis Date   ADHD    Anxiety    GERD (gastroesophageal reflux disease)    Wears contact lenses     Past Surgical History:  Procedure Laterality Date   ESOPHAGOGASTRODUODENOSCOPY (EGD) WITH PROPOFOL  N/A 06/14/2017   Procedure: ESOPHAGOGASTRODUODENOSCOPY (EGD) WITH PROPOFOL ;  Surgeon: Jinny Carmine, MD;  Location: [facility];  Service: Endoscopy;  Laterality: N/A;   SEPTOPLASTY  06/13/2011   WISDOM TOOTH EXTRACTION      Family Psychiatric History: I have reviewed family psychiatric history from progress note on 09/29/2020.  Family History:  Family History  Problem Relation Age of Onset   Depression Mother    Suicidality Mother    Cancer Maternal Grandfather 72       stomach   Esophageal cancer Maternal Grandfather 80   Depression Sister    Colon cancer Neg Hx    Prostate cancer Neg Hx    Diabetes Neg Hx    Hypertension Neg Hx    Hypercholesterolemia Neg Hx     Social History: I have reviewed social history from progress note on 09/29/2020. Social History   Socioeconomic History   Marital status: Divorced    Spouse name: Not on file   Number of children: 1   Years of education: Vocational   Highest education level: Associate degree: occupational, scientist, product/process development, or vocational program  Occupational History   Occupation: IT    Comment: Commuting to [employer]  Tobacco Use   Smoking status: Every Day    Current packs/day: 1.00    Average packs/day: 1 pack/day for 18.1 years (18.1 ttl pk-yrs)    Types: Cigarettes    Start date: 10/10/2001    Last attempt to quit: 10/10/2016   Smokeless tobacco: Former   Tobacco comments:    Quit using vaping.  Has cut back to 3/4 of a pack per day and trying to cut back to quit  Vaping Use   Vaping status: Former   Quit date: 04/14/2018  Substance and Sexual Activity   Alcohol use: No   Drug use: No   Sexual activity: Yes    Birth control/protection: Surgical    Comment: Vasectomy 2013  Other Topics Concern    Not on file  Social History Narrative   Not on file   Social Drivers of Health   Tobacco Use: High Risk (07/09/2024)   Patient History    Smoking Tobacco Use: Every Day    Smokeless Tobacco Use: Former    Passive Exposure: Not on Actuary Strain: Low Risk (08/10/2023)   Overall Financial Resource Strain (CARDIA)    Difficulty of Paying Living Expenses: Not very hard  Food Insecurity: Unknown (08/10/2023)   Hunger Vital Sign    Worried About Running Out of Food in the Last Year: Never true    Ran Out of Food in the Last Year: Patient declined  Transportation Needs: No Transportation Needs (08/10/2023)   PRAPARE - Administrator, Civil Service (Medical): No    Lack of Transportation (Non-Medical): No  Physical Activity: Insufficiently Active (08/10/2023)   Exercise Vital Sign    Days of Exercise per Week: 2 days    Minutes of Exercise per Session: 20  min  Stress: Stress Concern Present (08/10/2023)   [employer] of Occupational Health - Occupational Stress Questionnaire    Feeling of Stress : Rather much  Social Connections: Unknown (08/10/2023)   Social Connection and Isolation Panel    Frequency of Communication with Friends and Family: More than three times a week    Frequency of Social Gatherings with Friends and Family: Three times a week    Attends Religious Services: Patient declined    Active Member of Clubs or Organizations: Yes    Attends Banker Meetings: 1 to 4 times per year    Marital Status: Divorced  Depression (PHQ2-9): Medium Risk (02/26/2024)   Depression (PHQ2-9)    PHQ-2 Score: 6  Alcohol Screen: Low Risk (07/08/2021)   Alcohol Screen    Last Alcohol Screening Score (AUDIT): 0  Housing: Unknown (08/10/2023)   Housing Stability Vital Sign    Unable to Pay for Housing in the Last Year: No    Number of Times Moved in the Last Year: Not on file    Homeless in the Last Year: No  Utilities: Not At Risk (07/09/2023)    Received from [facility] Utilities    Threatened with loss of utilities: No  Health Literacy: Not on file    Allergies: Allergies[1]  Metabolic Disorder Labs: Lab Results  Component Value Date   HGBA1C 6.4 (H) 08/06/2023   MPG 137 08/06/2023   MPG 128 08/09/2022   Lab Results  Component Value Date   PROLACTIN 8.9 05/16/2018   Lab Results  Component Value Date   CHOL 191 08/06/2023   TRIG 374 (H) 08/06/2023   HDL 34 (L) 08/06/2023   CHOLHDL 5.6 (H) 08/06/2023   LDLCALC 107 (H) 08/06/2023   LDLCALC 93 08/09/2022   Lab Results  Component Value Date   TSH 1.42 08/06/2023   TSH 1.26 08/09/2022    Therapeutic Level Labs: No results found for: LITHIUM No results found for: VALPROATE No results found for: CBMZ  Current Medications: Current Outpatient Medications  Medication Sig Dispense Refill   ASHWAGANDHA PO Take by mouth.     clonazePAM  (KLONOPIN ) 0.5 MG tablet Take 0.5-1 tablets (0.25-0.5 mg total) by mouth daily as needed for anxiety. Please limit use 15 tablet 0   hydrOXYzine  (VISTARIL ) 25 MG capsule Take 1-2 capsules (25-50 mg total) by mouth at bedtime as needed for anxiety (sleep). 60 capsule 2   metoprolol  succinate (TOPROL -XL) 25 MG 24 hr tablet Take 1 tablet (25 mg total) by mouth daily. 90 tablet 1   mirtazapine  (REMERON ) 7.5 MG tablet Take 1 tablet (7.5 mg total) by mouth at bedtime. 30 tablet 1   NEEDLE, DISP, 18 G (BD DISP NEEDLES) 18G X 1-1/2 MISC Use 18G to draw up Testosterone  medication 50 each 0   NEEDLE, DISP, 21 G (BD ECLIPSE NEEDLE) 21G X 1-1/2 MISC Use 21G needle to inject Testosterone  medication. 50 each 0   nystatin  (MYCOSTATIN /NYSTOP ) powder Apply 1 Application topically 3 (three) times daily. 60 g 0   Syringe, Disposable, (2-3CC SYRINGE) 3 ML MISC Use as directed 100 each 0   Testosterone  1.62 % GEL APPLY ONE PUMP TO EACH SHOULDER TOPICALLY ONE TIME DAILY 75 g 3   tirzepatide (ZEPBOUND) 2.5 MG/0.5ML injection  vial Inject 2.5 mg into the skin.     varenicline  (CHANTIX ) 0.5 MG tablet Take 1 tablet (0.5 mg total) by mouth 2 (two) times daily. 60 tablet 2   venlafaxine  XR (  EFFEXOR -XR) 150 MG 24 hr capsule TAKE ONE CAPSULE BY MOUTH DAILY WITH BREAKFAST 90 capsule 1   No current facility-administered medications for this visit.     Musculoskeletal: Strength & Muscle Tone: UTA Gait & Station: Seated Patient leans: N/A  Psychiatric Specialty Exam: Review of Systems  Psychiatric/Behavioral:  The patient is nervous/anxious.     There were no vitals taken for this visit.There is no height or weight on file to calculate BMI.  General Appearance: Casual  Eye Contact:  Fair  Speech:  Normal Rate  Volume:  Normal  Mood:  Anxious  Affect:  Appropriate  Thought Process:  Goal Directed and Descriptions of Associations: Intact  Orientation:  Full (Time, Place, and Person)  Thought Content: Logical   Suicidal Thoughts:  No  Homicidal Thoughts:  No  Memory:  Immediate;   Fair Recent;   Fair Remote;   Fair  Judgement:  Fair  Insight:  Fair  Psychomotor Activity:  Normal  Concentration:  Concentration: Fair and Attention Span: Fair  Recall:  [employer] of Knowledge: Fair  Language: Fair  Akathisia:  No  Handed:  Right  AIMS (if indicated): not done  Assets:  Communication Skills Desire for Improvement Housing Social Support Transportation  ADL's:  Intact  Cognition: WNL  Sleep:  improving   Screenings: AIMS    Flowsheet Row Video Visit from 11/23/2021 in [facility]  AIMS Total Score 0   GAD-7    Flowsheet Row Office Visit from 02/26/2024 in [facility] Office Visit from 08/13/2023 in [facility] Office Visit from 05/02/2023 in [facility] Office Visit from 02/02/2023 in [facility] Video Visit from 10/16/2022 in  [facility]  Total GAD-7 Score 5 12 10 8 9    PHQ2-9    Flowsheet Row Office Visit from 02/26/2024 in [facility] Office Visit from 08/13/2023 in [facility] Office Visit from 05/02/2023 in [facility] Video Visit from 02/19/2023 in [facility] Office Visit from 02/02/2023 in [facility]  PHQ-2 Total Score 2 2 2 1 3   PHQ-9 Total Score 6 6 10  -- 9   Flowsheet Row Video Visit from 06/09/2024 in [facility] Office Visit from 02/26/2024 in [facility] Video Visit from 11/30/2023 in [facility]  C-SSRS RISK CATEGORY No Risk No Risk No Risk     Assessment and Plan: Rielly Corlett is a 44 year old Caucasian male who presented for follow-up appointment, discussed assessment and plan as noted below.  1. GAD (generalized anxiety disorder)-improving Currently with improvement on the current medication regimen Continue Mirtazapine  as prescribed Continue Venlafaxine  150 mg daily Continue Hydroxyzine  25 to 50 mg at bedtime as needed Continue Clonazepam  0.5 mg as needed for severe anxiety attacks only. Reviewed [place] PMP AWARxE Encouraged to continue CBT with EAP.   2. Primary insomnia-improving Currently reports improvement on the current medication regimen although the mirtazapine  does make him groggy the next day.  Encouraged to take the mirtazapine  at least 30 minutes earlier than how it is taken now. Continue Mirtazapine  7.5 mg at bedtime Continue CPAP therapy Continue Hydroxyzine  as needed at bedtime.  3. Other specified depressive episodes-improving Reports mood symptoms is improving Continue CBT Continue  Mirtazapine  and Venlafaxine  as prescribed  4. History of  Gilles de la Tourette's syndrome Will monitor closely  5. Tobacco use disorder-improving Interested in smoking cessation and reports Chantix  may have helped previously. Start Chantix  0.5 mg twice a day Provided counseling for 1 minute.  Follow-up Follow-up in clinic in 4 weeks or sooner if needed.   Collaboration of Care: Collaboration of Care: Referral or follow-up with counselor/therapist AEB encouraged to continue CBT.  Patient/Guardian was advised Release of Information must be obtained prior to any record release in order to collaborate their care with an outside provider. Patient/Guardian was advised if they have not already done so to contact the registration department to sign all necessary forms in order for us  to release information regarding their care.   Consent: Patient/Guardian gives verbal consent for treatment and assignment of benefits for services provided during this visit. Patient/Guardian expressed understanding and agreed to proceed.   This note was generated in part or whole with voice recognition software. Voice recognition is usually quite accurate but there are transcription errors that can and very often do occur. I apologize for any typographical errors that were not detected and corrected.    Shmiel Morton, MD 07/09/2024, 11:37 AM     [1] No Known Allergies

## 2024-08-13 ENCOUNTER — Telehealth: Admitting: Psychiatry

## 2024-08-29 ENCOUNTER — Encounter: Admitting: Family Medicine

## 2024-09-15 ENCOUNTER — Other Ambulatory Visit

## 2024-09-19 ENCOUNTER — Ambulatory Visit: Admitting: Urology
